# Patient Record
Sex: Male | Born: 1970 | Race: White | Hispanic: No | Marital: Single | Smoking: Current every day smoker
Health system: Southern US, Community
[De-identification: ages and names within clinical notes are randomized; demographics above are authoritative.]

## PROBLEM LIST (undated history)

## (undated) DIAGNOSIS — F32A Depression, unspecified: Secondary | ICD-10-CM

## (undated) DIAGNOSIS — F329 Major depressive disorder, single episode, unspecified: Secondary | ICD-10-CM

## (undated) DIAGNOSIS — I509 Heart failure, unspecified: Secondary | ICD-10-CM

## (undated) DIAGNOSIS — G4733 Obstructive sleep apnea (adult) (pediatric): Secondary | ICD-10-CM

## (undated) DIAGNOSIS — I2510 Atherosclerotic heart disease of native coronary artery without angina pectoris: Secondary | ICD-10-CM

## (undated) DIAGNOSIS — I10 Essential (primary) hypertension: Secondary | ICD-10-CM

## (undated) DIAGNOSIS — Z8679 Personal history of other diseases of the circulatory system: Secondary | ICD-10-CM

## (undated) DIAGNOSIS — K219 Gastro-esophageal reflux disease without esophagitis: Secondary | ICD-10-CM

## (undated) DIAGNOSIS — F419 Anxiety disorder, unspecified: Secondary | ICD-10-CM

## (undated) DIAGNOSIS — E785 Hyperlipidemia, unspecified: Secondary | ICD-10-CM

## (undated) HISTORY — DX: Heart failure, unspecified: I50.9

## (undated) HISTORY — DX: Depression, unspecified: F32.A

## (undated) HISTORY — PX: SHOULDER SURGERY: SHX246

## (undated) HISTORY — PX: NASAL TURBINATE REDUCTION: SHX2072

## (undated) HISTORY — DX: Major depressive disorder, single episode, unspecified: F32.9

## (undated) HISTORY — DX: Hyperlipidemia, unspecified: E78.5

## (undated) HISTORY — DX: Essential (primary) hypertension: I10

## (undated) HISTORY — DX: Gastro-esophageal reflux disease without esophagitis: K21.9

## (undated) HISTORY — DX: Obstructive sleep apnea (adult) (pediatric): G47.33

## (undated) HISTORY — DX: Personal history of other diseases of the circulatory system: Z86.79

## (undated) HISTORY — DX: Anxiety disorder, unspecified: F41.9

## (undated) HISTORY — DX: Depression, unspecified: F41.9

## (undated) HISTORY — PX: ANKLE SURGERY: SHX546

---

## 1998-07-07 ENCOUNTER — Ambulatory Visit (HOSPITAL_COMMUNITY): Admission: RE | Admit: 1998-07-07 | Discharge: 1998-07-07 | Payer: Self-pay | Admitting: Specialist

## 1998-07-07 ENCOUNTER — Encounter: Payer: Self-pay | Admitting: Specialist

## 2000-04-29 ENCOUNTER — Ambulatory Visit (HOSPITAL_COMMUNITY): Admission: RE | Admit: 2000-04-29 | Discharge: 2000-04-29 | Payer: Self-pay | Admitting: Internal Medicine

## 2000-05-14 ENCOUNTER — Ambulatory Visit (HOSPITAL_COMMUNITY): Admission: RE | Admit: 2000-05-14 | Discharge: 2000-05-14 | Payer: Self-pay | Admitting: Internal Medicine

## 2000-05-14 ENCOUNTER — Encounter: Payer: Self-pay | Admitting: Internal Medicine

## 2000-06-25 ENCOUNTER — Encounter: Payer: Self-pay | Admitting: Internal Medicine

## 2000-06-25 ENCOUNTER — Ambulatory Visit (HOSPITAL_COMMUNITY): Admission: RE | Admit: 2000-06-25 | Discharge: 2000-06-25 | Payer: Self-pay | Admitting: Internal Medicine

## 2000-10-25 ENCOUNTER — Emergency Department (HOSPITAL_COMMUNITY): Admission: EM | Admit: 2000-10-25 | Discharge: 2000-10-25 | Payer: Self-pay | Admitting: *Deleted

## 2000-10-25 ENCOUNTER — Encounter: Payer: Self-pay | Admitting: *Deleted

## 2000-11-24 ENCOUNTER — Emergency Department (HOSPITAL_COMMUNITY): Admission: EM | Admit: 2000-11-24 | Discharge: 2000-11-24 | Payer: Self-pay | Admitting: Emergency Medicine

## 2001-08-24 ENCOUNTER — Encounter: Payer: Self-pay | Admitting: Internal Medicine

## 2001-08-24 ENCOUNTER — Ambulatory Visit (HOSPITAL_COMMUNITY): Admission: RE | Admit: 2001-08-24 | Discharge: 2001-08-24 | Payer: Self-pay | Admitting: Internal Medicine

## 2001-08-27 ENCOUNTER — Ambulatory Visit (HOSPITAL_COMMUNITY): Admission: RE | Admit: 2001-08-27 | Discharge: 2001-08-27 | Payer: Self-pay | Admitting: Internal Medicine

## 2001-08-27 ENCOUNTER — Encounter: Payer: Self-pay | Admitting: Internal Medicine

## 2001-10-22 ENCOUNTER — Encounter: Payer: Self-pay | Admitting: Gastroenterology

## 2001-10-22 ENCOUNTER — Ambulatory Visit (HOSPITAL_COMMUNITY): Admission: RE | Admit: 2001-10-22 | Discharge: 2001-10-22 | Payer: Self-pay | Admitting: Gastroenterology

## 2002-04-20 ENCOUNTER — Ambulatory Visit (HOSPITAL_COMMUNITY): Admission: RE | Admit: 2002-04-20 | Discharge: 2002-04-20 | Payer: Self-pay | Admitting: Family Medicine

## 2002-04-20 ENCOUNTER — Encounter: Payer: Self-pay | Admitting: Family Medicine

## 2002-05-11 ENCOUNTER — Ambulatory Visit (HOSPITAL_COMMUNITY): Admission: RE | Admit: 2002-05-11 | Discharge: 2002-05-11 | Payer: Self-pay | Admitting: Family Medicine

## 2002-05-11 ENCOUNTER — Encounter: Payer: Self-pay | Admitting: Family Medicine

## 2002-06-22 ENCOUNTER — Encounter: Payer: Self-pay | Admitting: Emergency Medicine

## 2002-06-22 ENCOUNTER — Emergency Department (HOSPITAL_COMMUNITY): Admission: EM | Admit: 2002-06-22 | Discharge: 2002-06-22 | Payer: Self-pay | Admitting: Emergency Medicine

## 2002-11-26 ENCOUNTER — Emergency Department (HOSPITAL_COMMUNITY): Admission: EM | Admit: 2002-11-26 | Discharge: 2002-11-26 | Payer: Self-pay | Admitting: Emergency Medicine

## 2003-05-30 ENCOUNTER — Ambulatory Visit (HOSPITAL_COMMUNITY): Admission: RE | Admit: 2003-05-30 | Discharge: 2003-05-30 | Payer: Self-pay | Admitting: Internal Medicine

## 2003-06-01 ENCOUNTER — Ambulatory Visit (HOSPITAL_COMMUNITY): Admission: RE | Admit: 2003-06-01 | Discharge: 2003-06-01 | Payer: Self-pay | Admitting: Internal Medicine

## 2003-06-10 ENCOUNTER — Emergency Department (HOSPITAL_COMMUNITY): Admission: EM | Admit: 2003-06-10 | Discharge: 2003-06-10 | Payer: Self-pay | Admitting: Emergency Medicine

## 2003-11-11 ENCOUNTER — Emergency Department (HOSPITAL_COMMUNITY): Admission: EM | Admit: 2003-11-11 | Discharge: 2003-11-12 | Payer: Self-pay | Admitting: *Deleted

## 2004-02-04 ENCOUNTER — Encounter: Payer: Self-pay | Admitting: Emergency Medicine

## 2004-02-04 ENCOUNTER — Ambulatory Visit: Payer: Self-pay | Admitting: Cardiology

## 2004-02-04 ENCOUNTER — Inpatient Hospital Stay (HOSPITAL_COMMUNITY): Admission: AD | Admit: 2004-02-04 | Discharge: 2004-02-06 | Payer: Self-pay | Admitting: Cardiology

## 2004-02-15 ENCOUNTER — Ambulatory Visit: Payer: Self-pay | Admitting: Internal Medicine

## 2004-03-01 ENCOUNTER — Ambulatory Visit: Payer: Self-pay | Admitting: *Deleted

## 2004-03-20 ENCOUNTER — Ambulatory Visit: Payer: Self-pay | Admitting: Internal Medicine

## 2004-03-20 ENCOUNTER — Observation Stay (HOSPITAL_COMMUNITY): Admission: EM | Admit: 2004-03-20 | Discharge: 2004-03-21 | Payer: Self-pay | Admitting: Emergency Medicine

## 2004-03-21 ENCOUNTER — Encounter: Payer: Self-pay | Admitting: Cardiology

## 2004-03-28 ENCOUNTER — Ambulatory Visit: Payer: Self-pay | Admitting: Cardiology

## 2004-04-11 ENCOUNTER — Ambulatory Visit: Payer: Self-pay

## 2004-04-11 ENCOUNTER — Ambulatory Visit: Payer: Self-pay | Admitting: Internal Medicine

## 2004-04-21 ENCOUNTER — Ambulatory Visit: Admission: RE | Admit: 2004-04-21 | Discharge: 2004-04-21 | Payer: Self-pay | Admitting: Family Medicine

## 2004-04-23 ENCOUNTER — Ambulatory Visit: Payer: Self-pay | Admitting: Cardiology

## 2004-04-24 ENCOUNTER — Ambulatory Visit (HOSPITAL_COMMUNITY): Admission: RE | Admit: 2004-04-24 | Discharge: 2004-04-24 | Payer: Self-pay | Admitting: Internal Medicine

## 2004-04-24 ENCOUNTER — Encounter: Payer: Self-pay | Admitting: Cardiology

## 2004-05-02 ENCOUNTER — Ambulatory Visit: Payer: Self-pay | Admitting: *Deleted

## 2004-05-04 ENCOUNTER — Emergency Department (HOSPITAL_COMMUNITY): Admission: EM | Admit: 2004-05-04 | Discharge: 2004-05-04 | Payer: Self-pay | Admitting: Emergency Medicine

## 2004-05-10 ENCOUNTER — Ambulatory Visit: Payer: Self-pay | Admitting: Pulmonary Disease

## 2004-05-23 ENCOUNTER — Emergency Department (HOSPITAL_COMMUNITY): Admission: EM | Admit: 2004-05-23 | Discharge: 2004-05-23 | Payer: Self-pay | Admitting: Emergency Medicine

## 2004-05-24 ENCOUNTER — Emergency Department (HOSPITAL_COMMUNITY): Admission: EM | Admit: 2004-05-24 | Discharge: 2004-05-24 | Payer: Self-pay | Admitting: *Deleted

## 2004-05-25 ENCOUNTER — Emergency Department (HOSPITAL_COMMUNITY): Admission: EM | Admit: 2004-05-25 | Discharge: 2004-05-25 | Payer: Self-pay | Admitting: Emergency Medicine

## 2004-06-08 ENCOUNTER — Ambulatory Visit: Payer: Self-pay | Admitting: *Deleted

## 2004-06-11 ENCOUNTER — Ambulatory Visit (HOSPITAL_COMMUNITY): Admission: RE | Admit: 2004-06-11 | Discharge: 2004-06-11 | Payer: Self-pay | Admitting: *Deleted

## 2004-06-11 ENCOUNTER — Ambulatory Visit: Payer: Self-pay | Admitting: *Deleted

## 2004-06-19 ENCOUNTER — Ambulatory Visit: Payer: Self-pay | Admitting: *Deleted

## 2004-06-24 ENCOUNTER — Emergency Department (HOSPITAL_COMMUNITY): Admission: EM | Admit: 2004-06-24 | Discharge: 2004-06-24 | Payer: Self-pay | Admitting: Emergency Medicine

## 2004-06-26 ENCOUNTER — Emergency Department (HOSPITAL_COMMUNITY): Admission: EM | Admit: 2004-06-26 | Discharge: 2004-06-26 | Payer: Self-pay | Admitting: Emergency Medicine

## 2004-06-28 ENCOUNTER — Ambulatory Visit: Payer: Self-pay | Admitting: Critical Care Medicine

## 2004-07-12 ENCOUNTER — Ambulatory Visit: Payer: Self-pay | Admitting: Internal Medicine

## 2004-07-23 ENCOUNTER — Ambulatory Visit: Payer: Self-pay | Admitting: Pulmonary Disease

## 2004-08-01 ENCOUNTER — Ambulatory Visit (HOSPITAL_COMMUNITY): Admission: RE | Admit: 2004-08-01 | Discharge: 2004-08-02 | Payer: Self-pay | Admitting: Otolaryngology

## 2004-08-01 ENCOUNTER — Encounter (INDEPENDENT_AMBULATORY_CARE_PROVIDER_SITE_OTHER): Payer: Self-pay | Admitting: *Deleted

## 2004-11-30 ENCOUNTER — Emergency Department (HOSPITAL_COMMUNITY): Admission: EM | Admit: 2004-11-30 | Discharge: 2004-11-30 | Payer: Self-pay | Admitting: Emergency Medicine

## 2004-12-05 ENCOUNTER — Ambulatory Visit (HOSPITAL_COMMUNITY): Admission: RE | Admit: 2004-12-05 | Discharge: 2004-12-05 | Payer: Self-pay | Admitting: *Deleted

## 2004-12-05 ENCOUNTER — Ambulatory Visit: Payer: Self-pay | Admitting: Cardiology

## 2004-12-06 ENCOUNTER — Ambulatory Visit: Payer: Self-pay | Admitting: *Deleted

## 2005-01-15 ENCOUNTER — Ambulatory Visit: Payer: Self-pay | Admitting: Internal Medicine

## 2005-01-17 ENCOUNTER — Encounter (INDEPENDENT_AMBULATORY_CARE_PROVIDER_SITE_OTHER): Payer: Self-pay | Admitting: Internal Medicine

## 2005-01-17 ENCOUNTER — Ambulatory Visit: Payer: Self-pay | Admitting: Internal Medicine

## 2005-01-17 LAB — CONVERTED CEMR LAB: TSH: 1.14 microintl units/mL

## 2005-01-22 ENCOUNTER — Ambulatory Visit: Payer: Self-pay | Admitting: *Deleted

## 2005-01-22 ENCOUNTER — Ambulatory Visit (HOSPITAL_COMMUNITY): Admission: RE | Admit: 2005-01-22 | Discharge: 2005-01-22 | Payer: Self-pay | Admitting: Internal Medicine

## 2005-01-24 ENCOUNTER — Ambulatory Visit: Payer: Self-pay | Admitting: Critical Care Medicine

## 2005-02-01 ENCOUNTER — Ambulatory Visit: Payer: Self-pay | Admitting: *Deleted

## 2005-02-08 ENCOUNTER — Ambulatory Visit: Payer: Self-pay | Admitting: Internal Medicine

## 2005-02-18 ENCOUNTER — Encounter (INDEPENDENT_AMBULATORY_CARE_PROVIDER_SITE_OTHER): Payer: Self-pay | Admitting: Internal Medicine

## 2005-02-18 ENCOUNTER — Ambulatory Visit (HOSPITAL_COMMUNITY): Admission: RE | Admit: 2005-02-18 | Discharge: 2005-02-18 | Payer: Self-pay | Admitting: Internal Medicine

## 2005-03-01 ENCOUNTER — Ambulatory Visit: Admission: RE | Admit: 2005-03-01 | Discharge: 2005-03-01 | Payer: Self-pay | Admitting: Internal Medicine

## 2005-03-12 ENCOUNTER — Ambulatory Visit: Payer: Self-pay | Admitting: Pulmonary Disease

## 2005-04-08 ENCOUNTER — Emergency Department (HOSPITAL_COMMUNITY): Admission: EM | Admit: 2005-04-08 | Discharge: 2005-04-08 | Payer: Self-pay | Admitting: Emergency Medicine

## 2005-05-17 ENCOUNTER — Ambulatory Visit: Payer: Self-pay | Admitting: Internal Medicine

## 2005-07-26 ENCOUNTER — Ambulatory Visit: Payer: Self-pay | Admitting: Internal Medicine

## 2005-10-07 ENCOUNTER — Ambulatory Visit: Payer: Self-pay | Admitting: Internal Medicine

## 2005-10-18 ENCOUNTER — Emergency Department (HOSPITAL_COMMUNITY): Admission: EM | Admit: 2005-10-18 | Discharge: 2005-10-18 | Payer: Self-pay | Admitting: Emergency Medicine

## 2005-11-22 ENCOUNTER — Encounter: Payer: Self-pay | Admitting: Internal Medicine

## 2005-11-22 DIAGNOSIS — F319 Bipolar disorder, unspecified: Secondary | ICD-10-CM

## 2005-11-22 DIAGNOSIS — S82899A Other fracture of unspecified lower leg, initial encounter for closed fracture: Secondary | ICD-10-CM | POA: Insufficient documentation

## 2005-11-22 DIAGNOSIS — F329 Major depressive disorder, single episode, unspecified: Secondary | ICD-10-CM

## 2005-11-22 DIAGNOSIS — K259 Gastric ulcer, unspecified as acute or chronic, without hemorrhage or perforation: Secondary | ICD-10-CM | POA: Insufficient documentation

## 2005-11-22 DIAGNOSIS — R609 Edema, unspecified: Secondary | ICD-10-CM

## 2005-11-22 DIAGNOSIS — F3289 Other specified depressive episodes: Secondary | ICD-10-CM | POA: Insufficient documentation

## 2005-11-22 DIAGNOSIS — G43909 Migraine, unspecified, not intractable, without status migrainosus: Secondary | ICD-10-CM | POA: Insufficient documentation

## 2005-11-22 DIAGNOSIS — M129 Arthropathy, unspecified: Secondary | ICD-10-CM | POA: Insufficient documentation

## 2005-12-18 ENCOUNTER — Ambulatory Visit: Payer: Self-pay | Admitting: Orthopedic Surgery

## 2006-01-20 ENCOUNTER — Emergency Department (HOSPITAL_COMMUNITY): Admission: EM | Admit: 2006-01-20 | Discharge: 2006-01-21 | Payer: Self-pay | Admitting: Emergency Medicine

## 2006-01-23 ENCOUNTER — Ambulatory Visit: Payer: Self-pay | Admitting: Cardiology

## 2006-01-28 ENCOUNTER — Ambulatory Visit: Payer: Self-pay | Admitting: Cardiology

## 2006-02-19 ENCOUNTER — Ambulatory Visit: Payer: Self-pay | Admitting: Cardiology

## 2006-05-09 ENCOUNTER — Ambulatory Visit: Payer: Self-pay | Admitting: Cardiology

## 2006-08-29 ENCOUNTER — Emergency Department (HOSPITAL_COMMUNITY): Admission: EM | Admit: 2006-08-29 | Discharge: 2006-08-29 | Payer: Self-pay | Admitting: Emergency Medicine

## 2006-09-10 ENCOUNTER — Emergency Department (HOSPITAL_COMMUNITY): Admission: EM | Admit: 2006-09-10 | Discharge: 2006-09-11 | Payer: Self-pay | Admitting: Emergency Medicine

## 2006-09-11 ENCOUNTER — Ambulatory Visit: Payer: Self-pay | Admitting: *Deleted

## 2006-09-11 ENCOUNTER — Inpatient Hospital Stay (HOSPITAL_COMMUNITY): Admission: AD | Admit: 2006-09-11 | Discharge: 2006-09-12 | Payer: Self-pay | Admitting: *Deleted

## 2006-12-22 ENCOUNTER — Encounter: Payer: Self-pay | Admitting: Internal Medicine

## 2007-09-04 ENCOUNTER — Emergency Department (HOSPITAL_COMMUNITY): Admission: EM | Admit: 2007-09-04 | Discharge: 2007-09-04 | Payer: Self-pay | Admitting: Emergency Medicine

## 2008-10-13 ENCOUNTER — Encounter: Payer: Self-pay | Admitting: Orthopedic Surgery

## 2008-11-23 ENCOUNTER — Emergency Department: Payer: Self-pay | Admitting: Emergency Medicine

## 2009-03-25 ENCOUNTER — Emergency Department (HOSPITAL_COMMUNITY): Admission: EM | Admit: 2009-03-25 | Discharge: 2009-03-25 | Payer: Self-pay | Admitting: Emergency Medicine

## 2009-03-26 ENCOUNTER — Emergency Department (HOSPITAL_COMMUNITY): Admission: EM | Admit: 2009-03-26 | Discharge: 2009-03-26 | Payer: Self-pay | Admitting: Emergency Medicine

## 2009-03-28 ENCOUNTER — Emergency Department (HOSPITAL_COMMUNITY)
Admission: EM | Admit: 2009-03-28 | Discharge: 2009-03-28 | Payer: Self-pay | Source: Home / Self Care | Admitting: Emergency Medicine

## 2009-04-01 ENCOUNTER — Emergency Department: Payer: Self-pay | Admitting: Unknown Physician Specialty

## 2009-04-02 ENCOUNTER — Emergency Department (HOSPITAL_COMMUNITY): Admission: EM | Admit: 2009-04-02 | Discharge: 2009-04-02 | Payer: Self-pay | Admitting: Emergency Medicine

## 2009-06-18 ENCOUNTER — Emergency Department (HOSPITAL_COMMUNITY)
Admission: EM | Admit: 2009-06-18 | Discharge: 2009-06-18 | Payer: Self-pay | Source: Home / Self Care | Admitting: Emergency Medicine

## 2009-06-22 ENCOUNTER — Emergency Department: Payer: Self-pay | Admitting: Emergency Medicine

## 2009-11-07 ENCOUNTER — Encounter: Admission: RE | Admit: 2009-11-07 | Discharge: 2009-11-07 | Payer: Self-pay | Admitting: Orthopedic Surgery

## 2010-02-11 ENCOUNTER — Encounter: Payer: Self-pay | Admitting: Orthopedic Surgery

## 2010-02-19 ENCOUNTER — Encounter: Payer: Self-pay | Admitting: Orthopedic Surgery

## 2010-02-27 ENCOUNTER — Encounter: Payer: Self-pay | Admitting: Orthopedic Surgery

## 2010-02-27 ENCOUNTER — Ambulatory Visit (INDEPENDENT_AMBULATORY_CARE_PROVIDER_SITE_OTHER): Payer: Self-pay | Admitting: Orthopedic Surgery

## 2010-02-27 DIAGNOSIS — G5790 Unspecified mononeuropathy of unspecified lower limb: Secondary | ICD-10-CM | POA: Insufficient documentation

## 2010-03-02 ENCOUNTER — Encounter: Payer: Self-pay | Admitting: Orthopedic Surgery

## 2010-03-08 NOTE — Assessment & Plan Note (Signed)
Summary: PAIN W/"KNOT" RT LEG/NEEDS XRAY/APPR'D PER DR H/SEEN IN PREVI...   Vital Signs:  Patient profile:   40 year old male Height:      72 inches Weight:      228 pounds Pulse rate:   80 / minute Resp:     18 per minute  Vitals Entered By: Fuller Canada MD (February 27, 2010 1:49 PM)  Visit Type:  new patient Referring Provider:  self Primary Provider:  na  CC:  right knee pain.  History of Present Illness: I saw [name] in the office today for an initial visit.  He is a 40 years old man with the complaint of: RIGHT knee pain  DOI 08/31/09.  The patient gives a history of a workers compensation related injury to his RIGHT shoulder which required surgery by Dr. Ranell Patrick in [facility] back on the day listed.  He is employed at the [employer] company in [place].  He describes a 1000 pound roller falling on the back of his leg and pinning his knee against a tire of a truck.  He says he reported this at the time but it was not included in the workers compensation case.  He is represented by a law firm and a hearing is scheduled soon.  He was seen once a primary care for evaluation of his RIGHT knee where he is complaining of posterior popliteal space numbness tingling and pain which radiates into his calf and down towards the lower part of his leg.  He says he feels weakness in his knee by the end of the day and his knee will swell and the back as well.  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  He says that he does not have any back pain and the pain does not radiate past the popliteal space.  Tylenol does not help.  Note: He has pictures of his calf and knee area at the time of injury and he says that it was reported.  He has a history of heartburn otherwise review of systems are negative except for the tingling described.  He is currently on Nexium  He status post  RIGHT ankle reconstruction for instability after another injury approximately 10-12 years ago  He is currently divorced.  He smokes a pack of cigarettes a day does not drink does not use any street drugs and he completed his education through the 10th grade     Allergies: 1)  ! * Tricor 2)  ! Zocor  Past History:  Past Medical History: Last updated: 11/22/2005 Anxiety Congestive heart failure Depression GERD Hyperlipidemia Hypertension  Family History: Last updated: 12/22/2006 father-55 mother-51 sister-35 brother-37 brohter-30 daughter-x2  Social History: Last updated: 02/27/2010 divorced grading work Current Smoker-1ppd  no alcohol no caffeine 10th grade ed  Past Surgical History: CARDIAC CATH 2006 ABLATION 2006 VPPP  WITH TURBENATE REDUCTION 2006 rt shoulder rt ankle  Social History: divorced grading work Current Smoker-1ppd  no alcohol no caffeine 10th grade ed  Review of Systems Constitutional:  Denies weight loss, weight gain, fever, chills, and fatigue. Cardiovascular:  Denies chest pain, palpitations, fainting, and murmurs. Respiratory:  Denies short of breath, wheezing, couch, tightness, pain on inspiration, and snoring . Gastrointestinal:  Complains of heartburn; denies nausea, vomiting, diarrhea, constipation, and blood in your stools. Genitourinary:  Denies frequency, urgency, difficulty urinating, painful urination, flank pain, and bleeding in urine. Neurologic:  Complains of tingling; denies  numbness, unsteady gait, dizziness, tremors, and seizure. Musculoskeletal:  Denies joint pain, swelling, instability, stiffness, redness, heat, and muscle pain. Endocrine:  Denies excessive thirst, exessive urination, and heat or cold intolerance. Psychiatric:  Denies nervousness, depression, anxiety, and hallucinations. Skin:  Denies changes in the skin, poor healing, rash, itching, and redness. HEENT:  Denies blurred or double vision, eye pain,  redness, and watering. Immunology:  Denies seasonal allergies, sinus problems, and allergic to bee stings. Hemoatologic:  Denies easy bleeding and brusing.  Physical Exam  Skin:  lateral incision noted over the RIGHT ankle and foot from previous surgery has a positive Tinel's. Psych:  alert and cooperative; normal mood and affect; normal attention span and concentration   Knee Exam  General:    Well-developed, well-nourished, normal body habitus; no deformities, normal grooming.  Gait:    Normal heel-toe gait pattern bilaterally.    Skin:    Intact, no scars, lesions, rashes, cafe au lait spots, or bruising.    Inspection:     No deformity, ecchymosis or swelling.   Palpation:    he is tender to palpation in the popliteal space.  This does not involve the medial or lateral hamstring group.  Vascular:    There was no swelling or varicose veins. The pulses and temperature are normal. There was no edema or tenderness.  Sensory:    sensory changes are noted in the foot none in the calf or lateral leg  Motor:    Motor strength 5/5 bilaterally for quadriceps, hamstrings, ankle dorsiflexion, and ankle plantar flexion.    Reflexes:    Normal and symmetric patellar and Achilles reflexes bilaterally.    Knee Exam:    Right:    Inspection:  Normal    Palpation:  Abnormal    Stability:  stable    Tenderness:  popliteal space    he has difficulty straightening his leg when his hip is in 90 of flexion but it does not reproduce or radicular type pain just pain in the back of the calf    Range of Motion:       Flexion-Active: full    Left:    Inspection:  Normal    Palpation:  Normal    Range of Motion:       Flexion-Active: full  Anterior drawer:    Right negative Posterior drawer:    Right negative Lachman :    Right negative MCL:    Right negative LCL:    Right negative    Impression & Recommendations:  Problem # 1:  UNSPECIFIED MONONEURITIS OF LOWER LIMB  (ICD-355.8) Assessment New  I think the patient has had a neuropraxia to the tibial nerve and the popliteal space.  Recommend nerve conduction study to evaluate his tibial nerve.  Differential diagnosis would be lumbar disc disease causing leg pain although he has an atypical presentation.  The knee is stable so I do not think there is any joint pathology  Since this is I nerve injury I recommend that he get I nerve conduction study and see a neurologist as this would not be in the scope of my practice.  I did start him on some Neurontin titrate up to 100 mg 3 times a day to help him until he can see the neurologist.  I also told by would be happy to send his records to the [facility].  Orders: New Patient Level III ([id number])  Medications Added to Medication List This Visit: 1)  Neurontin 100  Mg Caps (Gabapentin) .Marland Kitchen.. 1 by mouth 1st day, 1 tablet two times a day 2nd day, one by mouth three times a day on 3rd day  Patient Instructions: 1)  No further appointment needed. 2)  fax notes to [facility] law firm  Prescriptions: NEURONTIN 100 MG CAPS (GABAPENTIN) 1 by mouth 1st day, 1 tablet two times a day 2nd day, one by mouth three times a day on 3rd day  #90 x 1   Entered and Authorized by:   Fuller Canada MD   Signed by:   Fuller Canada MD on 02/27/2010   Method used:   Print then Give to Patient   RxID:   [id number]    Orders Added: 1)  New Patient Level III [14782]

## 2010-03-14 NOTE — Letter (Signed)
Summary: Medical record request [facility]  Medical record request [employer]   Imported By: Cammie Sickle 03/09/2010 13:08:56  _____________________________________________________________________  External Attachment:    Type:   Image     Comment:   External Document

## 2010-03-14 NOTE — Letter (Signed)
Summary: History form  History form   Imported By: Jacklynn Ganong 03/09/2010 10:46:36  _____________________________________________________________________  External Attachment:    Type:   Image     Comment:   External Document

## 2010-04-15 LAB — DIFFERENTIAL
Basophils Absolute: 0 10*3/uL (ref 0.0–0.1)
Eosinophils Absolute: 0.1 10*3/uL (ref 0.0–0.7)
Eosinophils Relative: 2 % (ref 0–5)

## 2010-04-15 LAB — POCT I-STAT, CHEM 8
BUN: 9 mg/dL (ref 6–23)
Chloride: 107 mEq/L (ref 96–112)
Potassium: 4 mEq/L (ref 3.5–5.1)
Sodium: 140 mEq/L (ref 135–145)

## 2010-04-15 LAB — CBC
HCT: 46.4 % (ref 39.0–52.0)
MCV: 91.3 fL (ref 78.0–100.0)
Platelets: 237 10*3/uL (ref 150–400)
RDW: 13.9 % (ref 11.5–15.5)

## 2010-04-15 LAB — HEPATIC FUNCTION PANEL: Total Protein: 6.6 g/dL (ref 6.0–8.3)

## 2010-05-02 ENCOUNTER — Emergency Department (HOSPITAL_COMMUNITY)
Admission: EM | Admit: 2010-05-02 | Discharge: 2010-05-02 | Disposition: A | Discharge status: Home / Self Care | Payer: Self-pay | Attending: Emergency Medicine | Admitting: Emergency Medicine

## 2010-05-02 ENCOUNTER — Emergency Department (HOSPITAL_COMMUNITY): Payer: Self-pay

## 2010-05-02 DIAGNOSIS — IMO0002 Reserved for concepts with insufficient information to code with codable children: Secondary | ICD-10-CM | POA: Insufficient documentation

## 2010-05-02 DIAGNOSIS — Y92009 Unspecified place in unspecified non-institutional (private) residence as the place of occurrence of the external cause: Secondary | ICD-10-CM | POA: Insufficient documentation

## 2010-05-02 DIAGNOSIS — F319 Bipolar disorder, unspecified: Secondary | ICD-10-CM | POA: Insufficient documentation

## 2010-05-02 DIAGNOSIS — K219 Gastro-esophageal reflux disease without esophagitis: Secondary | ICD-10-CM | POA: Insufficient documentation

## 2010-05-02 DIAGNOSIS — S61209A Unspecified open wound of unspecified finger without damage to nail, initial encounter: Secondary | ICD-10-CM | POA: Insufficient documentation

## 2010-05-02 DIAGNOSIS — I10 Essential (primary) hypertension: Secondary | ICD-10-CM | POA: Insufficient documentation

## 2010-05-17 ENCOUNTER — Emergency Department (HOSPITAL_COMMUNITY): Payer: Self-pay

## 2010-05-17 ENCOUNTER — Emergency Department (HOSPITAL_COMMUNITY)
Admission: EM | Admit: 2010-05-17 | Discharge: 2010-05-18 | Disposition: A | Discharge status: Home / Self Care | Payer: Self-pay | Attending: Emergency Medicine | Admitting: Emergency Medicine

## 2010-05-17 DIAGNOSIS — R51 Headache: Secondary | ICD-10-CM | POA: Insufficient documentation

## 2010-05-17 DIAGNOSIS — F319 Bipolar disorder, unspecified: Secondary | ICD-10-CM | POA: Insufficient documentation

## 2010-05-17 DIAGNOSIS — R0602 Shortness of breath: Secondary | ICD-10-CM | POA: Insufficient documentation

## 2010-05-17 DIAGNOSIS — Z79899 Other long term (current) drug therapy: Secondary | ICD-10-CM | POA: Insufficient documentation

## 2010-05-17 DIAGNOSIS — R079 Chest pain, unspecified: Secondary | ICD-10-CM | POA: Insufficient documentation

## 2010-05-17 DIAGNOSIS — K219 Gastro-esophageal reflux disease without esophagitis: Secondary | ICD-10-CM | POA: Insufficient documentation

## 2010-05-17 DIAGNOSIS — I10 Essential (primary) hypertension: Secondary | ICD-10-CM | POA: Insufficient documentation

## 2010-05-17 DIAGNOSIS — R110 Nausea: Secondary | ICD-10-CM | POA: Insufficient documentation

## 2010-05-17 DIAGNOSIS — R209 Unspecified disturbances of skin sensation: Secondary | ICD-10-CM | POA: Insufficient documentation

## 2010-05-17 LAB — POCT CARDIAC MARKERS
CKMB, poc: <1 ng/mL — ABNORMAL LOW (ref 1.0–8.0)
CKMB, poc: <1 ng/mL — ABNORMAL LOW (ref 1.0–8.0)
Myoglobin, poc: 66.4 ng/mL (ref 12–200)
Troponin i, poc: <0.05 ng/mL (ref 0.00–0.09)

## 2010-05-17 LAB — BASIC METABOLIC PANEL
BUN: 9 mg/dL (ref 6–23)
GFR calc non Af Amer: >60 mL/min (ref >60)
Glucose, Bld: 105 mg/dL — ABNORMAL HIGH (ref 70–99)
Potassium: 3.5 mEq/L (ref 3.5–5.1)

## 2010-05-17 LAB — CBC
HCT: 44.9 % (ref 39.0–52.0)
MCV: 90.2 fL (ref 78.0–100.0)
Platelets: 260 10*3/uL (ref 150–400)
RBC: 4.98 MIL/uL (ref 4.22–5.81)
WBC: 9.7 10*3/uL (ref 4.0–10.5)

## 2010-05-21 ENCOUNTER — Encounter: Payer: Self-pay | Admitting: Adult Health

## 2010-05-21 ENCOUNTER — Ambulatory Visit (INDEPENDENT_AMBULATORY_CARE_PROVIDER_SITE_OTHER): Payer: Self-pay | Admitting: Adult Health

## 2010-05-21 DIAGNOSIS — R0789 Other chest pain: Secondary | ICD-10-CM

## 2010-05-21 DIAGNOSIS — R079 Chest pain, unspecified: Secondary | ICD-10-CM | POA: Insufficient documentation

## 2010-05-21 DIAGNOSIS — I10 Essential (primary) hypertension: Secondary | ICD-10-CM

## 2010-05-21 DIAGNOSIS — E785 Hyperlipidemia, unspecified: Secondary | ICD-10-CM

## 2010-05-21 NOTE — Assessment & Plan Note (Signed)
He has not been studied since 2006 cardiac catheterization demonstrating normal coronary arteries at that time. EF was 36% secondary to tachycardia.  Repeat echo 2007 demonstrated normal EF of 55%.  I doubt pain is cardiac in etiology, but will proceed with stress test and repeat echo for LV fx in the setting of hypertension. He continues to smoke and CVRF are moderately controlled. Cannot tolerate statins.  Will check his lipids and CMET.   He is on metoprolol 100 mg daily. He will hold this prior to stress test.  If negative would consider GI work-up as his chest pain occurred while lying down to r/o GERD, esophageal spasms.

## 2010-05-21 NOTE — Assessment & Plan Note (Signed)
Cannot tolerate statins.  May need to consider adding Zetia and fish oil.  Will await lab results.

## 2010-05-21 NOTE — Progress Notes (Signed)
HPI: [name] is a 40 y/o CM with known history of PSVT s/p ablation per Dr. Graciela Husbands in 2006.  Hypertension, hypercholesterolemia 9not tolerant of statins, ongoing tobacco abuse, sleep apnea with intermittent use of CPAP, who we have not seen since 2008.  He has been doing well but was seen in the ER on 05/18/2010 for chest pain, severe pressure with radiation to his left shoulder, left neck, and pain down his left arm, with tingling in his left hand. He had low grade nausea without vomiting.  He has two sets of cardiac markers and they were found to be normal.  His pain was relieved with NTG.  He also complained of a HA after NTG and had a CT scan of his head demonstrating no unremarkable result.  He states the pain was 8/10 and occurred at rest, while watching TV.  He comes today at the request of his girlfriend who works for a cardiologist in [place].  He has had no symptoms since the ER visit. He states he is more tired lately, walking causes him to feel more fatigued than usual.  He states it feel like he has walked miles when he only walks a few feet.  Denies dizziness or syncope.  Allergies  Allergen Reactions  . Fenofibrate   . Simvastatin     Current Outpatient Prescriptions  Medication Sig Dispense Refill  . esomeprazole (NEXIUM) 40 MG capsule Take 40 mg by mouth daily before breakfast.        . metoprolol (TOPROL-XL) 100 MG 24 hr tablet Take 100 mg by mouth daily.          Past Medical History  Diagnosis Date  . Hypertension   . History of PSVT (paroxysmal supraventricular tachycardia)     s/p ablation 2006.  Marland Kitchen Hyperlipidemia     Intolerant to statins causing tongue swelling  . Sleep apnea     Wears CPAP intermittantly. Followed by Dr. Gildardo Cranker Pulmonology.    History reviewed. No pertinent past surgical history.  HYQ:MVHQIO of systems complete and found to be negative unless listed above PHYSICAL EXAM BP 142/86  Pulse 82  Ht 6' (1.829 m)  Wt 241 lb (109.317 kg)   BMI 32.69 kg/m2  SpO2 94% General: Well developed, well nourished, in no acute distress Head: Eyes PERRLA, No xanthomas.   Normal cephalic and atramatic  Lungs: Clear bilaterally to auscultation and percussion. Heart: HRRR S1 S2..  Pulses are 2+ & equal.            No carotid bruit. No JVD.  No abdominal bruits. No femoral bruits. Abdomen: Bowel sounds are positive, abdomen soft and non-tender without masses or                  Hernia's noted. Negative Murphy's sign. Msk:  Back normal, normal gait. Normal strength and tone for age. Extremities: No clubbing, cyanosis or edema.  DP +1 Neuro: Alert and oriented X 3. Psych:  Good affect, responds appropriately EKG:NSR rate of 77 bpm. Incomplete RBBB. No evidence of ischemia.  ASSESSMENT AND PLAN

## 2010-05-21 NOTE — Patient Instructions (Signed)
Your physician has requested that you have an echocardiogram. Echocardiography is a painless test that uses sound waves to create images of your heart. It provides your doctor with information about the size and shape of your heart and how well your heart's chambers and valves are working. This procedure takes approximately one hour. There are no restrictions for this procedure.  Your physician has requested that you have en exercise stress myoview. For further information please visit [email or web address] Please follow instruction sheet, as given.  Your physician recommends that you return for lab work in: This week, please do not eat or drink after midnight the night before labs are drawn.  Your physician recommends that you schedule a follow-up appointment in: after tests

## 2010-05-21 NOTE — Assessment & Plan Note (Signed)
Moderately controlled. Will adjust medications if remains elevated on next visit after stress test.

## 2010-05-22 ENCOUNTER — Other Ambulatory Visit: Payer: Self-pay | Admitting: Adult Health

## 2010-05-23 ENCOUNTER — Other Ambulatory Visit: Payer: Self-pay | Admitting: Cardiology

## 2010-05-23 LAB — LIPID PANEL
Cholesterol: 240 mg/dL — ABNORMAL HIGH (ref 0–200)
HDL: 34 mg/dL — ABNORMAL LOW (ref >39)
Total CHOL/HDL Ratio: 7.1 Ratio
Triglycerides: 364 mg/dL — ABNORMAL HIGH (ref <150)
VLDL: 73 mg/dL — ABNORMAL HIGH (ref 0–40)

## 2010-05-23 LAB — COMPREHENSIVE METABOLIC PANEL
ALT: 22 U/L (ref 0–53)
BUN: 11 mg/dL (ref 6–23)
CO2: 24 mEq/L (ref 19–32)
Creat: 0.99 mg/dL (ref 0.40–1.50)
Total Bilirubin: 0.4 mg/dL (ref 0.3–1.2)

## 2010-05-23 LAB — CBC WITH DIFFERENTIAL/PLATELET
Basophils Relative: 0 % (ref 0–1)
Eosinophils Absolute: 0.2 10*3/uL (ref 0.0–0.7)
MCH: 31 pg (ref 26.0–34.0)
MCHC: 33.1 g/dL (ref 30.0–36.0)
Neutrophils Relative %: 56 % (ref 43–77)
Platelets: 281 10*3/uL (ref 150–400)
RDW: 14.6 % (ref 11.5–15.5)

## 2010-05-24 ENCOUNTER — Telehealth: Payer: Self-pay | Admitting: Adult Health

## 2010-05-24 NOTE — Telephone Encounter (Signed)
PT CALLING FOR LAB RESULTS

## 2010-05-25 ENCOUNTER — Telehealth: Payer: Self-pay

## 2010-05-25 ENCOUNTER — Telehealth: Payer: Self-pay | Admitting: Adult Health

## 2010-05-25 MED ORDER — EZETIMIBE 10 MG PO TABS: 10.0000 mg | ORAL_TABLET | Freq: Every day | ORAL | Status: DC

## 2010-05-25 NOTE — Telephone Encounter (Signed)
**Note De-Identified Edynn Gillock Obfuscation** Message copied by Waynette Buttery on Fri May 25, 2010  9:43 AM ------      Message from: Joni Reining      Created: Thu May 24, 2010  4:26 PM       Begin him on Zetia 10mg  daily and Fish Oil 1000mg  daily.  Send low cholesterol diet.            Joni Reining NP

## 2010-05-25 NOTE — Telephone Encounter (Signed)
rx was called in on pt to [facility] for zetia he is a self pay patient and needs a cheaper drug. Pt was told by pharmacy that this is one of the highest payment wise cholesterol drugs

## 2010-05-28 ENCOUNTER — Telehealth: Payer: Self-pay

## 2010-05-30 ENCOUNTER — Telehealth: Payer: Self-pay

## 2010-05-30 ENCOUNTER — Ambulatory Visit (HOSPITAL_COMMUNITY)
Admission: RE | Admit: 2010-05-30 | Discharge: 2010-05-30 | Disposition: A | Discharge status: Home / Self Care | Payer: Self-pay | Source: Ambulatory Visit | Attending: Cardiology | Admitting: Cardiology

## 2010-05-30 ENCOUNTER — Encounter: Payer: Self-pay | Admitting: Cardiology

## 2010-05-30 ENCOUNTER — Encounter (HOSPITAL_COMMUNITY): Payer: Self-pay

## 2010-05-30 ENCOUNTER — Ambulatory Visit (INDEPENDENT_AMBULATORY_CARE_PROVIDER_SITE_OTHER): Payer: Self-pay | Admitting: *Deleted

## 2010-05-30 ENCOUNTER — Encounter (HOSPITAL_COMMUNITY)
Admission: RE | Admit: 2010-05-30 | Discharge: 2010-05-30 | Disposition: A | Discharge status: Home / Self Care | Payer: Self-pay | Source: Ambulatory Visit | Attending: Cardiology | Admitting: Cardiology

## 2010-05-30 DIAGNOSIS — I10 Essential (primary) hypertension: Secondary | ICD-10-CM | POA: Insufficient documentation

## 2010-05-30 DIAGNOSIS — R079 Chest pain, unspecified: Secondary | ICD-10-CM

## 2010-05-30 DIAGNOSIS — R072 Precordial pain: Secondary | ICD-10-CM

## 2010-05-30 MED ORDER — EZETIMIBE 10 MG PO TABS: 10.0000 mg | ORAL_TABLET | Freq: Every day | ORAL | Status: DC

## 2010-05-30 MED ORDER — TECHNETIUM TC 99M TETROFOSMIN IV KIT
10.0000 | PACK | Freq: Once | INTRAVENOUS | Status: AC | PRN
  Administered 2010-05-30: 9.2 via INTRAVENOUS

## 2010-05-30 MED ORDER — TECHNETIUM TC 99M TETROFOSMIN IV KIT
30.0000 | PACK | Freq: Once | INTRAVENOUS | Status: AC | PRN
  Administered 2010-05-30: 29.2 via INTRAVENOUS

## 2010-05-30 NOTE — Progress Notes (Deleted)

## 2010-05-31 NOTE — Progress Notes (Signed)
Please see dictated report for Stress Nuclear Study.   

## 2010-06-01 ENCOUNTER — Telehealth: Payer: Self-pay | Admitting: *Deleted

## 2010-06-01 ENCOUNTER — Telehealth: Payer: Self-pay

## 2010-06-01 NOTE — Telephone Encounter (Signed)
Results called to pt, verbalized understanding 

## 2010-06-01 NOTE — Telephone Encounter (Signed)
Message copied by Teressa Lower on Fri Jun 01, 2010  3:30 PM ------      Message from: Hemphill Bing      Created: Thu May 31, 2010  7:13 PM       Results were reviewed; no action required.

## 2010-06-05 ENCOUNTER — Encounter: Payer: Self-pay | Admitting: Adult Health

## 2010-06-05 NOTE — Progress Notes (Signed)
No Show
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
## 2010-06-07 ENCOUNTER — Ambulatory Visit (INDEPENDENT_AMBULATORY_CARE_PROVIDER_SITE_OTHER): Payer: Self-pay | Admitting: Adult Health

## 2010-06-07 ENCOUNTER — Encounter: Payer: Self-pay | Admitting: Adult Health

## 2010-06-07 DIAGNOSIS — R079 Chest pain, unspecified: Secondary | ICD-10-CM

## 2010-06-07 DIAGNOSIS — I498 Other specified cardiac arrhythmias: Secondary | ICD-10-CM

## 2010-06-07 NOTE — Patient Instructions (Signed)
Your physician recommends that you continue on your current medications as directed. Please refer to the Current Medication list given to you today.  Your physician recommends that you schedule a follow-up appointment in: 1 year  

## 2010-06-07 NOTE — Assessment & Plan Note (Signed)
No recurrence of symptoms since ablation,  Continue metoprolol as directed.

## 2010-06-07 NOTE — Progress Notes (Signed)
HPI : [name] comes today for follow up after having had a stress test and echocardiogram with known history of PSVT s/p ablation by Dr. Graciela Husbands in 2006. Also history of hypertension, hypercholesterolemia, but is not tolerant to statins.  He was seen recently in the ER on 05/18/2010 with complaints of chest pressure. He was ruled out for MI. When I saw him last he was scheduled for diagnostic testing to evaluate for ischemia with multiple CVRF.  He continues to have chest pressure on occasion, some with exertion.    Allergies  Allergen Reactions  . Fenofibrate   . Simvastatin     Current Outpatient Prescriptions  Medication Sig Dispense Refill  . esomeprazole (NEXIUM) 40 MG capsule Take 40 mg by mouth daily before breakfast.        . metoprolol (TOPROL-XL) 100 MG 24 hr tablet Take 100 mg by mouth daily.        . Omega-3 Fatty Acids (FISH OIL) 1000 MG CAPS Take 1,000 capsules by mouth daily.          Past Medical History  Diagnosis Date  . Hypertension   . History of PSVT (paroxysmal supraventricular tachycardia)     s/p ablation 2006.  Marland Kitchen Hyperlipidemia     Intolerant to statins causing tongue swelling  . Sleep apnea     Wears CPAP intermittantly. Followed by Dr. Gildardo Cranker Pulmonology.    No past surgical history on file.  EPP:IRJJOA of systems complete and found to be negative unless listed above PHYSICAL EXAM BP 120/80  Pulse 86  Ht 6' (1.829 m)  Wt 240 lb (108.863 kg)  BMI 32.55 kg/m2  SpO2 97% General: Well developed, well nourished, in no acute distress Head: Eyes PERRLA, No xanthomas.   Normal cephalic and atramatic  Lungs: Clear bilaterally to auscultation and percussion. Heart: HRRR S1 S2, with soft S4 murmur.  Pulses are 2+ & equal.            No carotid bruit. No JVD.  No abdominal bruits. No femoral bruits. Extremities: No clubbing, cyanosis or edema.  DP +1 Neuro: Alert and oriented X 3. Psych:  Good affect, responds appropriately  ASSESSMENT AND PLAN

## 2010-06-07 NOTE — Assessment & Plan Note (Signed)
He completed stress test and echocardiogram on May 9,2012. Echo was normal with EF of 50-55%. No WMA.   Stress test demonstrated baseline EKG normal,there were no symptoms to suggest that myocardial ischemia induced by exercise. Scintigraphic imaging somewhat patchy uptake of tracer with no convincing evidence for infarction was found.  He is given reassurance of his symptoms from a cardiac standpoint.  He is advised to quit smoking and follow up with his primary care physician for other etiologies of chest pressure.

## 2010-06-08 NOTE — Discharge Summary (Signed)
[name], [name]                ACCOUNT NO.:  [id number]   MEDICAL RECORD NO.:  [id number]          PATIENT TYPE:  OIB   LOCATION:  4731                         FACILITY:  [facility]   PHYSICIAN:  Maple Mirza, P.A. DATE OF BIRTH:  08-10-1970   DATE OF ADMISSION:  04/24/2004  DATE OF DISCHARGE:                                 DISCHARGE SUMMARY   ADDENDUM   FOLLOW UP VISITS WITH CARDIOLOGY:  He is going to see Dr. Dorethea Clan on  Wednesday, May 02, 2004, at 12:45 p.m., this visit primarily to see if we  can up-titrate his Coreg from 25 mg b.i.d. to 50 mg b.i.d.  At that time,  also, the echocardiogram, which will be scheduled for sometime in July prior  to his visit with Dr. Dorethea Clan on Wednesday, August 01, 2004, will be scheduled  with [name].  Because [name] has an appointment with Dr. Dorethea Clan on  July 12th, we would like an echocardiogram to be done somewhat prior to that  so that this can be discussed at the office visit.  Also, he has a  prescription for Percocet 5/325, 1 to 2 tabs every 4 to 6 hours as needed  for pain, and prior to discharge a 2-D echocardiogram was obtained.  The  results are pending at this time, but I will make a special addendum if  there is a pericardial effusion, and of course if there is, status post his  ablation today, April 4th, then he will stay and further discharge summary  will be made.  If no pericardial effusion, there will be no addendum to this  discharge.      GM/MEDQ  D:  04/24/2004  T:  04/24/2004  Job:  [id number]   cc:   Vida Roller, M.D.  Fax: [phone]   Melvyn Novas, MD  [phone] [place]  [place] [address]  Fax: [phone]

## 2010-06-08 NOTE — Procedures (Signed)
[name], [name]                ACCOUNT NO.:  [id number]   MEDICAL RECORD NO.:  [id number]          PATIENT TYPE:  OUT   LOCATION:  RAD                           FACILITY:  [facility]   PHYSICIAN:  Vida Roller, M.D.   DATE OF BIRTH:  [birth year]   DATE OF PROCEDURE:  06/11/2004  DATE OF DISCHARGE:                                  ECHOCARDIOGRAM   PRIMARY CARE PHYSICIAN:  Melvyn Novas, MD  Tape Number [id number], tape count [phone] to [id number].   This is a 40 year old man with palpitations and hypertension. Technical  quality of the study is adequate.   M-MODE TRACINGS:  The aorta is 36 mm.   Left atrium 45 mm.   Septum is 13 mm.   Posterior wall 12 mm.   Left ventricular diastolic dimension is 49 mm.   Left ventricular systolic dimension is 37 mm.   2-D AND DOPPLER IMAGING:  The left ventricle is normal size with mild  concentric left ventricular hypertrophy. There are no obvious wall motion  abnormalities. The ejection fraction estimated at 50 to 55%.   The right ventricle is top normal in size with normal ejection fraction.  There is no obvious right ventricular free wall thickening.   Both atria appear to be enlarged, left greater than right.   The aortic valve is morphologically unremarkable with no stenosis or  regurgitation.   The mitral valve is morphologically unremarkable with no stenosis or  regurgitation.   Tricuspid valve with trace insufficiency. No stenosis.   Pulmonic valve has trace insufficiency.   There is no pericardial effusion.   The aorta was not well seen.   The inferior vena cava appears to be normal size.      JH/MEDQ  D:  06/11/2004  T:  06/11/2004  Job:  [id number]   cc:   Melvyn Novas, MD  [address] [place]  [place] [address]  Fax: [phone]

## 2010-06-08 NOTE — Cardiovascular Report (Signed)
NAMESHOICHI, MIELKE                ACCOUNT NO.:  [id number]   MEDICAL RECORD NO.:  [id number]          PATIENT TYPE:  INP   LOCATION:  4702                         FACILITY:  [facility]   PHYSICIAN:  Salvadore Farber, M.D. LHCDATE OF BIRTH:  1970-08-31   DATE OF PROCEDURE:  02/06/2004  DATE OF DISCHARGE:                              CARDIAC CATHETERIZATION   PROCEDURE:  Left heart catheterization, left ventriculography, coronary  angiography.   INDICATIONS:  Mr. Jankowski is a 40 year old gentleman with cardiac risk factors  of hypertension, dyslipidemia, markedly positive family history, and ongoing  tobacco abuse. He presented on February 04, 2004,  with sudden onset 8/10  midsternal chest discomfort associated with shortness of breath and  diaphoresis. Pain resolved spontaneously.  He was hospitalized and ruled out  for myocardial infarction by serial enzymes, electrocardiograms. While in  hospital, he was noted to have a supraventricular tachycardia at 170 beats  per minute. This reverted spontaneously. Given his typical symptoms and  multiple risk factors, he is referred for diagnostic angiography.   PROCEDURAL TECHNIQUE:  Informed consent was obtained. After 1% lidocaine  local anesthesia, a 5-French sheath was placed in the right common femoral  artery. Diagnostic angiography and ventriculography were performed using JL-  4, JR-4, and pigtail catheters. The patient tolerated the procedure well and  was transferred to the holding room in stable condition. Sheaths are removed  there.   COMPLICATIONS:  None.   FINDINGS:  1.  LV: 100/10/16. EF 38% with diffuse hypokinesis.  2.  No aortic stenosis or mitral regurgitation.  3.  Left main: Angiographically normal.  4.  LAD: Large vessel giving rise to a single large diagonal. It is      angiographically normal.  5.  Circumflex: Relatively large vessel giving rise to two large marginals.      It is angiographically normal.  6.  RCA:  Large, dominant vessel. It is angiographically normal.   IMPRESSION/RECOMMENDATIONS:  1.  Angiographically normal coronary arteries.  2.  No aortic stenosis or mitral regurgitation.  3.  Ejection fraction 38%, likely secondary to tachycardia.   The patient will be discharged home today on beta blockers for his ST-T. 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 We will have him  follow-up with Dr. Graciela Husbands for consideration of ablation.      WED/MEDQ  D:  02/06/2004  T:  02/06/2004  Job:  [id number]   cc:   Delbert Harness, MD   Vida Roller, M.D.  Fax: [phone]

## 2010-06-08 NOTE — Group Therapy Note (Signed)
[name], [name]                ACCOUNT NO.:  [id number]   MEDICAL RECORD NO.:  [id number]          PATIENT TYPE:  EMS   LOCATION:  ED                            FACILITY:  [facility]   PHYSICIAN:  Mila Homer. Sudie Bailey, M.D.DATE OF BIRTH:  05-06-1970   DATE OF PROCEDURE:  DATE OF DISCHARGE:                                   PROGRESS NOTE   SUBJECTIVE:  This patient was seen in the [facility] emergency room.  His  LMD is Dr. Delbert Harness, and I am covering for him this Saturday.  The patient  had the sudden onset of a shortness of breath, pressure in the chest  associated with tachycardia and palpitations.  Apparently he has had one  other case of supraventricular tachycardia.  He tells me he was told he had  hardening of the arteries by [facility] in the past.  He has had a  problem with hypercholesterolemia, and according to the patient and the wife  his cholesterols have been terribly hard to control, and he has reacted to  at least one of the medicines that has been used.  His last triglycerides  were 800.  He commonly smokes two packs of cigarettes a day.  He has a  paternal uncle who died at age 11 of coronary artery disease, and his dad is  alive but had his first MI at age 22.   CURRENT MEDICATIONS:  Include Nexium and Crestor.   He said that the chest discomfort he had was really unusual.  It felt like a  bubble and also felt like an unusual-type pressure that involved his whole  anterior chest.  This discomfort cleared when his heart rate came down, and  in fact by the time he came to the emergency room his heart rate had come  down substantially.   VITAL SIGNS:  Admission temperature 98.2, blood pressure 130/100 which  eventually dropped down to 126/74.  His pulse was 184 but within a minute  dropped to 81.  The respiratory rate was 32.  GENERAL APPEARANCE:  He was oriented and alert, in no acute distress at the  time I examined him 2-1/2 hours after his admission to  the emergency room.  He is well developed and well nourished.  HEART:  Regular rhythm and rate of about 70.  No murmurs.  LUNGS:  Clear throughout.  Moving air well.  ABDOMEN:  Soft.  There was no edema of the ankles.   Blood work showed a white cell count of 7600, H&H of 17.8/50.4.  MCV 87.  Platelet count 298,000.  He had a normal differential.  Met-7 showed a  potassium of 3.5, sodium 138, BUN 13, creatinine 1.1.  Beta natriuretic  peptide was less than 30.  His MB was less than 1 mg/ml and troponin less  than 0.05 mg/ml.   ASSESSMENT:  1.  Chest pain/pressure related to tachycardia.  2.  Supraventricular tachycardia.  3.  Mixed hypercholesterolemia (272.2).  4.  Tobacco use disorder (305.1).  5.  Positive family history for coronary artery disease including sudden  death in his uncle (autopsy proved) and MI in his dad.   PLAN:  I discussed it with the patient and wife present at length.  I  certainly think it is safe for him to be transferred down to [facility] for a full evaluation by cardiology, given all the above.  I  reviewed his EKG, which at present is fairly normal.   Note I was called to the ER by the ER physician for further evaluation of  this patient.      SDK/MEDQ  D:  02/04/2004  T:  02/04/2004  Job:  [id number]   cc:   Delbert Harness, MD

## 2010-06-08 NOTE — Discharge Summary (Signed)
[name], [name]                ACCOUNT NO.:  [id number]   MEDICAL RECORD NO.:  [id number]          PATIENT TYPE:  OIB   LOCATION:  4731                         FACILITY:  [facility]   PHYSICIAN:  Duke Salvia, M.D.  DATE OF BIRTH:  10-05-1970   DATE OF ADMISSION:  04/24/2004  DATE OF DISCHARGE:                                 DISCHARGE SUMMARY   ADDENDUM:  Add to report number [id number].   LABORATORY DATA:  Complete blood count:  White cells 9.4, hemoglobin 16.3,  hematocrit 45.1, platelets 296.  PT was 11.7, INR 0.8.  Serum electrolytes:  Sodium 135, potassium 4.1, chloride 104, bicarbonate 26, glucose 83, BUN 9,  creatinine 0.9.      GM/MEDQ  D:  04/24/2004  T:  04/24/2004  Job:  [id number]   cc:   Vida Roller, M.D.  Fax: [phone]   Delbert Harness, MD

## 2010-06-08 NOTE — Op Note (Signed)
[name], [name]                ACCOUNT NO.:  [id number]   MEDICAL RECORD NO.:  [id number]          PATIENT TYPE:  OIB   LOCATION:  2899                         FACILITY:  [facility]   PHYSICIAN:  Suzanna Obey, M.D.       DATE OF BIRTH:  [birth year]   DATE OF PROCEDURE:  08/01/2004  DATE OF DISCHARGE:                                 OPERATIVE REPORT   PREOPERATIVE DIAGNOSIS:  Obstructive sleep apnea and deviated septum with  turbinate hypertrophy.   POSTOPERATIVE DIAGNOSIS:  Obstructive sleep apnea and deviated septum with  turbinate hypertrophy.   SURGICAL PROCEDURE:  Septoplasty, submucous resection of inferior  turbinates, uvulopharyngopalatoplasty, and tonsillectomy.   ANESTHESIA:  General endotracheal tube.   ESTIMATED BLOOD LOSS:  Approximately 10 cc.   INDICATIONS:  This is a 40 year old who has had problems with obstructed  breathing and problems with daytime somnolence.  He did undergo a sleep  study in which he did have some obstructive sleep apnea.  He has a lot of  nasal obstruction and congestion problems.  He definitely feels like he  could not tolerate the CPAP.  He was discussed regarding this treatment, and  as it is the best treatment he wanted to proceed with the surgery, get the  nasal obstruction fixed, and wanted to do the tonsils and uvula at the same  time.  He understands the risks and benefits to be bleeding, infection,  scarring of the nose, septal perforation, chronic crusting and drying,  change in the external appearance of his nose, velopharyngeal insufficiency,  changes in voice, chronic pain, and risks of the anesthetic.  All questions  were answered, and consent was obtained.   OPERATION:  The patient was taken to the operating room and placed in the  supine position.  After adequate general endotracheal tube anesthesia, he  was prepped and draped in the usual sterile manner.  Oxymetazoline pledgets  were placed into the nose bilaterally, and the  septum was injected with 1%  lidocaine with 1:100,000 epinephrine including the inferior turbinates.  Right hemitransfixion incision was performed, raising a mucoperichondrial  and osteal flap.  The cartilage was divided about 2 cm posterior to the  caudal strut, and the cartilage was elevated and removed with the Market researcher and the [employer] forceps.  The flap was elevated more posterior,  and the superior aspects of the bone were removed with the Jansen-Middleton  forceps.  The inferior spur which was very large was removed with a 4 mm  osteotome.  This corrected the septal deflection.  The turbinates were  infractured.  Midline incision made with a 15 blade.  Mucosal flap elevated  superiorly, and the inferior mucosa and bone were removed with the turbinate  scissors.  The edges were cauterized with suction cautery, and the flaps  were laid back down over the raw surface, and both turbinates were  outfractured.  Hemitransfixion closed with interrupted 4-0 chromic, and a 4-  0 plain gut placed through the septum.  Telfa rolls soaked in bacitracin  were placed into the nose bilaterally and secured with  3-0 nylon.  The oral  cavity was then addressed, where the Crowe-Davis mouth gag was inserted,  retracted, and suspended from the Mayo stand.  The palate was checked.  There did not seem to be any shortness of the palate.  The incision was then  made with electrocautery right at the base of the uvula, carrying it into  the tonsillar fossas bilaterally.  The tonsils were removed along the  capsule with electrocautery dissection, carrying the dissection across the  base of the uvula into the right side, where again the tonsil was removed  with electrocautery dissection along its capsule.  The tonsils and uvula  were removed en bloc.  The suction cautery was used to obtain hemostasis.  Interrupted 3-0 Vicryl was used to close the palate and tonsillar  fossas.  Hypopharynx, esophagus, and  stomach were suctioned with an NG tube.  Crowe-Davis was released and resuspended.  There was hemostasis present in  all locations.  The patient was awakened and brought to the recovery room in  stable condition.  Counts correct.       JB/MEDQ  D:  08/01/2004  T:  08/01/2004  Job:  [id number]   cc:   Delbert Harness, MD   Duke Salvia, M.D.

## 2010-06-08 NOTE — Letter (Signed)
January 23, 2006    Melvyn Novas, MD  [address] [place], [place] [address]   RE:  [name], [name]  MRN:  [id number]  /  DOB:  12-27-1970   Dear Gerlene Burdock:   [name] returned to the office to be seen at his request.  As you  know, he was previously a patient of Dr. Marchelle Folks and was initially  evaluated for PSVT that appeared to be cured after radiofrequency  ablation in April of 2006.  He was seen in the emergency department 4  days ago for what he described as abdominal pain at that time, but now  reports as flank pain.  A CT scan of the abdomen was performed and was  negative.  He was treated for constipation.  He also reports sharp  episodes of left chest discomfort of moderate severity that occur  unpredictably and last for a number of minutes.  There is no associated  chest wall tenderness.  There is no pleuritic component.  There is no  associated dyspnea nor diaphoresis.  There is no radiation.  He thinks  this pain is different than the symptoms that he had when he first  presented with his SVT, although he does note some palpitations at  times.  He apparently had similar symptoms in the past that Dr. Dorethea Clan  treated with sublingual nitroglycerin with success.   The patient's flank discomfort is persistent.  There is chest wall  tenderness in that area as well as a pleuritic component.   [name] has unfortunately lost his job and appears depressed.  He  reports that he continues to be active, walking in the woods in  conjunction with hunting and walking a few miles approximately 3 times  per week.   Current medications include Lamictal 200 mg daily, Nexium 40 mg daily,  Toprol 100 mg b.i.d.   On exam, a pleasant gentleman in no acute distress.  The weight is 233,  fourteen pounds less than in December 2006.  Blood pressure 120/85,  heart rate 70 and regular, respirations 16.  NECK:  No jugular venous  distention; normal carotid upstrokes without bruits.   LUNGS:  Clear.  CARDIAC:  Split 1st heart sounds; normal 2nd heart sounds; minimal  systolic murmur at the left sternal border.  ABDOMEN:  Soft and  nontender; no masses; no organomegaly.  EXTREMITIES:  No edema.  THORAX:  Tenderness over the right lateral aspect of the 10th rib.   EKG:  Normal sinus rhythm; incomplete right bundle branch block;  otherwise normal.   IMPRESSION:  [name] has atypical chest discomfort.  Prior cardiac  testing included catheterization in January 2006 that revealed normal  coronaries, but impaired left ventricular systolic function.  An  echocardiogram in November of that year was normal.  It may be that left  ventricular dysfunction was transient and related to his  supraventricular tachycardia.  He has never had a stress test.  We will  proceed with stress testing, although his symptoms are not exertional.  He will be treated empirically with sublingual nitroglycerin.  A D-dimer  will be obtained in the hopes that this is normal, since his symptoms  are not suggestive of pulmonary embolism.  Due to recurrent  palpitations, he will carry an event recorder for the next 2 weeks.  I  will see him again after that study has been completed.    Sincerely,      Gerrit Friends. Dietrich Pates, MD, [facility]  Electronically Signed    RMR/MedQ  DD: 01/23/2006  DT: 01/23/2006  Job #: [id number]

## 2010-06-08 NOTE — Procedures (Signed)
[name], [name]                ACCOUNT NO.:  [id number]   MEDICAL RECORD NO.:  [id number]          PATIENT TYPE:  OUT   LOCATION:  RAD                           FACILITY:  [facility]   PHYSICIAN:  [place] Bing, M.D. [facility] OF BIRTH:  04-Jan-1971   DATE OF PROCEDURE:  DATE OF DISCHARGE:                                  ECHOCARDIOGRAM   REFERRING:  Dr. Janna Arch and Dr. Dorethea Clan.   CLINICAL DATA:  A 40 year old gentleman with hypertension, chest pain and  dyspnea.   M-MODE:  Aorta 3.5, left atrium 3.7, septum 1.1, posterior wall 1.0, LV  diastole 5.1, LV systole 3.6.   1.  Technically adequate echocardiographic study.  2.  Normal left and right atrial size; right ventricle is mildly enlarged      with normal wall thickness and normal systolic function.  3.  Normal aortic, mitral, tricuspid and pulmonic valves. Normal proximal      pulmonary artery.  4.  Left ventricular size at the upper limit of normal; no LVH. Normal      regional and global LV systolic function.  5.  Normal IVC.      Lusby Bing, M.D. [facility]  Electronically Signed     RR/MEDQ  D:  12/06/2004  T:  12/06/2004  Job:  [id number]

## 2010-06-08 NOTE — Letter (Signed)
May 09, 2006    Melvyn Novas, MD  [address] [place] [address]   RE:  KIMBALL, APPLEBY  MRN:  [id number]  /  DOB:  [birth year]   Dear Gerlene Burdock:   Mr. Mells returns to the office for continued assessment and treatment  of PSVT, hypertension, and hyperlipidemia.  Since the last visit, he was  seen by Dr. Delford Field, one of our pulmonologist. With some adjustment in  his CPAP mask, he is attempting to use it at night for sleep apnea.  He  has had rare episodes of palpitations that have not been troublesome.  He is more concerned about chest pain.  He experiences localized sharp,  left chest pain of moderate severity that occurs unpredictably.  This  may awaken him from sleep, occur at rest, or less frequently occur while  he is performing physical work.  There is no associated dyspnea.  There  is no radiation.  Symptoms resolve spontaneously within a minute or two.   CURRENT MEDICATIONS:  Include:  1. Lamictal 200 mg daily.  2. Nexium 40 mg daily.  3. Toprol 100 mg b.i.d.  4. Niaspan 1500 mg daily.  He is missing perhaps 20% of his Niaspan      dose due to flushing.   EXAMINATION:  A pleasant, well-dressed appearing young gentleman.  The weight is 231, 3 pounds less than in January.  Blood pressure  130/85, heart rate 70 and regular.  Respirations 16.  NECK:  No jugular venous distention; normal carotid upstrokes without  bruits.  CARDIAC:  Normal first and second heart sounds; minimal systolic murmur  present.  ABDOMEN:  Soft, nontender; no organomegaly.  EXTREMITIES:  No edema; normal distal pulses.   EKG:  Normal sinus rhythm; left atrial abnormality; right ventricular  conduction delay; otherwise normal.  No change when compared with the  previous tracings of January 17, 2005.   Lipid profile in March was quite suboptimal with total cholesterol of  225, triglycerides of 770, HDL of 27 and LDL incalculable.  Non-HDL  cholesterol is nearly 200.   IMPRESSION:  1. Mr. Medearis continues to do well from a symptomatic standpoint.  His      chest discomfort is atypical and almost certainly not of cardiac      origin.  We discussed other possibilities, possible evaluation and      treatment.  He is satisfied that the symptom is minor and will      continue to tolerate it.  He may try a 1 week course of Naprosyn to      see if this is inflammation related and can be treated in that      fashion.  2. Lipids remain a major problem.  In the absence of the possibility      of using statins, we will probably not achieve good control.      Pharmaceutical grade fish oil (lovasa) will be added in a dose of 1      capsule t.i.d. with meals.  A lipid profile will be repeated in 1      month.  I will see this nice gentleman again in 2 months.    Sincerely,      Gerrit Friends. Dietrich Pates, MD, [facility]  Electronically Signed    RMR/MedQ  DD: 05/09/2006  DT: 05/09/2006  Job #: [id number]

## 2010-06-08 NOTE — Procedures (Signed)
[name], [name]                ACCOUNT NO.:  [id number]   MEDICAL RECORD NO.:  [id number]          PATIENT TYPE:  OUT   LOCATION:  SLEEP LAB                     FACILITY:  [facility]   PHYSICIAN:  Marcelyn Bruins, M.D. [facility] DATE OF BIRTH:  Mar 04, 1970   DATE OF STUDY:  04/21/2004                              NOCTURNAL POLYSOMNOGRAM   DATE OF STUDY:  April 21, 2004   REFERRING PHYSICIAN:  Dr. Oval Linsey   INDICATION FOR THE STUDY:  Hypersomnia with sleep apnea. Epworth Score: 14.   SLEEP ARCHITECTURE:  The patient had a total sleep time of 319 minutes with  decreased REM but adequate slow wave sleep. Sleep onset latency was normal  as was REM onset.   IMPRESSION:  1.  Moderate obstructive sleep apnea/hypopnea syndrome with a respiratory      disturbance index of 22 events per hour and O2 desaturation as low as      84%. Events were not positional, but more prominent during REM.      Treatment options for this degree of obstructive sleep apnea include      weight loss, oral appliances, upper airway surgery, or possibly      continuous positive airway pressure depending upon the clinical      situation.  2.  Mild to moderate snoring noted throughout the study.  3.  No clinically significant cardiac arrhythmias.  4.  Mild to moderate numbers of leg jerks with very little sleep disruption.      KC/MEDQ  D:  05/10/2004 13:25:39  T:  05/10/2004 19:20:01  Job:  [id number]

## 2010-06-08 NOTE — H&P (Signed)
[name], PAVER NO.:  [id number]   MEDICAL RECORD NO.:  [id number]          PATIENT TYPE:  INP   LOCATION:  4702                         FACILITY:  [facility]   PHYSICIAN:  Rollene Rotunda, M.D.   DATE OF BIRTH:  November 04, 1970   DATE OF ADMISSION:  DATE OF DISCHARGE:                                HISTORY & PHYSICAL   REASON FOR ADMISSION:  [name] is a 40 year old male with no prior cardiac  history, but with multiple cardiac risk factors notable for longstanding  tobacco smoking, history of hypercholesterolemia, history of hypertension  and family history of premature coronary artery disease.  Initially the  patient presented to [facility] with severe, new onset chest pain  and subsequently found to have supraventricular tachycardia on telemetry.  He is now transferred for further evaluation and management.   The patient reports development of severe (8/10) mid-sternal chest pain  described as both sharp and squeezing, which developed at approximately 9:30  this morning, while getting out of his car. He had associated dyspnea,  diaphoresis, nausea but no vomiting. He also noted radiation to the left  upper extremity down to the elbow.  He drove to the emergency room where  admission electrocardiogram revealed normal sinus rhythm with no acute  changes. However, while on telemetry, he was noted to have supraventricular  tachycardia at approximately 170 BPM. This spontaneously converted to normal  sinus rhythm.  The patient did not receive any treatment for the  tachyarrhythmia and also did not receive any nitroglycerin for the chest  pain.   He has had intermittent chest discomfort since then.  Of note, however, the  patient did take 2 full dose aspirin prior to coming to the emergency room.   He presents with no remote or recent history of exertional chest discomfort.  He does have history of gastroesophageal reflux disease, with remote peptic  ulcer, but states that this morning's episode was not similar to his reflux  symptoms.   ALLERGIES:  ZOCOR (THROAT SWELLING).   MEDICATIONS PRIOR TO ADMISSION:  Nexium, Crestor 20 daily.   PAST MEDICAL HISTORY:  Hypertension (x2 years).  History of  hypercholesterolemia and remote peptic ulcer disease.  Patient reports being  evaluated for palpitations approximately 1 year ago ([facility]).  Status post  right foot surgery approximately 7 years ago.   SOCIAL HISTORY:  Patient lives in [place] with his wife. They have a  total of 5 daughters between them.  He works in Engineer, site with heavy  equipment.  He smokes approximately 2 packs a day and has been doing so  since the age of 89. Denies alcohol use.   FAMILY HISTORY:  Mother age 83, no coronary disease. Father age 7, status  post myocardial infarction at age 36.  Patient has 2 brothers, 1 sister none  of whom have coronary disease.   REVIEW OF SYSTEMS:  Negative for chest pain, orthopnea, PND, pedal edema or  recent upper or lower GI bleed.  Has mild chronic exertional dyspnea.  Occasional heartburn symptoms. Denies history of thyroid disease. The  remainder  of the review of systems is negative.   PHYSICAL EXAMINATION:  VITAL SIGNS:  Blood pressure 124/80, pulse 79 and  regular, respirations 20, temperature 98.6, SAO2 98% (2 L).  GENERAL:  A 40 year old male in no acute distress.  HEENT:  Normocephalic, atraumatic.  NECK:  Without carotid bruits or pulses.  LUNGS:  Clear to auscultation in all fields.  CARDIOVASCULAR:  Regular rate and rhythm  (S1/S2).  No murmurs.  Negative  sternal tenderness on palpation.  ABDOMEN:  Soft, nontender. Intact bowel sounds without bruits.  EXTREMITIES:  Preserved femoral pulses bilaterally. Distal pulses without  edema.  NEUROLOGIC:  No focal deficits.   LABORATORY DATA:  Admission chest x-ray pending.  EKG: Normal sinus rhythm  at 93 BPM with normal axis; nonspecific changes.  Cardiac enzymes  (POC):  MB  less than 1.0, 1.0; troponin I less than 0.05 (2).  Hemoglobin 17.8,  hematocrit 50.4, WBC 7.6, platelets 298. Sodium 138, potassium 3.5, BUN 13,  creatinine 1.1, glucose 102, BNP less than 30.   IMPRESSION:  1.  Chest pain (new onset).      1.  Worrisome for unstable angina pectoris.  2.  Supraventricular tachycardia.      1.  Spontaneous conversion to normal sinus rhythm.  3.  Multiple cardiac risk factors.      1.  Longstanding tobacco.      2.  History of hypercholesterolemia.      3.  History of hypertension.      4.  Family history of premature coronary artery disease.  4.  Gastroesophageal reflux disease.      1.  Remote peptic ulcer disease.  5.  ZOCOR ALLERGY.  6.  Mild hypokalemia.   PLAN:  The patient was admitted to telemetry for rule out myocardial  infarction, further diagnostic testing.  The patient's symptoms are  worrisome for unstable angina pectoris and recommendation is to proceed with  cardiac catheterization on Monday.  The patient is agreeable with this plan.  The risks/benefits have been discussed.   The patient will be treated with aspirin, Lopressor 25 b.i.d. and placed on  intravenous nitroglycerin and heparin.  Blood work will consist of a repeat  BMET in the morning, with magnesium level added, for monitoring of  hypokalemia.  Will check a fasting lipid profile and a TSH level as well.  Patient will also be referred for smoking cessation consultation.       GS/MEDQ  D:  02/04/2004  T:  02/04/2004  Job:  [id number]   cc:   Rhae Lerner. Margretta Ditty, M.D.  [address]  [place]  [place] [address]   Delbert Harness, MD

## 2010-06-08 NOTE — Discharge Summary (Signed)
NAMECAMDEN, [name]                ACCOUNT NO.:  [id number]   MEDICAL RECORD NO.:  [id number]          PATIENT TYPE:  INP   LOCATION:  2040                         FACILITY:  [facility]   PHYSICIAN:  Cecil Cranker, M.D.DATE OF BIRTH:  [birth year]   DATE OF ADMISSION:  03/20/2004  DATE OF DISCHARGE:  03/21/2004                                 DISCHARGE SUMMARY   DISCHARGE DIAGNOSES:  1.  Admitted with symptomatic tachycardia. Symptoms included dizziness,      nausea, blurred vision, chest tightness with radiation to the left upper      extremity. However, no syncope.  2.  History of palpitations with increasing frequency, increasing      symptomatology. They are recurrent despite Toprol 100 mg daily and then      subsequent recurrence, despite subsequent addition of Lanoxin 0.25 mg      daily.  3.  He was ruled out for myocardial infarction on the basis of troponin I      studies.  4.  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  5.  Obstructive sleep apnea with appointment April 21, 2004 with Dr. Delbert Harness.  6.  Scheduled for ablation of the supraventricular tachycardia in March.  7.  Hypertension.  8.  Dyslipidemia.  9.  Family history of coronary artery disease.  10. Ongoing tobacco habituation.  11. Class III-III congestive heart failure.   PROCEDURES THIS ADMISSION:  A 2-D echocardiogram March 1. Study showed that  the ejection fraction was at the lower limits of normal, EF ranged between  50-55%, no left ventricular regional wall motion abnormalities, left  ventricular wall thickness normal, no evidence of aortic stenosis, trivial  mitral regurgitation, left atrial size normal.   DISCHARGE DISPOSITION:  The patient is ready for discharge March 21, 2004,  achieving 95% oxygen saturation on room air.  Temperature 97.3, blood  pressure 112/64, pulse 60,  respirations 20.  The patient was not having any  recurrence of his tachy-arrhythmias while at [facility].  He was  seen by  Dr. Graciela Husbands on March 1st and relates that the beta-blocker Toprol has  caused fatigue and decreased activities of daily living.  His medications  will be changed.   Once again, enzymes February 28:  CK 195, CK-MB 1.2, troponin I study less  than 0.01, BNP less than 30. Cardiac markers on admission:  Myoglobin 61.7,  CK-MB less than 1.0, troponin I less than 0.05.  The patient was discharged  on new medications.   DISCHARGE MEDICATIONS:  He is to hold his Toprol  XL which is 100 mg daily.  1.  Start Coreg 25 mg 1/2 tab twice daily.  2.  Metoprolol 25 mg twice daily.  3.  Continue Nexium 40 mg daily.  4.  Crestor 20 mg daily at bedtime.  5.  Lasix 20 mg daily.  6.  Potassium supplement.  7.  Continue Digoxin 0.25 mg daily.  He will pick-up an event recorder at the [facility] office of [facility]  [facility]. Their office will call to tell him when he can come in for it.  He is to see Dr. Graciela Husbands Wednesday, April 11, 2004 at 10:30 a.m.  He is to  keep his appointment for a sleep study scheduled for April 1. He was also  given a notice that he should not be exposed to second-hand cigarettes  smoke.   BRIEF HISTORY:  [name] is a 40 year old male who experienced dizziness,  increased heart rate, blurred vision and nausea after lunch at about 12:30  on February 28.  He also had left anterior chest pressure. As the  palpitations continued, he had radiation to the left arm. Pain was 7/10.  He  had tingling, her felt clammy, short of breath. He laid down, blood pressure  was 140/102.  He received an aspirin, sublingual nitroglycerin. Emergency  medical service were called. During transport, all symptoms resolved except  the dizziness.  The patient has a history of palpitations which were  apparently getting worse and lasting longer.  He was scheduled for SVT   ablation sometime in March.  He also has the finding of nonischemic  cardiomyopathy with ejection fraction of 38% on echocardiogram.   The patient was admitted, he had cardiac enzymes, 2-D echo taken, he was  seen in the morning by  Dr. Sherryl Manges.  He was started on Loratadine 10  mg daily for sinusitis.   HOSPITAL COURSE:  The patient was admitted with palpitations, shortness of  breath, dizziness, nausea, anterior chest pain with radiation to the left  arm which are the symptoms he has had recurrently, despite being placed on  Toprol  XL 100 mg daily and despite the addition of Lanoxin 0.25 mg.  He was  ruled out myocardial infarction on the basis of serial cardiac enzymes.  The  patient's chest pain and most symptoms except for dizziness had resolved  during the trip to the emergency room, by way of emergency medical services.  The patient has had no recurrence. Telemetry shows only sinus rhythm today.  He has had no recurrence of palpitations. He goes home with medications as  dictated above and follow-up with Dr. Graciela Husbands, Wednesday, April 11, 2004 in 3  weeks, and in 4-5 weeks, he will present for ablation of SVT during  electrophysiology study. He also will have an event monitor provided by the  [facility] office of [facility].      GM/MEDQ  D:  03/21/2004  T:  03/21/2004  Job:  [id number]   cc:   Vida Roller, M.D.  [place] office   Delbert Harness, MD   Duke Salvia, M.D.

## 2010-06-08 NOTE — Procedures (Signed)
[facility]                              EXERCISE TREADMILL   NAME:[name], [name]                       MRN:          [id number]  DATE:01/28/2006                            DOB:          [birth year]    REFERRING PHYSICIAN:  Oval Linsey, MD   PROCEDURE:  Graded exercise test.   CLINICAL DATA:  A 40 year old gentleman with a history of PSVT; recent  evaluation in the emergency department for chest pain and dyspnea.   1. Treadmill exercise performed to a workload of 10 mets and a heart      rate of 133, 71% of the patient's age - predicted maximum. Heart      rate response was likely impaired due to treatment with Toprol. The      patient had held this medicine for the 24 hours preceding the      study.  2. Blood pressure increased from a resting value of 120/70 to 160/70      early in recovery, a normal response.  3. No arrhythmias noted.  4. EKG:  Normal sinus rhythm; nondiagnostic inferior Q waves;      otherwise normal.  5. Stress EKG:  No significant change.   IMPRESSION:  Negative submaximal graded exercise test. Somewhat impaired  exercise capacity for age.     Gerrit Friends. Dietrich Pates, MD, [facility]  Electronically Signed    RMR/MedQ  DD: 01/28/2006  DT: 01/28/2006  Job #: [id number]

## 2010-06-08 NOTE — Discharge Summary (Signed)
[name], [name]                ACCOUNT NO.:  [id number]   MEDICAL RECORD NO.:  [id number]          PATIENT TYPE:  INP   LOCATION:  4702                         FACILITY:  [facility]   PHYSICIAN:  Theodore Demark, P.A. LHCDATE OF BIRTH:  [birth year]   DATE OF ADMISSION:  02/04/2004  DATE OF DISCHARGE:  02/06/2004                                 DISCHARGE SUMMARY   PROCEDURE PERFORMED:  1.  Cardiac catheterization.  2.  Coronary arteriogram.  3.  Left ventriculogram.   HOSPITAL COURSE:  [name] is a 40 year old male with no known history of  coronary artery disease.  He has longstanding history of tobacco use as well  as a history of hyperlipidemia.  He also has a history of hypertension for  two years and a family history of premature coronary artery disease.  He  went to [facility] emergency room for 8/10 chest pain.  His initial  EKG was normal sinus rhythm with nonspecific ST changes; however, he had SVT  up to 170s on the monitor which spontaneously converted to sinus rhythm.  He  was transferred to [facility] for further evaluation and treatment.   [name] had a beta blocker added to his medication regimen and had no  further episodes of SVT.  It was felt that he needed a cardiac  catheterization with multiple cardiac risk factors and this was performed on  February 06, 2004.  The cardiac catheterization showed normal coronary  arteries, an ejection fraction of 38% with diffuse hypokinesis but no AS or  MR.  Dr. Samule Ohm evaluated the films and felt that he had a nonischemic  cardiomyopathy likely secondary to tachycardia.  He is to be discharged home  on a beta blocker and is to follow up with [name].   Post cath [name] is having no chest pain or shortness of breath.  He is  to continue on the Crestor and Nexium he was taking prior to admission as  well as Lamictal.  He had Toprol XL 100 mg a day added to his medication  regimen.  [name] was  considered stable for discharge on February 06, 2004  with outpatient follow-up arranged.   DISCHARGE INSTRUCTIONS:  1.  His activity level is to include no strenuous activity for 48 hours.  2.  He is to stick to a low fat diet.  3.  He is to call our office for problems with the cath site.  He is to      follow up with [name] on January 25. He is to follow up with the      family care physician as needed.   DISCHARGE MEDICATIONS:  1.  Nexium 40 mg daily.  2.  Crestor 20 mg daily  3.  Toprol XL 100 mg daily.       RB/MEDQ  D:  02/06/2004  T:  02/06/2004  Job:  [id number]   cc:   Delbert Harness, MD   Duke Salvia, M.D.

## 2010-06-08 NOTE — H&P (Signed)
NAMESTIVEN, KASPAR NO.:  [id number]   MEDICAL RECORD NO.:  [id number]          PATIENT TYPE:  EMS   LOCATION:  MAJO                         FACILITY:  [facility]   PHYSICIAN:  Duke Salvia, M.D.  DATE OF BIRTH:  Jun 16, 1970   DATE OF ADMISSION:  03/20/2004  DATE OF DISCHARGE:                                HISTORY & PHYSICAL   SUMMARY OF HISTORY:  Mr. Ackert is a 40 year old white male who is  transferred via EMS secondary to chest discomfort to [facility] emergency  room. Mr. Coward stated that approximately 12:30, post lunch, he noticed some  increased heart rate immediately followed by dizziness, slightly blurred  vision, and nausea. Approximately 15 minutes after onset he developed left  anterior chest pressure radiating into his left arm tingling, a clammy  sensation, and shortness of breath. He gave these feelings a 7 on a scale of  0-10.  His co-worker took him to the fire department, which was down the  street where he laid down. His blood pressure is 140/102. He received  aspirin, sublingual nitroglycerin, and oxygen at the fire department and EMS  was called. Upon EMS arrival, blood pressure was 138/102, pulse 92,  respirations 18.  The patient stated that all symptoms resolved except for  the dizziness during transport. He states that since his discharge from the  hospital, he continues to have palpitations. He is unable to count his heart  rate and he feels that they are getting worse in frequency. He cannot relate  the actual frequency of occurrences nor can he relate the duration, but they  can occur at any time. With the palpitations, some times he does have the  previously mentioned symptoms, however he continues to have problems  dizziness unassociated with a heart rate. His dizziness does not change with  position nor does he have spinning resembling vertigo with the dizziness.   PAST MEDICAL HISTORY:  His drug allergies include throat swelling  with  ZOCOR. Medications prior to admission include Nexium 40 mg daily, Crestor 20  mg daily, Toprol-XL 100 mg daily, Lasix 20 mg daily, K-Dur unknown dosage  daily, digoxin 0.25 mg daily, aspirin 325 mg two tablets daily.  Hypertension for the last two years, hyperlipidemia, GERD, and SVT. He  underwent a catheterization on February 06, 2004, because of the associated  symptoms with SVT. His EF was 38% with diffuse hypokinesis. He did not have  any chest discomfort. His only surgery includes right foot surgery. He  resides in [place] with his wife. They have five children between them.  He operates heavy equipment. He continues to smoke approximately two packs  per day and has been doing so for the last 16 years. He denies any alcohol,  drugs, herbal medication. He has not been following a low-salt diet nor has  he been exercising. His mother is alive at age 64 and is in good health. His  father, age 73, has had a heart attack at the age of 47. He has two brothers  and one sister.   REVIEW OF SYSTEMS:  Notable for above as well as he has not had his eyes  checked in approximately ten years. He has had chronic sinus congestion,  treated for throat infection by his primary care MD over the last three  months, and frequent headaches. He also has nocturia since starting on  Lasix.   PHYSICAL EXAMINATION:  GENERAL: A well-developed, well-nourished, pleasant,  white male in no apparent distress. However, he and his wife seem somewhat  angry.  VITAL SIGNS: Temperature 98.2, blood pressure 122/71, pulse 78, respirations  20, saturation 0.8% on two liters.  HEENT: Unremarkable. Sclerae are injected.  NECK: Supple without thyromegaly, adenopathy, or bruits. He has 5-6 cm JVD.  HEART: Regular rate and rhythm without murmurs, rubs, clicks, or gallops.  LUNGS: Clear to auscultation bilaterally.  SKIN/INTEGUMENT: Intact without rashes or lesions.  ABDOMEN: Bowel sounds are present without  organomegaly, masses, or  tenderness.  EXTREMITIES: Negative clubbing, cyanosis, or edema. All pulses are  symmetrical and intact bilaterally.  NEUROLOGIC: Intact.   EKG shows normal sinus rhythm, normal axis, normal intervals, rate 79, and  early R wave.   IMPRESSION:  1.  Probable recurring supraventricular tachycardia, however there is no      documentation from the fire department or EMS, associated with recurrent      chest discomfort and dizziness.  2.  Probable sinus congestion, which may also be contributing to his      dizziness.  3.  Tobacco use.  4.  Hypertension.   DISPOSITION:  Dr. Gala Romney has reviewed the patient's history and examined  the patient. Dr. Gala Romney does not feel all his symptoms are cardiac  related; however, he is quite symptomatic in the process of a workup with  Dr. Graciela Husbands for radio frequency ablation. We will admit him for observation on  telemetry, check an echocardiogram to re-evaluate his ejection fraction. He  does not have any evidence of heart failure on exam. Dr. Graciela Husbands will see him  in the morning and we will continue his home medications and treat him for  sinus congestion.      EW/MEDQ  D:  03/20/2004  T:  03/20/2004  Job:  [id number]
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
## 2010-06-08 NOTE — H&P (Signed)
NAMEMIKO, SIRICO                ACCOUNT NO.:  [id number]   MEDICAL RECORD NO.:  [id number]           PATIENT TYPE:   LOCATION:                               FACILITY:  [facility]   PHYSICIAN:  Maple Mirza, P.A. DATE OF BIRTH:  1970-07-21   DATE OF ADMISSION:  DATE OF DISCHARGE:                                HISTORY & PHYSICAL   ADDENDUM:  This is a follow up on an echocardiogram which was done April 3  to rule out pericardial effusion.  There is no pericardial effusion.  The  patient had been complaining of chest pain after a radiofrequency catheter  ablation and there was some concern that there might have been penetration  of the myocardial wall.  No pericardial effusion.  Of interest, the  preliminary study shows that the ejection fraction does not seem to be  changed much from an echocardiogram which was done on March 1.  At that  time, the official story reads ejection fraction 50-55%.  There is a prior  left heart catheterization January 27, 2004, by Dr. Samule Ohm in the setting of  tachycardia which showed ejection fraction of 38%.  Imaging the  ventriculogram shows, however, fairly complete contraction of the myocardial  walls.  It will be of interest to see if the official reading on the  echocardiogram April 3 also shows an ejection  fraction in the range of 50-  55%.  The patient will be presenting Wednesday, April 12, for medication  update.      GM/MEDQ  D:  04/24/2004  T:  04/24/2004  Job:  [id number]   cc:   Vida Roller, M.D.  Fax: [phone]

## 2010-06-08 NOTE — Letter (Signed)
February 19, 2006    Melvyn Novas, MD  [address]  [place], [place] [address]   RE:  SIMRAN, MANNIS  MRN:  [id number]  /  DOB:  [birth year]   Dear Gerlene Burdock:   Mr. Viverette returns to the office for continued assessment and treatment  of palpitations, chest discomfort, and hyperlipidemia.  Since his last  visit, he has done fairly well.  A number of rhythm strips were  transmitted for chest discomfort.  The rhythm was either normal sinus or  sinus tachycardia to a rate of 140 beats per minute.  No arrhythmias  were apparent other than sinus tachycardia.   Mr. Flythe had a recent lipid profile.  The results were decidedly  suboptimal with total cholesterol of 252, triglycerides of 970, HDL of  29, and an incalculable LDL.   His exam remains benign.  Blood pressure is 100/80.  Heart rate 84 and  regular.  Weight 234 - stable.   A stress test was performed.  The patient achieved a decent workload and  heart rate with no chest discomfort and no EKG abnormalities.   IMPRESSION:  There is no evidence that Mr. Kowal is suffering myocardial  ischemia.  He does generate a fairly impressive sinus tachycardia at  rest.  We will attempt to treat him symptomatically by increasing Toprol  to 200 mg b.i.d.   His lipid disorder represents a more difficult long-term problem.  He  has experienced potentially life-threatening accelerated allergic  reactions to simvastatin and atorvastatin.  Surprisingly, he had similar  flushing and throat swelling with fenofibrate.  We will try to titrate  Niaspan up to a  dose of 1500 mg daily and repeat his lipid profile.  He will probably  require Omacor as well and then LDL lowering with a resin.  He continues  not to tolerate his positive pressure device for sleep apnea.  I will  refer him back to his pulmonologist for reassessment.  I will plan to  see this nice gentleman again in 5 weeks.    Sincerely,      Gerrit Friends. Dietrich Pates, MD, [facility]  Electronically Signed    RMR/MedQ  DD: 02/19/2006  DT: 02/19/2006  Job #: [id number]

## 2010-06-08 NOTE — Procedures (Signed)
NAMELORAN, FLEET                ACCOUNT NO.:  [id number]   MEDICAL RECORD NO.:  [id number]          PATIENT TYPE:  OUT   LOCATION:  RAD                           FACILITY:  [facility]   PHYSICIAN:  Vida Roller, M.D.   DATE OF BIRTH:  15-Feb-1970   DATE OF PROCEDURE:  01/22/2005  DATE OF DISCHARGE:                                  ECHOCARDIOGRAM   TAPE NUMBER:  [id number].  Tape count [id number].   INDICATIONS:  Evaluation for LV systolic function.  The technical quality of  the study is adequate.   M-MODE TRACINGS:  Aorta is 36 mm.   Left atrium is 43 mm.   Septum is 12 mm.   Posterior wall is 12 mm.   Left ventricular diastolic dimension 48 mm.   Left ventricular systolic dimension 38 mm.   Two-dimensional echocardiogram and Doppler imaging:  The left ventricle is  normal size with low normal ejection fraction at 50-55%.  There are no wall  motion abnormalities seen.  There is mild concentric left ventricular  hypertrophy.   The right ventricle appears to be normal size with normal systolic function.   There is mild left atrial enlargement.   There is no obvious valvular heart disease.   There is no pericardial effusion.      Vida Roller, M.D.  Electronically Signed     JH/MEDQ  D:  01/22/2005  T:  01/22/2005  Job:  [id number]

## 2010-06-08 NOTE — Op Note (Signed)
[name], CORBIT                ACCOUNT NO.:  [id number]   MEDICAL RECORD NO.:  [id number]          PATIENT TYPE:  OIB   LOCATION:  2899                         FACILITY:  [facility]   PHYSICIAN:  Duke Salvia, M.D.  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   POSTOPERATIVE DIAGNOSIS:  AV nodal re-entry.   PROCEDURE PERFORMED:  Invasive electrophysiologic study, isoproterenol  infusion, arrhythmia mapping and radiofrequency catheter ablation.   DESCRIPTION OF PROCEDURE:  Following the obtaining of informed consent, the  patient was brought to the electrophysiology laboratory and placed on the  fluoroscopic table in supine position.  After routine prep and drape,  cardiac catheterization was performed with local anesthesia and conscious  sedation.  Noninvasive blood pressure monitoring, transcutaneous oxygen  saturation monitoring and end tidal CO2 monitoring were performed  continuously throughout the procedure.  Following the procedure, the  catheters were removed.  Hemostasis was obtained and the patient was  transferred to the holding area in stable condition.   CATHETERS:  A 5 French quadripolar catheter was inserted via the left  femoral vein to the AV junction to measure the His electrogram.  A 5 French quadripolar catheter was inserted via the left femoral vein to  the right ventricular apex.  A 6 French octapolar catheter was inserted via the right femoral vein to the  coronary sinus.  A 7 French 4 mm deflectable tip ablation catheter was inserted via an SL4  sheath to mapping sites in the posterior septal space.   Surface leads 1, aVF, and V1 were monitored continuously throughout the  procedure.  Following insertion of the catheter, the stimulation protocol  included incremental  ventricular pacing, incremental atrial pacing, single  atrial extra stimuli at 700, 600, 500 and 400 msec, double atrial extra  stimuli at cycle length of 700, 500 and 400 msec in the presence and the  absence of isoproterenol.   RESULTS:  Surface electrocardiogram.   INITIAL:   FINAL (ON ISOPROTERENOL):   RHYTHM:  Sinus initial                    Sinus final.   R-R interval 1033 msec        676 msec  P-R interval is 165 msec            143 msec  QRS duration is 97 msec       99 msec  Q-T interval is 390 msec            343 msec  P-wave duration is 97 msec    106 msec  Bundle branch block is absent  absent  Pre-excitation is absent            absent   A-H interval is 57                  78 msec  H-V interval is 55 msec  48 msec   AV NODAL FUNCTION:  AV Wenckebach (no isoproterenol) is 500 msec.  VA conduction, VA Wenckebach is 500 msec.  AV nodal effective refractory period pre-ablation and pre-isoproterenol was  600:390.  AV nodal conduction properties pre ablation was discontinuous with echo  beats and nonsustained tachycardia was inducible (see below).  Postablation  with single atrial extra stimuli discontinuity but no echo beats were seen  and with double atrial extra stimuli, discontinuity with single echo beats  were seen.   ACCESSORY PATHWAY FUNCTION:  No evidence of an accessory pathway was  identified.   ARRHYTHMIAS INDUCED:  AV nodal re-entry tachycardia was very difficult to  induce, we got nonsustained tachycardia on three occasions, the longest of  which was six beats.  It was induced with coronary sinus pacing at  400:300:250-270.  Tachycardia cycle length was 345 msec.   Characteristics of the tachycardia included (a) earliest atrial electrogram  was in His electrogram.  (b)  Initiation was AH prolongation dependent.  (c)  During typical episode of tachycardia, the VA interval was 0 msec, HA  interval was 68 msec and the AH interval was 260  msec.   FLUOROSCOPY TIME:  A total of 8 minutes and 10 minutes of fluoroscopy view  was utilized at 7-1/2 frames per second.   RADIO FREQUENCY ENERGY:  80 applications of RF energy were applied.  With  all but one of these, junctional rhythm ensued and the application was  maintained as long as the patient could tolerate it.  Application duration  ranged from 8 to 23 seconds.  Despite being quite sedated, the patient had  significant amount of discomfort associated with radio frequency application  resulting in deep breaths, Valsalva and secondary movement of the catheter.   Because of this, it was elected not to apply any more radio frequency energy  after nothing but a single echo beat could be induced in the presence of or  in the absence of isoproterenol although this was less than an ideal end  point.   IMPRESSION:  1.  Normal sinus function.  2.  Normal atrial function.  3.  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  Slow pathway modification was undertaken      and appears to be successful; however, RF application duration was      limited because of patient pain even while he was quite sedated and deep      breathing resulted in movement of the catheter.  4.  Normal His Purkinje system function.  5.  No accessory pathway.  6.  Normal ventricular response to programmed stimulation.   SUMMARY:  In conclusion, the results of the electrophysiologic testing  identified AV nodal re-entry tachycardia as the patient's clinical  tachycardia mechanism.  Slow pathway modification was undertaken but RF  energy was limited because of pain as noted above.  However, the patient was  no longer inducible for more than isolated echo beat despite the presence of  isoproterenol at 3 mcg/minute.  Further good junctional rhythm was seen on most of the applications and on two of them in which the duration of energy  application was  greater than 15 seconds.  At this point the procedure was  discontinued because of discomfort and not really wanting to push, given the  lack of clear end points.   The patient will be observed and then discharged.  SCK/MEDQ  D:  04/24/2004  T:  04/24/2004  Job:  [id number]   cc:   Vida Roller, M.D.  Fax: [phone]   Delbert Harness, MD   Electrophys laboratory

## 2010-06-08 NOTE — Discharge Summary (Signed)
[name], [name]                ACCOUNT NO.:  [id number]   MEDICAL RECORD NO.:  [id number]          PATIENT TYPE:  OIB   LOCATION:  4731                         FACILITY:  [facility]   PHYSICIAN:  Duke Salvia, M.D.  DATE OF BIRTH:  03/09/1970   DATE OF ADMISSION:  04/24/2004  DATE OF DISCHARGE:  04/24/2004                                 DISCHARGE SUMMARY   DISCHARGE DIAGNOSES:  1.  Incessant tachy palpitations.  2.  AVNRT.  3.  Discharging same day as radiofrequency catheter ablation of A-V nodal      reentry tachycardia with successful slow pathway modification by Dr.      Sherryl Manges.  4.  Nonischemic cardiomyopathy, ejection fraction 38% at catheterization      January, 2006, believed to be tachycardia mediated.  5.  History of left heart catheterization January 27, 2004, with normal      coronary anatomy, ejection fraction 38%.  6.  Post procedure chest pain April 24, 2004, with transient right groin pain      after catheterization.  Electrocardiogram was taken that showed no      significant changes, pain was alleviated with morphine and      nitroglycerin.   PROCEDURE:  April 24, 2004, electrophysiology study with tachyarrhythmia very  difficult to induce, it was an A-V nodal reentry tachycardia, and there was  successful slow pathway modification by Dr. Sherryl Manges.  The patient  tolerated the procedure well, is transferred to telemetry floor.  In the  recovery room he complained of right groin pain which was transient.  Later  upon admission to the telemetry floor he had substernal midpoint chest pain  without radiation, diaphoresis or nausea.  Electrocardiogram was taken and  this showed normal sinus rhythm without significant electrocardiographic  changes.  The patient was treated with morphine sulfate, nitroglycerin,  Xanax and has been sleeping most of the rest of the afternoon.   Patient's secondary diagnoses include:  1.  Paroxysmal nocturnal dyspnea/orthopnea.  2.  Lower extremity edema.  3.  Possible anxiety/panic attacks.  4.  Obstructive sleep apnea status post sleep study.   Patient discharging the same day.  He is afebrile and vital signs are  stable.  He goes home on the following medications for pain:  1.  Percocet 5/325 1-2 tabs every 4-6 hours as needed.  2.  A new medication, enteric coated aspirin 325 mg daily for the next 6      weeks.  3.  Crestor 20 mg daily.  4.  Nexium 40 mg daily.  5.  Mavik 2 mg daily.  6.  Lamictal 200 mg daily.  7.  Coreg 25 mg twice daily.  8.  Lasix 20 mg daily.  9.  Potassium chloride over-the-counter 20 mEq daily.   Patient is not to continue taking digoxin 0.25 mg, this has been cancelled.  He is not to continue taking metoprolol 25 mg twice daily, this has been  cancelled.  The patient is asked to avoid driving for the next 2 days.  He  is to avoid straining or heavy  lifting for the next 2 weeks.  He is able to  shower.  He is to call [phone] if he experiences pain or swelling at the  catheterization site.  If he plans dental work, even just teeth cleaning, he  is to call [phone] for antibiotic coverage, this applies through September,  2006.  He will have office visit with Dr. Sherryl Manges Monday, May 28, 2004,  at 3:00 in the afternoon, and he will see Dr. Dionicio Stall Wednesday, August 01, 2004, at 2:15.   BRIEF HISTORY:  [name] is a 40 year old male who was seen by Dr. Graciela Husbands in  January, 2006.  At that time he was at [facility] with a diagnosis  of unstable angina in the setting of supraventricular tachycardia.  He  underwent left heart catheterization which showed normal coronary arteries.  He had an ejection fraction of 38%.  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  Dr. Graciela Husbands saw him on February 15, 2004, for a  routine followup and the patient said he has been doing well except for  episodes of tachycardia.  The plan is for catheter based ablation in March   or April.  He does have some paroxysmal nocturnal dyspnea and orthopnea.  He  gets short of breath when he lays flat and takes 3-4 minutes to do so, but  then he has to sit up to regain his normal breathing capacity.  He sits up  to sleep.  He notes no lower extremity edema.  The palpitations have been  relatively constant.  With them, he has some fullness in his chest but no  overt chest discomfort.  The patient presented in January with chest pain  and he continues to have chest pain, mostly occur in the morning, lasts 10-  15 minutes, they are associated with lightheadedness and a dry mouth, they  are not exertionally related.  He wore an event recorder but this  interrogation demonstrates no arrhythmias.  The patient has a  supraventricular tachycardia.  He would like to undergo ablation.  The role  of beta-blockade as an alternative was discussed and the patient wishes to  have definitive therapy.  The risks and benefits are described to the  patient and he wishes to undergo radiofrequency catheter ablation and this  will be set up for April 24, 2004.  Our plan for medications are to  discontinue metoprolol and to increase Coreg to 25 mg twice a day and with  eventual up titration of Coreg to 50 mg twice a day.  Following ablation,  Lanoxin will be discontinued.  He also talked with his cardiologist about  Crestor in the setting of nonischemic cardiomyopathy.   HOSPITAL COURSE:  The patient presented electively April 24, 2004.  He  underwent radiofrequency catheter ablation for supraventricular tachycardia  and A-V nodal reentrant tachycardia and this was fairly hard to induce, the  patient has tolerated the procedure well, and with some mild chest  discomfort and mild right groin discomfort was able to be discharged the  same day.  Medications and followup have been dictated.      GM/MEDQ  D:  04/24/2004  T:  04/24/2004  Job:  [id number]   cc:   Delbert Harness, MD   Vida Roller,  M.D. Fax: [phone]   Duke Salvia, M.D.

## 2010-06-08 NOTE — Procedures (Signed)
[name], [name]                ACCOUNT NO.:  [id number]   MEDICAL RECORD NO.:  [id number]          PATIENT TYPE:  OUT   LOCATION:  SLEEP LAB                     FACILITY:  [facility]   PHYSICIAN:  Marcelyn Bruins, M.D. [facility] DATE OF BIRTH:  06/03/1970   DATE OF STUDY:  03/01/2005                              NOCTURNAL POLYSOMNOGRAM   REFERRING PHYSICIAN:  Dr. Erle Crocker   DATE OF STUDY:  March 01, 2005   INDICATION FOR STUDY:  Hypersomnia with sleep apnea.   EPWORTH SCORE:  17   SLEEP ARCHITECTURE:  The patient had a total sleep time of 209 minutes with  decreased slow wave sleep and REM. Sleep onset latency was normal as was REM  onset. Sleep efficiency was very decreased at 62%.   RESPIRATORY DATA:  The patient underwent a split night study where he was  found to have 64 obstructive events in the first 130 minutes of sleep. This  gave the patient a Respiratory Disturbance Index of 30 events per hour  extrapolated over the entire night. The patient had loud snoring and the  events noted were not positional. By split night protocol the patient was  placed on a CPAP mask and CPAP titration was initiated. The patient had  great difficulties with mask comfort due to nasal soreness and also a sore  throat. It should be noted the patient is on a full face mask at home with a  CPAP setting of 8 cm. It is really unclear whether he is on humidification  at home. Obviously the study was completed with one of the masks however the  patient continued to complain of nasal soreness. He was ultimately titrated  to a final pressure of 8 cm which appeared to give excellent control of both  his obstructive events and snoring, even during REM.   OXYGEN DATA:  There was O2 desaturation as low as 87%.   CARDIAC DATA:  No clinically significant cardiac arrhythmias.   MOVEMENT/PARASOMNIA:  The patient was found to have 36 leg jerks with  approximately 2-3 per hour resulting in arousal or  awakening. There was no  abnormal behaviors noted.   IMPRESSION/RECOMMENDATION:  1.  Moderate obstructive sleep apnea/hypopnea syndrome with a Respiratory      Disturbance Index of the first half of the night at 30 per hour with O2      desaturation as low as 87%. The patient was then placed on continuous      positive airway pressure and had excellent control of both obstructive      events and snoring at a final pressure setting of 8 cm. The patient did      have difficulties with mask tolerance, and perhaps adding heated      humidification may help if he does not already have this on his      continuous positive airway pressure machine at home. Desensitization and      improved mask fitting with a durable medical equipment company may also      assist with his tolerance.  2.  Small numbers of leg jerks with mild sleep disruption.  It is unclear      whether this is due to his sleep disordered breathing or whether he has      a secondary sleep disorder such as the restless leg syndrome or periodic      leg movement syndrome. Clinical correlation is suggested after pressure      optimization.                                            ______________________________  Marcelyn Bruins, M.D. [facility]  Diplomate, American Board of Sleep  Medicine     KC/MEDQ  D:  03/11/2005 15:57:48  T:  03/11/2005 20:07:43  Job:  [id number]

## 2010-06-08 NOTE — Procedures (Signed)
NAMECAIDAN, [name]                ACCOUNT NO.:  [id number]   MEDICAL RECORD NO.:  [id number]          PATIENT TYPE:  OUT   LOCATION:  SLEEP LAB                     FACILITY:  [facility]   PHYSICIAN:  Marcelyn Bruins, M.D. [facility] DATE OF BIRTH:  [birth year]   DATE OF STUDY:  04/21/2004                              NOCTURNAL POLYSOMNOGRAM   REFERRING PHYSICIAN:  Dr. Janna Arch.   INDICATION FOR THE STUDY:  Hypersomnia with sleep apnea. Epworth score: 14.   SLEEP ARCHITECTURE:  The patient had a total sleep time of 320 minutes with  significantly decreased slow wave sleep and REM. Sleep onset latency was  normal at 13 minutes and REM onset was fairly normal as well. Sleep  efficiency was 76%.   IMPRESSION:  1.  Moderate obstructive sleep apnea/hypopnea syndrome with a respiratory      disturbance index of 22 events per hour and oxygen desaturation as low      as 84%. Events were not positional nor were they primarily associated      with REM. Treatment options include weight loss, upper airway surgery,      oral appliance, and finally, CPAP.  2.  Mild to moderate snoring noted throughout the study.  3.  No clinically significant cardiac arrhythmias.  4.  Small to moderate numbers of periodic leg movements with very little      sleep disruption.      KC/MEDQ  D:  05/14/2004 15:09:45  T:  05/14/2004 16:49:56  Job:  [id number]

## 2010-07-07 ENCOUNTER — Emergency Department (HOSPITAL_COMMUNITY): Payer: Self-pay

## 2010-07-07 ENCOUNTER — Emergency Department (HOSPITAL_COMMUNITY)
Admission: EM | Admit: 2010-07-07 | Discharge: 2010-07-08 | Disposition: A | Discharge status: Home / Self Care | Payer: Self-pay | Attending: Emergency Medicine | Admitting: Emergency Medicine

## 2010-07-07 DIAGNOSIS — I10 Essential (primary) hypertension: Secondary | ICD-10-CM | POA: Insufficient documentation

## 2010-07-07 DIAGNOSIS — F319 Bipolar disorder, unspecified: Secondary | ICD-10-CM | POA: Insufficient documentation

## 2010-07-07 DIAGNOSIS — I428 Other cardiomyopathies: Secondary | ICD-10-CM | POA: Insufficient documentation

## 2010-07-07 DIAGNOSIS — K219 Gastro-esophageal reflux disease without esophagitis: Secondary | ICD-10-CM | POA: Insufficient documentation

## 2010-07-07 DIAGNOSIS — J069 Acute upper respiratory infection, unspecified: Secondary | ICD-10-CM | POA: Insufficient documentation

## 2010-07-07 DIAGNOSIS — Z79899 Other long term (current) drug therapy: Secondary | ICD-10-CM | POA: Insufficient documentation

## 2010-07-07 LAB — COMPREHENSIVE METABOLIC PANEL
Albumin: 3.8 g/dL (ref 3.5–5.2)
BUN: 10 mg/dL (ref 6–23)
Chloride: 102 mEq/L (ref 96–112)
Creatinine, Ser: 1.16 mg/dL (ref 0.50–1.35)
GFR calc Af Amer: >60 mL/min (ref >60)
GFR calc non Af Amer: >60 mL/min (ref >60)
Total Bilirubin: 0.3 mg/dL (ref 0.3–1.2)

## 2010-07-07 LAB — CBC
MCH: 31.1 pg (ref 26.0–34.0)
Platelets: 226 10*3/uL (ref 150–400)
RBC: 5.01 MIL/uL (ref 4.22–5.81)
RDW: 13.8 % (ref 11.5–15.5)
WBC: 9.3 10*3/uL (ref 4.0–10.5)

## 2010-07-07 LAB — DIFFERENTIAL
Basophils Relative: 0 % (ref 0–1)
Eosinophils Absolute: 0.2 10*3/uL (ref 0.0–0.7)
Eosinophils Relative: 2 % (ref 0–5)
Neutrophils Relative %: 56 % (ref 43–77)

## 2010-07-07 LAB — TROPONIN I: Troponin I: <0.3 ng/mL (ref <0.30)

## 2010-07-07 LAB — D-DIMER, QUANTITATIVE: D-Dimer, Quant: 0.32 ug/mL-FEU (ref 0.00–0.48)

## 2010-07-07 LAB — CK TOTAL AND CKMB (NOT AT ARMC): Relative Index: 0.5 (ref 0.0–2.5)

## 2010-07-14 LAB — CULTURE, BLOOD (ROUTINE X 2): Culture: NO GROWTH

## 2010-08-22 ENCOUNTER — Emergency Department (HOSPITAL_COMMUNITY)
Admission: EM | Admit: 2010-08-22 | Discharge: 2010-08-22 | Disposition: A | Discharge status: Home / Self Care | Payer: Self-pay | Attending: Emergency Medicine | Admitting: Emergency Medicine

## 2010-08-22 ENCOUNTER — Emergency Department (HOSPITAL_COMMUNITY): Payer: Self-pay

## 2010-08-22 DIAGNOSIS — M79609 Pain in unspecified limb: Secondary | ICD-10-CM | POA: Insufficient documentation

## 2010-08-22 DIAGNOSIS — R29898 Other symptoms and signs involving the musculoskeletal system: Secondary | ICD-10-CM | POA: Insufficient documentation

## 2010-08-22 DIAGNOSIS — S8990XA Unspecified injury of unspecified lower leg, initial encounter: Secondary | ICD-10-CM | POA: Insufficient documentation

## 2010-08-22 DIAGNOSIS — Z79899 Other long term (current) drug therapy: Secondary | ICD-10-CM | POA: Insufficient documentation

## 2010-08-22 DIAGNOSIS — F319 Bipolar disorder, unspecified: Secondary | ICD-10-CM | POA: Insufficient documentation

## 2010-08-22 DIAGNOSIS — IMO0002 Reserved for concepts with insufficient information to code with codable children: Secondary | ICD-10-CM | POA: Insufficient documentation

## 2010-08-22 DIAGNOSIS — S8010XA Contusion of unspecified lower leg, initial encounter: Secondary | ICD-10-CM | POA: Insufficient documentation

## 2010-08-22 DIAGNOSIS — K219 Gastro-esophageal reflux disease without esophagitis: Secondary | ICD-10-CM | POA: Insufficient documentation

## 2010-08-22 DIAGNOSIS — M7989 Other specified soft tissue disorders: Secondary | ICD-10-CM | POA: Insufficient documentation

## 2010-08-22 DIAGNOSIS — I10 Essential (primary) hypertension: Secondary | ICD-10-CM | POA: Insufficient documentation

## 2010-08-23 ENCOUNTER — Emergency Department (HOSPITAL_COMMUNITY)
Admission: EM | Admit: 2010-08-23 | Discharge: 2010-08-23 | Disposition: A | Discharge status: Home / Self Care | Payer: Self-pay | Attending: Emergency Medicine | Admitting: Emergency Medicine

## 2010-08-23 ENCOUNTER — Emergency Department (HOSPITAL_COMMUNITY): Payer: Self-pay

## 2010-08-23 DIAGNOSIS — R071 Chest pain on breathing: Secondary | ICD-10-CM | POA: Insufficient documentation

## 2010-08-23 DIAGNOSIS — K219 Gastro-esophageal reflux disease without esophagitis: Secondary | ICD-10-CM | POA: Insufficient documentation

## 2010-08-23 DIAGNOSIS — Z79899 Other long term (current) drug therapy: Secondary | ICD-10-CM | POA: Insufficient documentation

## 2010-08-23 DIAGNOSIS — M79609 Pain in unspecified limb: Secondary | ICD-10-CM | POA: Insufficient documentation

## 2010-08-23 DIAGNOSIS — R110 Nausea: Secondary | ICD-10-CM | POA: Insufficient documentation

## 2010-08-23 DIAGNOSIS — I10 Essential (primary) hypertension: Secondary | ICD-10-CM | POA: Insufficient documentation

## 2010-08-23 DIAGNOSIS — F319 Bipolar disorder, unspecified: Secondary | ICD-10-CM | POA: Insufficient documentation

## 2010-08-23 LAB — CBC
HCT: 44.1 % (ref 39.0–52.0)
MCH: 31.7 pg (ref 26.0–34.0)
MCHC: 35.8 g/dL (ref 30.0–36.0)
MCV: 88.6 fL (ref 78.0–100.0)
RDW: 13.9 % (ref 11.5–15.5)

## 2010-08-23 LAB — BASIC METABOLIC PANEL
BUN: 12 mg/dL (ref 6–23)
Calcium: 9 mg/dL (ref 8.4–10.5)
Creatinine, Ser: 1.11 mg/dL (ref 0.50–1.35)
GFR calc non Af Amer: >60 mL/min (ref >60)
Glucose, Bld: 104 mg/dL — ABNORMAL HIGH (ref 70–99)

## 2010-11-02 LAB — DIFFERENTIAL
Basophils Absolute: 0
Basophils Relative: 0
Eosinophils Absolute: 0.1
Eosinophils Relative: 1
Lymphs Abs: 3.5 — ABNORMAL HIGH
Monocytes Absolute: 0.9 — ABNORMAL HIGH
Neutro Abs: 6.7
Neutrophils Relative %: 60

## 2010-11-02 LAB — POTASSIUM: Potassium: 4.4

## 2010-11-02 LAB — CK TOTAL AND CKMB (NOT AT ARMC)
CK, MB: 1.1
Relative Index: 0.5
Total CK: 225

## 2010-11-02 LAB — TSH: TSH: 1.426

## 2010-11-02 LAB — CBC
MCHC: 34.8
MCV: 88.7
RDW: 13.9

## 2010-11-02 LAB — POCT CARDIAC MARKERS
Operator id: 265261
Troponin i, poc: <0.05

## 2010-11-02 LAB — TROPONIN I: Troponin I: 0.04

## 2010-11-02 LAB — BASIC METABOLIC PANEL
CO2: 23
Calcium: 8.9
Chloride: 104
Creatinine, Ser: 0.99
Glucose, Bld: 109 — ABNORMAL HIGH

## 2010-11-02 LAB — RAPID URINE DRUG SCREEN, HOSP PERFORMED
Benzodiazepines: NOT DETECTED
Cocaine: NOT DETECTED

## 2011-02-20 ENCOUNTER — Encounter: Payer: Self-pay | Admitting: Cardiology

## 2011-02-20 ENCOUNTER — Encounter (HOSPITAL_COMMUNITY): Payer: Self-pay | Admitting: Pharmacy Technician

## 2011-02-20 ENCOUNTER — Telehealth: Payer: Self-pay | Admitting: Internal Medicine

## 2011-02-20 ENCOUNTER — Encounter: Payer: Self-pay | Admitting: *Deleted

## 2011-02-20 ENCOUNTER — Ambulatory Visit (INDEPENDENT_AMBULATORY_CARE_PROVIDER_SITE_OTHER): Payer: Self-pay | Admitting: Cardiology

## 2011-02-20 DIAGNOSIS — I119 Hypertensive heart disease without heart failure: Secondary | ICD-10-CM

## 2011-02-20 DIAGNOSIS — R079 Chest pain, unspecified: Secondary | ICD-10-CM

## 2011-02-20 DIAGNOSIS — E7800 Pure hypercholesterolemia, unspecified: Secondary | ICD-10-CM

## 2011-02-20 DIAGNOSIS — R0989 Other specified symptoms and signs involving the circulatory and respiratory systems: Secondary | ICD-10-CM

## 2011-02-20 DIAGNOSIS — R0600 Dyspnea, unspecified: Secondary | ICD-10-CM

## 2011-02-20 LAB — CBC WITH DIFFERENTIAL/PLATELET
Basophils Relative: 0.4 % (ref 0.0–3.0)
Eosinophils Relative: 2.3 % (ref 0.0–5.0)
HCT: 47.6 % (ref 39.0–52.0)
Hemoglobin: 16.4 g/dL (ref 13.0–17.0)
Lymphs Abs: 2.6 10*3/uL (ref 0.7–4.0)
MCV: 91.6 fl (ref 78.0–100.0)
Monocytes Absolute: 0.8 10*3/uL (ref 0.1–1.0)
Monocytes Relative: 9.3 % (ref 3.0–12.0)
Neutro Abs: 4.8 10*3/uL (ref 1.4–7.7)
Platelets: 305 10*3/uL (ref 150.0–400.0)
RBC: 5.19 Mil/uL (ref 4.22–5.81)
WBC: 8.3 10*3/uL (ref 4.5–10.5)

## 2011-02-20 LAB — BASIC METABOLIC PANEL
Chloride: 109 mEq/L (ref 96–112)
Creatinine, Ser: 1 mg/dL (ref 0.4–1.5)
Sodium: 141 mEq/L (ref 135–145)

## 2011-02-20 LAB — PROTIME-INR: Prothrombin Time: 10.4 s (ref 10.2–12.4)

## 2011-02-20 MED ORDER — LISINOPRIL 5 MG PO TABS: 5.0000 mg | ORAL_TABLET | Freq: Every day | ORAL | Status: DC

## 2011-02-20 MED ORDER — NITROGLYCERIN 0.4 MG SL SUBL: 0.4000 mg | SUBLINGUAL_TABLET | SUBLINGUAL | Status: DC | PRN

## 2011-02-20 NOTE — Telephone Encounter (Signed)
Pt calls with increasing chest pain, shortness of breath and diaphoresis, and blurred vision over the past 2 weeks.   He experienced this "knife-like pain in left chest and numb tingling again this morning while driving from [facility] along wit diaphoresis & visual disturbance". He had to pull off the road until this passed. Pt does not have ntg.  States he did in the past.  He has been seen in [place] office but is on his way here.  Spoke with Dr. Patty Sermons and he will see pt this am.  Pt has not/did not want to go to ED. Pt aware of appt. States he ihas "bubbles of sweat on his forehead but painfree at this time. Mylo Red RN

## 2011-02-20 NOTE — Telephone Encounter (Addendum)
Pt having SOB for about 2 weeks, chest pain started yesterday with blurred vision, has seen dr Graciela Husbands in the past about 2006

## 2011-02-20 NOTE — Patient Instructions (Signed)
Will obtain labs today and call you with the results   Start ASA 325 mg daily, Lisinopril 5 mg daily, and NTG to use as needed for chest pains   Your physician has requested that you have a cardiac catheterization. Cardiac catheterization is used to diagnose and/or treat various heart conditions. Doctors may recommend this procedure for a number of different reasons. The most common reason is to evaluate chest pain. Chest pain can be a symptom of coronary artery disease (CAD), and cardiac catheterization can show whether plaque is narrowing or blocking your heart's arteries. This procedure is also used to evaluate the valves, as well as measure the blood flow and oxygen levels in different parts of your heart. For further information please visit [email or web address] Please follow instruction sheet, as given.

## 2011-02-20 NOTE — Progress Notes (Signed)
[name] is an 41 y.o. male.   Chief Complaint: Chest pain HPI: This 41 year old gentleman is seen as a work in in the office today.  He had cramps in from his automobile stating that he was having more chest pain.  He states he's been having intermittent chest pain for the past 2 weeks.  He was driving back from [place] today on his way to [place] and came to our office.  The patient does not have any history of known coronary disease.  He did have an Myoview stress test in [place] in May or June 2012 which showed no reversible ischemia and  basilar scarring could not be totally excluded.  The patient has a history of high blood pressure and has been followed in the [place] office.  He last saw Samara Deist in about June 2012.  The patient has a remote history of paroxysmal supraventricular tachycardia and had an ablation by Dr. Graciela Husbands in 2006.  Postoperatively he complained of chest pains and had an echocardiogram which was unremarkable and specifically showed no pericardial disease.  The patient has a history of hyperlipidemia but was intolerant to statins.  He does take omega-3 fish oil capsule daily.  He has been on long-term beta blocker in the form of Toprol-XL 100 mg daily.  He has not been on an ACE inhibitor or an aspirin.  He was given sublingual nitroglycerin in 2006 but has not had a renewed and does not have any at the present time. Of note is the fact that his family history is positive for coronary disease.  His father is alive at age 67 and has had 2 heart attacks.  He has a 83 year old brother who has had a coronary stent following a heart attack.  Past Medical History  Diagnosis Date  . Hypertension   . History of PSVT (paroxysmal supraventricular tachycardia)     s/p ablation 2006.  Marland Kitchen Hyperlipidemia     Intolerant to statins causing tongue swelling  . Sleep apnea     Wears CPAP intermittantly. Followed by Dr. Gildardo Cranker Pulmonology.    No past surgical history on  file.  Family History  Problem Relation Age of Onset  . Heart disease Father   . Heart attack Father   . Heart disease Paternal Grandfather    Social History:  reports that he has been smoking Cigarettes.  He has a 23 pack-year smoking history. He has never used smokeless tobacco. He reports that he does not drink alcohol or use illicit drugs.  Allergies:  Allergies  Allergen Reactions  . Fenofibrate   . Simvastatin     Medications Prior to Admission  Medication Sig Dispense Refill  . esomeprazole (NEXIUM) 40 MG capsule Take 40 mg by mouth daily before breakfast.        . metoprolol (TOPROL-XL) 100 MG 24 hr tablet Take 100 mg by mouth daily.        . Omega-3 Fatty Acids (FISH OIL) 1000 MG CAPS Take 1,000 capsules by mouth daily.         No current facility-administered medications on file as of 02/20/2011.    No results found for this or any previous visit (from the past 48 hour(s)). No results found.  The patient has been experiencing intermittent chest pain for the past several weeks.  The pain is localized to a small area just medial to the left nipple.  It does not radiate.  Today the patient had more severe chest discomfort associated with  some blurring of his vision and diaphoresis and slight nausea.  His symptoms were such that he had to pull his car over to the side of the road until his symptoms improved before he was able to continue driving on.  The patient has not been experiencing any exacerbation of symptoms of mild chronic GERD for which he takes Nexium.  The patient does have sleep apnea but has had no recent respiratory symptoms.  His last chest x-ray was on 08/23/10 and was normal.  Other systems negative in detail.  Blood pressure 130/98, pulse 88, height 6' (1.829 m), weight 244 lb (110.678 kg). The head and neck exam reveals pupils equal and reactive.  Extraocular movements are full.  There is no scleral icterus.  The mouth and pharynx are normal.  The neck is  supple.  The carotids reveal no bruits.  The jugular venous pressure is normal.  The  thyroid is not enlarged.  There is no lymphadenopathy.  The chest is clear to percussion and auscultation.  There are no rales or rhonchi.  Expansion of the chest is symmetrical.  The precordium is quiet.  The first heart sound is normal.  The second heart sound is physiologically split.  There is no murmur gallop rub or click.  There is no abnormal lift or heave.  The abdomen is soft and nontender.  The bowel sounds are normal.  The liver and spleen are not enlarged.  There are no abdominal masses.  There are no abdominal bruits.  Extremities reveal good pedal pulses.  There is no phlebitis or edema.  There is no cyanosis or clubbing.  The patient has good radial and ulnar pulses bilaterally.  Strength is normal and symmetrical in all extremities.  There is no lateralizing weakness.  There are no sensory deficits.  The skin is warm and dry.  There is no rash.  His electrocardiogram today shows normal sinus rhythm and in no acute ischemic changes.  He does have very narrow Q waves in the inferior and lateral leads which are unchanged from prior tracings.  Assessment/Plan 1.  chest pain. 2. poorly controlled hypertension 3. Hypercholesterolemia 4. ongoing smoking one pack a day 5. sleep apnea 6. remote history of successful ablation of paroxysmal supraventricular tachycardia in 2006 by Dr. Graciela Husbands  Plan: The patient will undergo left heart cardiac catheterization/possible PCI on Thursday morning 02/21/2011 by Dr. Tonny Bollman.  Blood work was obtained preoperatively today in the office.  Today we are continuing his beta blocker and adding lisinopril 5 mg daily and adding aspirin 325 mg daily and adding Nitrostat 0.4 mg sublingually when necessary for severe chest discomfort.  The patient was counseled for smoking cessation.  The risks and benefits of cardiac catheterization were discussed with the patient and Dr.  Patty Sermons and patient is agreeable to proceed as planned. [place] 02/20/2011, 1:30 PM

## 2011-02-21 ENCOUNTER — Ambulatory Visit (HOSPITAL_COMMUNITY)
Admission: RE | Admit: 2011-02-21 | Discharge: 2011-02-21 | Disposition: A | Discharge status: Home / Self Care | Payer: Self-pay | Source: Ambulatory Visit | Attending: Cardiovascular Disease | Admitting: Cardiovascular Disease

## 2011-02-21 ENCOUNTER — Encounter (HOSPITAL_COMMUNITY)
Admission: RE | Disposition: A | Discharge status: Home / Self Care | Payer: Self-pay | Source: Ambulatory Visit | Attending: Cardiovascular Disease

## 2011-02-21 DIAGNOSIS — Z8249 Family history of ischemic heart disease and other diseases of the circulatory system: Secondary | ICD-10-CM | POA: Insufficient documentation

## 2011-02-21 DIAGNOSIS — F17200 Nicotine dependence, unspecified, uncomplicated: Secondary | ICD-10-CM | POA: Insufficient documentation

## 2011-02-21 DIAGNOSIS — R0789 Other chest pain: Secondary | ICD-10-CM | POA: Insufficient documentation

## 2011-02-21 DIAGNOSIS — I10 Essential (primary) hypertension: Secondary | ICD-10-CM | POA: Insufficient documentation

## 2011-02-21 DIAGNOSIS — E785 Hyperlipidemia, unspecified: Secondary | ICD-10-CM | POA: Insufficient documentation

## 2011-02-21 DIAGNOSIS — I2510 Atherosclerotic heart disease of native coronary artery without angina pectoris: Secondary | ICD-10-CM

## 2011-02-21 DIAGNOSIS — Z79899 Other long term (current) drug therapy: Secondary | ICD-10-CM | POA: Insufficient documentation

## 2011-02-21 DIAGNOSIS — G4730 Sleep apnea, unspecified: Secondary | ICD-10-CM | POA: Insufficient documentation

## 2011-02-21 HISTORY — PX: LEFT HEART CATHETERIZATION WITH CORONARY ANGIOGRAM: SHX5451

## 2011-02-21 SURGERY — LEFT HEART CATHETERIZATION WITH CORONARY ANGIOGRAM
Anesthesia: LOCAL

## 2011-02-21 MED ORDER — SODIUM CHLORIDE 0.9 % IV SOLN: 250.0000 mL | INTRAVENOUS | Status: DC | PRN

## 2011-02-21 MED ORDER — ASPIRIN 81 MG PO CHEW
324.0000 mg | CHEWABLE_TABLET | ORAL | Status: DC
  Filled 2011-02-21: qty 4

## 2011-02-21 MED ORDER — ONDANSETRON HCL 4 MG/2ML IJ SOLN: 4.0000 mg | Freq: Four times a day (QID) | INTRAMUSCULAR | Status: DC | PRN

## 2011-02-21 MED ORDER — OXYCODONE-ACETAMINOPHEN 5-325 MG PO TABS
ORAL_TABLET | ORAL | Status: AC
  Filled 2011-02-21: qty 1

## 2011-02-21 MED ORDER — ACETAMINOPHEN 325 MG PO TABS: 650.0000 mg | ORAL_TABLET | ORAL | Status: DC | PRN

## 2011-02-21 MED ORDER — FENTANYL CITRATE 0.05 MG/ML IJ SOLN
INTRAMUSCULAR | Status: AC
  Filled 2011-02-21: qty 2

## 2011-02-21 MED ORDER — SODIUM CHLORIDE 0.9 % IV SOLN
INTRAVENOUS | Status: DC
  Administered 2011-02-21: 07:00:00 via INTRAVENOUS

## 2011-02-21 MED ORDER — SODIUM CHLORIDE 0.9 % IJ SOLN: 3.0000 mL | INTRAMUSCULAR | Status: DC | PRN

## 2011-02-21 MED ORDER — SODIUM CHLORIDE 0.9 % IJ SOLN: 3.0000 mL | Freq: Two times a day (BID) | INTRAMUSCULAR | Status: DC

## 2011-02-21 MED ORDER — MIDAZOLAM HCL 2 MG/2ML IJ SOLN
INTRAMUSCULAR | Status: AC
  Filled 2011-02-21: qty 2

## 2011-02-21 MED ORDER — SODIUM CHLORIDE 0.9 % IV SOLN: 1.0000 mL/kg/h | INTRAVENOUS | Status: DC

## 2011-02-21 MED ORDER — DIAZEPAM 5 MG PO TABS
5.0000 mg | ORAL_TABLET | ORAL | Status: AC
  Administered 2011-02-21: 5 mg via ORAL
  Filled 2011-02-21: qty 1

## 2011-02-21 MED ORDER — OXYCODONE-ACETAMINOPHEN 5-325 MG PO TABS
1.0000 | ORAL_TABLET | ORAL | Status: DC | PRN
  Administered 2011-02-21: 1 via ORAL

## 2011-02-21 NOTE — H&P (View-Only) (Signed)
[name] is an 41 y.o. male.   Chief Complaint: Chest pain HPI: This 41-year-old gentleman is seen as a work in in the office today.  He had cramps in from his automobile stating that he was having more chest pain.  He states he's been having intermittent chest pain for the past 2 weeks.  He was driving back from [place] today on his way to [place] and came to our office.  The patient does not have any history of known coronary disease.  He did have an Myoview stress test in [place] in May or June 2012 which showed no reversible ischemia and  basilar scarring could not be totally excluded.  The patient has a history of high blood pressure and has been followed in the [place] office.  He last saw Kathryn in about June 2012.  The patient has a remote history of paroxysmal supraventricular tachycardia and had an ablation by Dr. Klein in 2006.  Postoperatively he complained of chest pains and had an echocardiogram which was unremarkable and specifically showed no pericardial disease.  The patient has a history of hyperlipidemia but was intolerant to statins.  He does take omega-3 fish oil capsule daily.  He has been on long-term beta blocker in the form of Toprol-XL 100 mg daily.  He has not been on an ACE inhibitor or an aspirin.  He was given sublingual nitroglycerin in 2006 but has not had a renewed and does not have any at the present time. Of note is the fact that his family history is positive for coronary disease.  His father is alive at age 62 and has had 2 heart attacks.  He has a 41-year-old brother who has had a coronary stent following a heart attack.  Past Medical History  Diagnosis Date  . Hypertension   . History of PSVT (paroxysmal supraventricular tachycardia)     s/p ablation 2006.  . Hyperlipidemia     Intolerant to statins causing tongue swelling  . Sleep apnea     Wears CPAP intermittantly. Followed by Dr. Wright Doffing Pulmonology.    No past surgical history on  file.  Family History  Problem Relation Age of Onset  . Heart disease Father   . Heart attack Father   . Heart disease Paternal Grandfather    Social History:  reports that he has been smoking Cigarettes.  He has a 23 pack-year smoking history. He has never used smokeless tobacco. He reports that he does not drink alcohol or use illicit drugs.  Allergies:  Allergies  Allergen Reactions  . Fenofibrate   . Simvastatin     Medications Prior to Admission  Medication Sig Dispense Refill  . esomeprazole (NEXIUM) 40 MG capsule Take 40 mg by mouth daily before breakfast.        . metoprolol (TOPROL-XL) 100 MG 24 hr tablet Take 100 mg by mouth daily.        . Omega-3 Fatty Acids (FISH OIL) 1000 MG CAPS Take 1,000 capsules by mouth daily.         No current facility-administered medications on file as of 02/20/2011.    No results found for this or any previous visit (from the past 48 hour(s)). No results found.  The patient has been experiencing intermittent chest pain for the past several weeks.  The pain is localized to a small area just medial to the left nipple.  It does not radiate.  Today the patient had more severe chest discomfort associated with   some blurring of his vision and diaphoresis and slight nausea.  His symptoms were such that he had to pull his car over to the side of the road until his symptoms improved before he was able to continue driving on.  The patient has not been experiencing any exacerbation of symptoms of mild chronic GERD for which he takes Nexium.  The patient does have sleep apnea but has had no recent respiratory symptoms.  His last chest x-ray was on 08/23/10 and was normal.  Other systems negative in detail.  Blood pressure 130/98, pulse 88, height 6' (1.829 m), weight 244 lb (110.678 kg). The head and neck exam reveals pupils equal and reactive.  Extraocular movements are full.  There is no scleral icterus.  The mouth and pharynx are normal.  The neck is  supple.  The carotids reveal no bruits.  The jugular venous pressure is normal.  The  thyroid is not enlarged.  There is no lymphadenopathy.  The chest is clear to percussion and auscultation.  There are no rales or rhonchi.  Expansion of the chest is symmetrical.  The precordium is quiet.  The first heart sound is normal.  The second heart sound is physiologically split.  There is no murmur gallop rub or click.  There is no abnormal lift or heave.  The abdomen is soft and nontender.  The bowel sounds are normal.  The liver and spleen are not enlarged.  There are no abdominal masses.  There are no abdominal bruits.  Extremities reveal good pedal pulses.  There is no phlebitis or edema.  There is no cyanosis or clubbing.  The patient has good radial and ulnar pulses bilaterally.  Strength is normal and symmetrical in all extremities.  There is no lateralizing weakness.  There are no sensory deficits.  The skin is warm and dry.  There is no rash.  His electrocardiogram today shows normal sinus rhythm and in no acute ischemic changes.  He does have very narrow Q waves in the inferior and lateral leads which are unchanged from prior tracings.  Assessment/Plan 1.  chest pain. 2. poorly controlled hypertension 3. Hypercholesterolemia 4. ongoing smoking one pack a day 5. sleep apnea 6. remote history of successful ablation of paroxysmal supraventricular tachycardia in 2006 by Dr. Klein  Plan: The patient will undergo left heart cardiac catheterization/possible PCI on Thursday morning 02/21/2011 by Dr. Michael Cooper.  Blood work was obtained preoperatively today in the office.  Today we are continuing his beta blocker and adding lisinopril 5 mg daily and adding aspirin 325 mg daily and adding Nitrostat 0.4 mg sublingually when necessary for severe chest discomfort.  The patient was counseled for smoking cessation.  The risks and benefits of cardiac catheterization were discussed with the patient and Dr.  Ashdon Gillson and patient is agreeable to proceed as planned. Zamya Culhane 02/20/2011, 1:30 PM    

## 2011-02-21 NOTE — Interval H&P Note (Signed)
History and Physical Interval Note:  02/21/2011 7:45 AM  [name]  has presented today for surgery, with the diagnosis of Chest pain  The various methods of treatment have been discussed with the patient and family. After consideration of risks, benefits and other options for treatment, the patient has consented to  Procedure(s): LEFT HEART CATHETERIZATION WITH CORONARY ANGIOGRAM as a surgical intervention .  The patients' history has been reviewed, patient examined, no change in status, stable for surgery.  I have reviewed the patients' chart and labs.  Questions were answered to the patient's satisfaction.     Tonny Bollman

## 2011-02-21 NOTE — Op Note (Signed)
Cardiac Catheterization Procedure Note  Name: [name] MRN: [id number] DOB: 05-28-1970  Procedure: Left Heart Cath, Selective Coronary Angiography, LV angiography  Indication: A 41 year old gentleman with strong family history of premature coronary artery disease has presented with progressive chest pain. He was referred for cardiac catheterization because of high pretest probability of obstructive CAD and concerns for unstable angina.   Procedural details: The right groin was prepped, draped, and anesthetized with 1% lidocaine. Using modified Seldinger technique, a 5 French sheath was introduced into the right femoral artery. Standard Judkins catheters were used for coronary angiography and left ventriculography. Catheter exchanges were performed over a guidewire. There were no immediate procedural complications. The patient was transferred to the post catheterization recovery area for further monitoring.  Procedural Findings: Hemodynamics:  AO 93/58 LV 100/7   Coronary angiography: Coronary dominance: right  Left mainstem: Widely patent with no obstructive disease.  Left anterior descending (LAD): The LAD courses to the left ventricular apex. The vessel is widely patent. It supplies a tiny first diagonal and a large second diagonal branch there is no obstructive disease throughout.  Left circumflex (LCx): The circumflex is a large vessel with no obstructive disease. There is a large first obtuse marginal branch that divides into twin vessels. The AV groove circumflex extends to provide a posterolateral branch distally.  Right coronary artery (RCA): The RCA is dominant. The vessel is smooth throughout its course. There is no obstructive disease visualized. There is an acute marginal branch and a PDA branch supplied.  Left ventriculography: Left ventricular systolic function is normal, LVEF is estimated at 55-65%, there is no significant mitral regurgitation   Final Conclusions:     1. No obstructive coronary artery disease with widely patent coronary arteries 2. Normal left ventricular function  Recommendations: Suspect noncardiac chest pain. The patient has no apparent cardiac disease.  Tonny Bollman 02/21/2011, 8:15 AM

## 2011-02-25 ENCOUNTER — Encounter: Payer: Self-pay | Admitting: Adult Health

## 2011-02-25 ENCOUNTER — Telehealth: Payer: Self-pay | Admitting: Cardiology

## 2011-02-25 ENCOUNTER — Ambulatory Visit (INDEPENDENT_AMBULATORY_CARE_PROVIDER_SITE_OTHER): Payer: Self-pay | Admitting: Adult Health

## 2011-02-25 DIAGNOSIS — I498 Other specified cardiac arrhythmias: Secondary | ICD-10-CM

## 2011-02-25 DIAGNOSIS — I10 Essential (primary) hypertension: Secondary | ICD-10-CM

## 2011-02-25 DIAGNOSIS — R000 Tachycardia, unspecified: Secondary | ICD-10-CM

## 2011-02-25 MED ORDER — NITROGLYCERIN 0.4 MG SL SUBL: 0.4000 mg | SUBLINGUAL_TABLET | SUBLINGUAL | Status: DC | PRN

## 2011-02-25 MED ORDER — METOPROLOL SUCCINATE ER 100 MG PO TB24: ORAL_TABLET | ORAL | Status: DC

## 2011-02-25 NOTE — Telephone Encounter (Signed)
New Problem   Patient called complained of chest pains since Friday night, shortness of breath.  Patient has taken Nitro pain went away.  Patient wants to speak with nurse, he can be reached at hm#

## 2011-02-25 NOTE — Assessment & Plan Note (Signed)
Well controlled. Will watch with additional dose of metoprolol at HS.

## 2011-02-25 NOTE — Patient Instructions (Signed)
**Note De-Identified [name] Obfuscation** Your physician has recommended you make the following change in your medication: start taking Metoprolol 100 mg (1 whole tablet) in the mornings and 50 mg (1/2 tablet) in the evenings.   Your providers recommends that you see Dr. Graciela Husbands in our [place] office.  Your physician recommends that you schedule a follow-up appointment in: 3 months

## 2011-02-25 NOTE — Progress Notes (Signed)
   HPI: [name] is a 41 y/o patient of Dr.Rothbart with recent admission to [facility] hospital for recurrent chest pain. He had cardiac cath on 02/21/2011 demonstrating widely patent coronary arteries. He was seen by Dr. Patty Sermons just prior to the cath because of chest pain at rest that occurred while driving. He also has a history of Invasive electrophysiologic study, isoproterenol  infusion, arrhythmia mapping and radiofrequency catheter ablation in June of 2006. He states that he is experiencing the same symptoms he was feeling prior to the ablation procedure. His most recent episode occurred 2 days ago after walking lasting several minutes and then going away on its own. He called our office and was directed to see [place] in the office today. He states that he takes NTG for chest discomfort that occurs with the racing heart rate. There is relief of pain with its use.    Allergies  Allergen Reactions  . Fenofibrate   . Simvastatin     Current Outpatient Prescriptions  Medication Sig Dispense Refill  . aspirin 325 MG tablet Take 325 mg by mouth daily.      Marland Kitchen esomeprazole (NEXIUM) 40 MG capsule Take 40 mg by mouth daily before breakfast.        . lisinopril (PRINIVIL,ZESTRIL) 5 MG tablet Take 1 tablet (5 mg total) by mouth daily.  30 tablet  11  . metoprolol (TOPROL-XL) 100 MG 24 hr tablet Take 100 mg by mouth daily.        . nitroGLYCERIN (NITROSTAT) 0.4 MG SL tablet Place 0.4 mg under the tongue every 5 (five) minutes as needed.      . Omega-3 Fatty Acids (FISH OIL) 1000 MG CAPS Take 1,000 capsules by mouth daily.          Past Medical History  Diagnosis Date  . Hypertension   . History of PSVT (paroxysmal supraventricular tachycardia)     s/p ablation 2006.  Marland Kitchen Hyperlipidemia     Intolerant to statins causing tongue swelling  . Sleep apnea     Wears CPAP intermittantly. Followed by Dr. Gildardo Cranker Pulmonology.    No past surgical history on file.  ROS: Review of systems complete and  found to be negative unless listed above PHYSICAL EXAM BP 120/81  Pulse 70  Resp 18  Ht 6' (1.829 m)  Wt 244 lb (110.678 kg)  BMI 33.09 kg/m2  General: Well developed, well nourished, in no acute distress Head: Eyes PERRLA, No xanthomas.   Normal cephalic and atramatic  Lungs: Clear bilaterally to auscultation and percussion. Heart: HRRR S1 S2, without MRG.  Pulses are 2+ & equal.            No carotid bruit. No JVD.  No abdominal bruits. No femoral bruits. Abdomen: Bowel sounds are positive, abdomen soft and non-tender without masses or                  Hernia's noted. Msk:  Back normal, normal gait. Normal strength and tone for age. Extremities: No clubbing, cyanosis or edema. Right groin ecchymosis, old is noted At cath site. He does not have a bruit or hematoma on exam.  DP +1 Neuro: Alert and oriented X 3. Psych:  Good affect, responds appropriately  EKG: NSR rate of 73 bpm  ASSESSMENT AND PLAN

## 2011-02-25 NOTE — Telephone Encounter (Signed)
Discussed with  Dr. Patty Sermons and will have patient contact primary cardiologist [facility] office).  Cardiac cath on 02/21/11  was normal.  Advised patient to call there office, verbalized understanding.  Advised patient would forward message to Reather Laurence RN at the office

## 2011-02-25 NOTE — Assessment & Plan Note (Signed)
He has recurrent complaints of tachycardia despite use of metoprolol 100 mg daily. I have increased his dose to 100 mg in am  and 50 mg at HS. He requests to be seen again by Dr. Graciela Husbands for recommendations of need for additional testing.  He becomes quite anxious with recurrent tachycardia. Uncertain if his history of anxiety is playing into this. Cardiac cath results are reassuring. He is reminded of his cath report to help alleviate some fears.

## 2011-03-11 ENCOUNTER — Ambulatory Visit (INDEPENDENT_AMBULATORY_CARE_PROVIDER_SITE_OTHER): Payer: Self-pay | Admitting: Internal Medicine

## 2011-03-11 ENCOUNTER — Encounter: Payer: Self-pay | Admitting: Internal Medicine

## 2011-03-11 VITALS — BP 100/70 | HR 80 | Ht 72.0 in | Wt 250.5 lb

## 2011-03-11 DIAGNOSIS — R002 Palpitations: Secondary | ICD-10-CM

## 2011-03-11 NOTE — Progress Notes (Signed)
History and Physical  Patient ID: [name] MRN: [id number], SOB: 1970/01/26 40 y.o. Date of Encounter: 03/11/2011, 3:11 PM  Primary Physician: No primary provider on file. Primary Cardiologist: RR Primary Electrophysiologist:  SK  Chief Complaint: palpitations  History of Present Illness: [name] is a 41 y.o. male seen at his request because of recurrent tachypalpitations.  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  B/C of this he underwent cath a few weeks ago demonstrating normal LV function and clean Coronaries.    These spells, he thinks are similar to those preablation, gradual in onset lasting 10-15 minutes with midsternal palpitations  About 8 months ago, he separated from his wife and has been living by himself. He does not think this has added to his significant stress which has prompted him to take himself out of work.   He smokes just a bit, but no ETOH and drugs  Lisinopril was started for BP but assoc with nausea.  Metoprolol succinate has been around for years without untoward side effects   Past Medical History  Diagnosis Date  . Hypertension   . History of PSVT (paroxysmal supraventricular tachycardia)     s/p ablation 2006.  Marland Kitchen Hyperlipidemia     Intolerant to statins causing tongue swelling  . Sleep apnea     Wears CPAP intermittantly. Followed by Dr. Gildardo Cranker Pulmonology.     No past surgical history on file.    Current Outpatient Prescriptions  Medication Sig Dispense Refill  . aspirin 325 MG tablet Take 325 mg by mouth daily.      Marland Kitchen esomeprazole (NEXIUM) 40 MG capsule Take 40 mg by mouth daily before breakfast.        . lisinopril (PRINIVIL,ZESTRIL) 5 MG tablet Take 1 tablet (5 mg total) by mouth daily.  30 tablet  11  . metoprolol succinate (TOPROL-XL) 100 MG 24 hr tablet Take 100 mg (1 whole tablet) in the  mornings and 50 mg (1/2 tablet) in the evenings.  45 tablet  3  . nitroGLYCERIN (NITROSTAT) 0.4 MG SL tablet Place 1 tablet (0.4 mg total) under the tongue every 5 (five) minutes as needed.  25 tablet  3  . Omega-3 Fatty Acids (FISH OIL) 1000 MG CAPS Take 1,000 capsules by mouth daily.           Allergies: Allergies  Allergen Reactions  . Fenofibrate   . Simvastatin      History  Substance Use Topics  . Smoking status: Current Everyday Smoker -- 1.0 packs/day for 23 years    Types: Cigarettes  . Smokeless tobacco: Never Used   Comment: down to 3 a day  . Alcohol Use: No      Family History  Problem Relation Age of Onset  . Heart disease Father   . Heart attack Father   . Heart disease Paternal Grandfather       ROS:  Please see the history of present illness All other systems reviewed and negative.   Vital Signs: Blood pressure 100/70, pulse 80, height 6' (1.829 m), weight 250 lb 8 oz (113.626 kg).  PHYSICAL EXAM: General:  Well nourished, well developed male in no acute distress HEENT: normal Lymph: no adenopathy Neck: no JVD Endocrine:  No thryomegaly Vascular: No carotid bruits; FA pulses 2+ bilaterally without bruits Cardiac:  normal S1, S2;  RRR; no murmur Back: without kyphosis/scoliosis, no CVA tenderness Lungs:  clear to auscultation bilaterally, no wheezing, rhonchi or rales Abd: soft, nontender, no hepatomegaly Ext: no edema Musculoskeletal:  No deformities, BUE and BLE strength normal and equal Skin: warm and dry Neuro:  CNs 2-12 intact, no focal abnormalities noted Psych: A ffect flat  EKG: sinus rhythm with q waves in the inferior leads  Labs:   Lab Results  Component Value Date   WBC 8.3 02/20/2011   HGB 16.4 02/20/2011   HCT 47.6 02/20/2011   MCV 91.6 02/20/2011   PLT 305.0 02/20/2011   No results found for this basename: NA,K,CL,CO2,BUN,CREATININE,CALCIUM,LABALBU,PROT,BILITOT,ALKPHOS,ALT,AST,GLUCOSE in the last 168 hours No results found for  this basename: CKTOTAL:4,CKMB:4,TROPONINI:4 in the last 72 hours Lab Results  Component Value Date   CHOL 240* 05/22/2010   HDL 34* 05/22/2010   LDLCALC 133* 05/22/2010   TRIG 364* 05/22/2010   Lab Results  Component Value Date   DDIMER 0.30 08/23/2010   BNP No results found for this basename: probnp       ASSESSMENT AND PLAN:    Duke Salvia, MD  03/11/2011, 3:11 PM

## 2011-03-11 NOTE — Assessment & Plan Note (Signed)
The patient has palpitations which do not sound like AV nodal reentry to me. The recording will help elucidate the mechanism however and given the frequency it should be easy to do. In the event that it is a reentrant or abnormal rhythm catheter ablation could be considered. If it is sinus rhythm I have asked him to consider whether stress related to separation possibly work may be contributing to his symptom complex.  Will also check a thyroid.  I will defer management of his lipid disorder primary care and primary cardiology

## 2011-03-11 NOTE — Patient Instructions (Addendum)
Your physician has recommended that you wear an event monitor. Event monitors are medical devices that record the heart's electrical activity. Doctors most often [place] these monitors to diagnose arrhythmias. Arrhythmias are problems with the speed or rhythm of the heartbeat. The monitor is a small, portable device. You can wear one while you do your normal daily activities. This is usually used to diagnose what is causing palpitations/syncope (passing out).  Your physician recommends that you return for lab work in: today [facility])  Your physician has recommended you make the following change in your medication: Stop Lisinopril.

## 2011-03-12 ENCOUNTER — Telehealth: Payer: Self-pay | Admitting: Internal Medicine

## 2011-03-12 NOTE — Telephone Encounter (Signed)
New Msg: Pt calling wanting to speak with nurse regarding MD ordering pt holter monitor. Pt stated he doesn't have insurance and wanted to discuss his options with MD. Please return pt call to discuss further.

## 2011-03-12 NOTE — Telephone Encounter (Signed)
I will review this with Dr. Graciela Husbands prior to calling the patient back.

## 2011-03-13 ENCOUNTER — Telehealth: Payer: Self-pay

## 2011-03-13 LAB — BASIC METABOLIC PANEL
BUN: 12 mg/dL (ref 6–23)
CO2: 24 mEq/L (ref 19–32)
Calcium: 9.4 mg/dL (ref 8.4–10.5)
Creatinine, Ser: 1 mg/dL (ref 0.4–1.5)
GFR: 86.11 mL/min (ref >60.00)
Glucose, Bld: 119 mg/dL — ABNORMAL HIGH (ref 70–99)
Sodium: 138 mEq/L (ref 135–145)

## 2011-03-13 NOTE — Telephone Encounter (Signed)
This patient fill out hardship paper work for the monitor and [employer] will call patient if he get apprived for the monitor and  This monitor will be mail out to patient.

## 2011-03-13 NOTE — Telephone Encounter (Signed)
Event recording is designed to elucidate mechanism of a\palpitations  There is no other way to do this whenever his finances can afford

## 2011-03-13 NOTE — Progress Notes (Signed)
Addended by: Kyleen Villatoro, KEITH K on: 03/13/2011 08:39 AM   Modules accepted: Orders  

## 2011-03-13 NOTE — Progress Notes (Signed)
Addended by: Alma Friendly on: 03/13/2011 08:39 AM   Modules accepted: Orders

## 2011-03-15 NOTE — Telephone Encounter (Signed)
[name] spoke with the patient on 2/20 and hardship paperwork was mailed out to him. See 2/20 phone note from [place].

## 2011-03-19 ENCOUNTER — Telehealth: Payer: Self-pay | Admitting: *Deleted

## 2011-03-19 NOTE — Telephone Encounter (Signed)
A call was place to [employer] concerning [name]  Event Monitor. Per Francisca December, Mr. Torbeck hardship application was approved and the monitor was mailed out today. Patient is aware.

## 2011-03-26 NOTE — Telephone Encounter (Signed)
[name] received his 30 event monitor by mail on 03/21/11. He is currently in the hardship program with [employer].

## 2011-03-28 ENCOUNTER — Institutional Professional Consult (permissible substitution): Payer: Self-pay | Admitting: Internal Medicine

## 2011-05-02 ENCOUNTER — Ambulatory Visit (INDEPENDENT_AMBULATORY_CARE_PROVIDER_SITE_OTHER): Payer: Self-pay | Admitting: Internal Medicine

## 2011-05-02 ENCOUNTER — Encounter: Payer: Self-pay | Admitting: Internal Medicine

## 2011-05-02 VITALS — BP 120/80 | HR 70 | Ht 72.0 in | Wt 246.8 lb

## 2011-05-02 DIAGNOSIS — R002 Palpitations: Secondary | ICD-10-CM

## 2011-05-02 NOTE — Progress Notes (Signed)
  HPI  [name] is a 41 y.o. male His follow for tachycardia palpitations. History of remote catheter ablation 2006. He was given a recorder at his last visit. We reviewed that today. He 1 patient activated events which he says is typical was associated with sinus rhythm.  Blood work obtained at the last visit showed normal TSH and normal electrolytes as well as CBC Past Medical History  Diagnosis Date  . Hypertension   . History of PSVT (paroxysmal supraventricular tachycardia)     s/p ablation 2006.  Marland Kitchen Hyperlipidemia     Intolerant to statins causing tongue swelling  . Sleep apnea     Wears CPAP intermittantly. Followed by Dr. Gildardo Cranker Pulmonology.    No past surgical history on file.  Current Outpatient Prescriptions  Medication Sig Dispense Refill  . aspirin 325 MG tablet Take 325 mg by mouth daily.      Marland Kitchen esomeprazole (NEXIUM) 40 MG capsule Take 40 mg by mouth daily before breakfast.        . metoprolol succinate (TOPROL-XL) 100 MG 24 hr tablet Take 100 mg (1 whole tablet) in the mornings and 50 mg (1/2 tablet) in the evenings.  45 tablet  3  . nitroGLYCERIN (NITROSTAT) 0.4 MG SL tablet Place 1 tablet (0.4 mg total) under the tongue every 5 (five) minutes as needed.  25 tablet  3  . Omega-3 Fatty Acids (FISH OIL) 1000 MG CAPS Take 1,000 capsules by mouth daily.          Allergies  Allergen Reactions  . Fenofibrate   . Simvastatin     Review of Systems negative except from HPI and PMH  Physical Exam BP 120/80  Pulse 70  Ht 6' (1.829 m)  Wt 246 lb 12.8 oz (111.948 kg)  BMI 33.47 kg/m2 Well developed and well nourished in no acute distress HENT normal E scleral and icterus clear Neck Supple JVP flat; carotids brisk and full Clear to ausculation Regular rate and rhythm, no murmurs gallops or rub Soft with active bowel sounds No clubbing cyanosis none Edema Alert and oriented, grossly normal motor and sensory function Skin Warm and Dry  It recorder  demonstrates only sinus rhythm with heart rates ranging from the 70s to 130s or so.  Assessment and  Plan

## 2011-05-02 NOTE — Patient Instructions (Signed)
Your physician wants you to follow-up in: 2 years with Dr. Klein. You will receive a reminder letter in the mail two months in advance. If you don't receive a letter, please call our office to schedule the follow-up appointment.  Your physician recommends that you continue on your current medications as directed. Please refer to the Current Medication list given to you today.  

## 2011-05-02 NOTE — Assessment & Plan Note (Signed)
His event recorder was reviewed in detail. There was only sinus rhythm. We discussed the potential role of caffeine and stress is aggravating factors. He will work on reduction of these  We'll also decreases metoprolol from 100/50->>50 twice a day

## 2011-05-27 ENCOUNTER — Ambulatory Visit: Payer: Self-pay | Admitting: Cardiology

## 2011-06-12 ENCOUNTER — Ambulatory Visit: Payer: Self-pay | Admitting: Cardiology

## 2011-06-12 ENCOUNTER — Encounter: Payer: Self-pay | Admitting: Cardiology

## 2011-06-12 DIAGNOSIS — G4733 Obstructive sleep apnea (adult) (pediatric): Secondary | ICD-10-CM | POA: Insufficient documentation

## 2011-06-12 DIAGNOSIS — I10 Essential (primary) hypertension: Secondary | ICD-10-CM | POA: Insufficient documentation

## 2011-06-12 DIAGNOSIS — F419 Anxiety disorder, unspecified: Secondary | ICD-10-CM | POA: Insufficient documentation

## 2011-06-12 DIAGNOSIS — F32A Depression, unspecified: Secondary | ICD-10-CM | POA: Insufficient documentation

## 2011-06-12 DIAGNOSIS — E782 Mixed hyperlipidemia: Secondary | ICD-10-CM | POA: Insufficient documentation

## 2011-06-12 DIAGNOSIS — K219 Gastro-esophageal reflux disease without esophagitis: Secondary | ICD-10-CM | POA: Insufficient documentation

## 2011-06-12 DIAGNOSIS — E785 Hyperlipidemia, unspecified: Secondary | ICD-10-CM | POA: Insufficient documentation

## 2011-06-12 DIAGNOSIS — Z8679 Personal history of other diseases of the circulatory system: Secondary | ICD-10-CM | POA: Insufficient documentation

## 2011-06-12 DIAGNOSIS — F329 Major depressive disorder, single episode, unspecified: Secondary | ICD-10-CM | POA: Insufficient documentation

## 2011-10-07 ENCOUNTER — Encounter (HOSPITAL_COMMUNITY): Payer: Self-pay | Admitting: *Deleted

## 2011-10-07 ENCOUNTER — Emergency Department (HOSPITAL_COMMUNITY): Payer: Self-pay

## 2011-10-07 ENCOUNTER — Emergency Department (HOSPITAL_COMMUNITY)
Admission: EM | Admit: 2011-10-07 | Discharge: 2011-10-07 | Disposition: A | Discharge status: Home Health | Payer: Self-pay | Attending: Emergency Medicine | Admitting: Emergency Medicine

## 2011-10-07 DIAGNOSIS — F341 Dysthymic disorder: Secondary | ICD-10-CM | POA: Insufficient documentation

## 2011-10-07 DIAGNOSIS — F17200 Nicotine dependence, unspecified, uncomplicated: Secondary | ICD-10-CM | POA: Insufficient documentation

## 2011-10-07 DIAGNOSIS — R079 Chest pain, unspecified: Secondary | ICD-10-CM | POA: Insufficient documentation

## 2011-10-07 DIAGNOSIS — E785 Hyperlipidemia, unspecified: Secondary | ICD-10-CM | POA: Insufficient documentation

## 2011-10-07 DIAGNOSIS — I10 Essential (primary) hypertension: Secondary | ICD-10-CM | POA: Insufficient documentation

## 2011-10-07 DIAGNOSIS — R51 Headache: Secondary | ICD-10-CM | POA: Insufficient documentation

## 2011-10-07 DIAGNOSIS — G4733 Obstructive sleep apnea (adult) (pediatric): Secondary | ICD-10-CM | POA: Insufficient documentation

## 2011-10-07 DIAGNOSIS — Z79899 Other long term (current) drug therapy: Secondary | ICD-10-CM | POA: Insufficient documentation

## 2011-10-07 DIAGNOSIS — R42 Dizziness and giddiness: Secondary | ICD-10-CM | POA: Insufficient documentation

## 2011-10-07 DIAGNOSIS — Z7982 Long term (current) use of aspirin: Secondary | ICD-10-CM | POA: Insufficient documentation

## 2011-10-07 DIAGNOSIS — I509 Heart failure, unspecified: Secondary | ICD-10-CM | POA: Insufficient documentation

## 2011-10-07 LAB — PROTIME-INR
INR: 0.93 (ref 0.00–1.49)
Prothrombin Time: 12.7 seconds (ref 11.6–15.2)

## 2011-10-07 LAB — COMPREHENSIVE METABOLIC PANEL
Albumin: 3.8 g/dL (ref 3.5–5.2)
BUN: 11 mg/dL (ref 6–23)
Calcium: 9.3 mg/dL (ref 8.4–10.5)
GFR calc Af Amer: >90 mL/min (ref >90)
Glucose, Bld: 94 mg/dL (ref 70–99)
Potassium: 3.9 mEq/L (ref 3.5–5.1)
Total Protein: 7.3 g/dL (ref 6.0–8.3)

## 2011-10-07 LAB — CBC
HCT: 47 % (ref 39.0–52.0)
Hemoglobin: 16.4 g/dL (ref 13.0–17.0)
MCH: 31.3 pg (ref 26.0–34.0)
MCHC: 34.9 g/dL (ref 30.0–36.0)
RDW: 14 % (ref 11.5–15.5)

## 2011-10-07 LAB — POCT I-STAT TROPONIN I: Troponin i, poc: 0 ng/mL (ref 0.00–0.08)

## 2011-10-07 LAB — APTT: aPTT: 28 seconds (ref 24–37)

## 2011-10-07 MED ORDER — MORPHINE SULFATE 4 MG/ML IJ SOLN
4.0000 mg | Freq: Once | INTRAMUSCULAR | Status: AC
  Administered 2011-10-07: 4 mg via INTRAVENOUS
  Filled 2011-10-07: qty 1

## 2011-10-07 MED ORDER — ASPIRIN 81 MG PO CHEW: 324.0000 mg | CHEWABLE_TABLET | Freq: Once | ORAL | Status: DC

## 2011-10-07 MED ORDER — SODIUM CHLORIDE 0.9 % IV SOLN
1000.0000 mL | INTRAVENOUS | Status: DC
  Administered 2011-10-07: 1000 mL via INTRAVENOUS

## 2011-10-07 MED ORDER — MECLIZINE HCL 25 MG PO TABS: 25.0000 mg | ORAL_TABLET | Freq: Three times a day (TID) | ORAL | Status: DC | PRN

## 2011-10-07 MED ORDER — OXYCODONE-ACETAMINOPHEN 5-325 MG PO TABS
1.0000 | ORAL_TABLET | Freq: Once | ORAL | Status: AC
  Administered 2011-10-07: 1 via ORAL
  Filled 2011-10-07: qty 1

## 2011-10-07 MED ORDER — NITROGLYCERIN 2 % TD OINT
1.0000 [in_us] | TOPICAL_OINTMENT | Freq: Once | TRANSDERMAL | Status: AC
  Administered 2011-10-07: 1 [in_us] via TOPICAL
  Filled 2011-10-07: qty 1

## 2011-10-07 MED ORDER — MECLIZINE HCL 25 MG PO TABS
50.0000 mg | ORAL_TABLET | Freq: Once | ORAL | Status: AC
Start: 2011-10-07 — End: 2011-10-07
  Administered 2011-10-07: 50 mg via ORAL
  Filled 2011-10-07: qty 2

## 2011-10-07 NOTE — ED Provider Notes (Addendum)
History     CSN: [id number]  Arrival date & time 10/07/11  1807   First MD Initiated Contact with Patient 10/07/11 1809      Chief Complaint  Patient presents with  . Chest Pain    (Consider location/radiation/quality/duration/timing/severity/associated sxs/prior treatment) Patient is a 41 y.o. male presenting with chest pain. The history is provided by the patient.  Chest Pain Episode onset: today. Duration of episode(s) is 3 minutes. Chest pain occurs intermittently. The chest pain is resolved. The severity of the pain is severe. The quality of the pain is described as sharp. The pain does not radiate. Primary symptoms include nausea and dizziness. Pertinent negatives for primary symptoms include no fatigue, no syncope, no cough and no vomiting.  Dizziness also occurs with nausea. Dizziness does not occur with vomiting.  He tried nitroglycerin for the symptoms.  Pertinent negatives for past medical history include no CAD, no MI and no PE.    Pt started feeling dizzy today while at work.  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  He was brought in by EMS.  He has had a headache as well.  He has history of a heart ablation in 2006. Past Medical History  Diagnosis Date  . Hypertension     Lab 02/2011: Normal CBC and BMet, TSH-1.6  . History of PSVT (paroxysmal supraventricular tachycardia)     s/p ablation 2006; occasional palpitations; occasional atypical left chest pain; event recorder in 03/2011-normal sinus, sinus tachycardia, single symptomatic spell with sinus rhythm  . Hyperlipidemia     Intolerant to statins causing tongue swelling  . Obstructive sleep apnea     Wears CPAP intermittantly. Followed by Dr. Gildardo Cranker Pulmonology.  . Hyperlipidemia     Lipid profile in 2008:225, 770, 27,?; statin allergy-oral edema  . Gastroesophageal reflux disease   . Anxiety and depression     2009-suicidal ideation, involuntary commitment  .  Congestive heart failure ?    Past Surgical History  Procedure Date  . Nasal turbinate reduction   . Shoulder surgery     Right  . Ankle surgery     Right    Family History  Problem Relation Age of Onset  . Heart disease Father   . Heart attack Father   . Heart disease Paternal Grandfather     History  Substance Use Topics  . Smoking status: Current Every Day Smoker -- 1.0 packs/day for 23 years    Types: Cigarettes  . Smokeless tobacco: Never Used   Comment: down to 3 a day  . Alcohol Use: No      Review of Systems  Constitutional: Negative for fatigue.  Respiratory: Negative for cough.   Cardiovascular: Positive for chest pain. Negative for syncope.  Gastrointestinal: Positive for nausea. Negative for vomiting.  Neurological: Positive for dizziness.    Allergies  Fenofibrate and Simvastatin  Home Medications   Current Outpatient Rx  Name Route Sig Dispense Refill  . ASPIRIN 325 MG PO TABS Oral Take 325 mg by mouth daily.    Marland Kitchen ESOMEPRAZOLE MAGNESIUM 40 MG PO CPDR Oral Take 40 mg by mouth daily before breakfast.      . METOPROLOL SUCCINATE ER 100 MG PO TB24  Take 100 mg (1 whole tablet) in the mornings and 50 mg (1/2 tablet) in the evenings. 45 tablet 3    Dose increase  . NITROGLYCERIN 0.4 MG SL SUBL Sublingual Place 1 tablet (0.4 mg total) under the  tongue every 5 (five) minutes as needed. 25 tablet 3  . FISH OIL 1000 MG PO CAPS Oral Take 1,000 capsules by mouth daily.        BP 152/94  Pulse 83  Temp 98.3 F (36.8 C) (Oral)  Resp 16  SpO2 94%  Physical Exam  Nursing note and vitals reviewed. Constitutional: He appears well-developed and well-nourished. No distress.  HENT:  Head: Normocephalic and atraumatic.  Right Ear: External ear normal.  Left Ear: External ear normal.  Eyes: Conjunctivae normal are normal. Right eye exhibits no discharge. Left eye exhibits no discharge. No scleral icterus.  Neck: Neck supple. No tracheal deviation present.    Cardiovascular: Normal rate, regular rhythm and intact distal pulses.   Pulmonary/Chest: Effort normal and breath sounds normal. No stridor. No respiratory distress. He has no wheezes. He has no rales.  Abdominal: Soft. Bowel sounds are normal. He exhibits no distension. There is no tenderness. There is no rebound and no guarding.  Musculoskeletal: He exhibits no edema and no tenderness.  Neurological: He is alert. He has normal strength. No sensory deficit. Cranial nerve deficit:  no gross defecits noted. He exhibits normal muscle tone. He displays no seizure activity. Coordination normal.  Skin: Skin is warm and dry. No rash noted.  Psychiatric: He has a normal mood and affect.    ED Course  Procedures (including critical care time) EKG Rate 84 SINUS RHYTHM ~ normal P axis, V-rate 50- 99 RSR' IN V1 OR V2, RIGHT VCD OR RVH ~ QRS area positive & R' V1/V2 BORDERLINE R WAVE PROGRESSION, ANTERIOR LEADS ~ R < 0.71mV no acute st t changes No sig change from prior EKG Labs Reviewed  CBC - Abnormal; Notable for the following:    WBC 11.0 (*)     All other components within normal limits  COMPREHENSIVE METABOLIC PANEL - Abnormal; Notable for the following:    Total Bilirubin 0.2 (*)     GFR calc non Af Amer 84 (*)     All other components within normal limits  PROTIME-INR  APTT  POCT I-STAT TROPONIN I   Ct Head Wo Contrast  10/07/2011  *RADIOLOGY REPORT*  Clinical Data: Headache with dizziness and chest pain.  CT HEAD WITHOUT CONTRAST  Technique:  Contiguous axial images were obtained from the base of the skull through the vertex without contrast.  Comparison: 05/18/2010 most recent.  Findings: There is no evidence for acute infarction, intracranial hemorrhage, mass lesion, hydrocephalus, or extra-axial fluid. There is no atrophy or white matter disease.  Calvarium is intact. Sinuses and mastoids are clear.  No change from priors.  IMPRESSION: Negative exam.   Original Report Authenticated  By: Elsie Stain, M.D.    Mr Brain Wo Contrast  10/07/2011  *RADIOLOGY REPORT*  Clinical Data: 41 year old male with dizziness, vertigo, intermittent chest pain.  MRI HEAD WITHOUT CONTRAST  Technique:  Multiplanar, multiecho pulse sequences of the brain and surrounding structures were obtained according to standard protocol without intravenous contrast.  Comparison: Head CT without contrast 10/07/2011 and earlier.  Brain MRI 06/01/2003.  Findings: No restricted diffusion to suggest acute infarction.  No midline shift, mass effect, evidence of mass lesion, ventriculomegaly, extra-axial collection or acute intracranial hemorrhage.  Cervicomedullary junction and pituitary are within normal limits.  Major intracranial vascular flow voids are stable. Normal cerebral volume.  Wallace Cullens and white matter signal is within normal limits for age throughout the brain.  Visualized internal auditory structures appear stable and grossly normal.  Mastoids are stable/clear. Negative visualized cervical spine.  Normal marrow signal. Visualized orbit soft tissues are within normal limits.  Paranasal sinuses are clear.  Negative scalp soft tissues.  IMPRESSION: Normal for age noncontrast MRI appearance of the brain.   Original Report Authenticated By: Harley Hallmark, M.D.    Dg Chest Portable 1 View  10/07/2011  *RADIOLOGY REPORT*  Clinical Data: Left-sided chest pain  PORTABLE CHEST - 1 VIEW  Comparison: 08/23/2010  Findings: Lungs are clear without infiltrate or effusion.  Negative for heart failure or mass.  IMPRESSION: No acute abnormality.   Original Report Authenticated By: Camelia Phenes, M.D.       MDM  I have reviewed old records.  Cardiac Catheterization Procedure Note  Name: [name]  MRN: [id number]  DOB: Feb 13, 1970  Procedure: Left Heart Cath, Selective Coronary Angiography, LV angiography  Indication: A 41 year old gentleman with strong family history of premature coronary artery disease has presented  with progressive chest pain. He was referred for cardiac catheterization because of high pretest probability of obstructive CAD and concerns for unstable angina.  Procedural details: The right groin was prepped, draped, and anesthetized with 1% lidocaine. Using modified Seldinger technique, a 5 French sheath was introduced into the right femoral artery. Standard Judkins catheters were used for coronary angiography and left ventriculography. Catheter exchanges were performed over a guidewire. There were no immediate procedural complications. The patient was transferred to the post catheterization recovery area for further monitoring.  Procedural Findings:  Hemodynamics:  AO 93/58  LV 100/7  Coronary angiography:  Coronary dominance: right  Left mainstem: Widely patent with no obstructive disease.  Left anterior descending (LAD): The LAD courses to the left ventricular apex. The vessel is widely patent. It supplies a tiny first diagonal and a large second diagonal branch there is no obstructive disease throughout.  Left circumflex (LCx): The circumflex is a large vessel with no obstructive disease. There is a large first obtuse marginal branch that divides into twin vessels. The AV groove circumflex extends to provide a posterolateral branch distally.  Right coronary artery (RCA): The RCA is dominant. The vessel is smooth throughout its course. There is no obstructive disease visualized. There is an acute marginal branch and a PDA branch supplied.  Left ventriculography: Left ventricular systolic function is normal, LVEF is estimated at 55-65%, there is no significant mitral regurgitation  Final Conclusions:  1. No obstructive coronary artery disease with widely patent coronary arteries  2. Normal left ventricular function  Recommendations: Suspect noncardiac chest pain. The patient has no apparent cardiac disease.  Tonny Bollman  02/21/2011, 8:15 AM    Pt has had a recent cath.  No sign of  cardiac disease.  Doubt SAH other complication associated with his BP earlier.   At this time there does not appear to be any evidence of an acute emergency medical condition and the patient appears stable for discharge with appropriate outpatient follow up.   Pt's dc was held because he felt dizzy and had trouble walking.  BP was normal.  MRI done to rule out stroke.  No sign of stroke.  May be related to vertigo.  Will dc at this time.  Celene Kras, MD 10/07/11 [phone]

## 2011-10-07 NOTE — ED Notes (Signed)
Patient states chest pain in left chest today starting at 1300, patient states intermittant stabbing pain, non-radiating, patient also reports dizziness with  Pain.  Patient received ASA 324 mg, Nitro x 1 per EMS with nitro relieving chest pain, patient states no pain at this time, patient does c/o headache that started today earlier

## 2012-02-05 ENCOUNTER — Emergency Department: Payer: Self-pay | Admitting: Emergency Medicine

## 2012-02-25 ENCOUNTER — Other Ambulatory Visit (HOSPITAL_COMMUNITY): Payer: Self-pay | Admitting: Family Medicine

## 2012-02-25 ENCOUNTER — Ambulatory Visit (HOSPITAL_COMMUNITY)
Admission: RE | Admit: 2012-02-25 | Discharge: 2012-02-25 | Disposition: A | Discharge status: Home / Self Care | Payer: BC Managed Care – PPO | Source: Ambulatory Visit | Attending: Family Medicine | Admitting: Family Medicine

## 2012-02-25 DIAGNOSIS — S6990XA Unspecified injury of unspecified wrist, hand and finger(s), initial encounter: Secondary | ICD-10-CM | POA: Insufficient documentation

## 2012-02-25 DIAGNOSIS — M79609 Pain in unspecified limb: Secondary | ICD-10-CM | POA: Insufficient documentation

## 2012-02-25 DIAGNOSIS — X58XXXA Exposure to other specified factors, initial encounter: Secondary | ICD-10-CM | POA: Insufficient documentation

## 2012-02-25 DIAGNOSIS — T148XXA Other injury of unspecified body region, initial encounter: Secondary | ICD-10-CM

## 2012-03-07 ENCOUNTER — Emergency Department (HOSPITAL_COMMUNITY)
Admission: EM | Admit: 2012-03-07 | Discharge: 2012-03-07 | Disposition: A | Discharge status: Home / Self Care | Payer: BC Managed Care – PPO | Attending: Emergency Medicine | Admitting: Emergency Medicine

## 2012-03-07 ENCOUNTER — Emergency Department (HOSPITAL_COMMUNITY): Payer: BC Managed Care – PPO

## 2012-03-07 ENCOUNTER — Encounter (HOSPITAL_COMMUNITY): Payer: Self-pay | Admitting: Cardiology

## 2012-03-07 DIAGNOSIS — Z8679 Personal history of other diseases of the circulatory system: Secondary | ICD-10-CM | POA: Insufficient documentation

## 2012-03-07 DIAGNOSIS — R059 Cough, unspecified: Secondary | ICD-10-CM | POA: Insufficient documentation

## 2012-03-07 DIAGNOSIS — R1084 Generalized abdominal pain: Secondary | ICD-10-CM | POA: Insufficient documentation

## 2012-03-07 DIAGNOSIS — G4733 Obstructive sleep apnea (adult) (pediatric): Secondary | ICD-10-CM | POA: Insufficient documentation

## 2012-03-07 DIAGNOSIS — K219 Gastro-esophageal reflux disease without esophagitis: Secondary | ICD-10-CM | POA: Insufficient documentation

## 2012-03-07 DIAGNOSIS — J029 Acute pharyngitis, unspecified: Secondary | ICD-10-CM | POA: Insufficient documentation

## 2012-03-07 DIAGNOSIS — Z7982 Long term (current) use of aspirin: Secondary | ICD-10-CM | POA: Insufficient documentation

## 2012-03-07 DIAGNOSIS — Z79899 Other long term (current) drug therapy: Secondary | ICD-10-CM | POA: Insufficient documentation

## 2012-03-07 DIAGNOSIS — Z8639 Personal history of other endocrine, nutritional and metabolic disease: Secondary | ICD-10-CM | POA: Insufficient documentation

## 2012-03-07 DIAGNOSIS — I10 Essential (primary) hypertension: Secondary | ICD-10-CM | POA: Insufficient documentation

## 2012-03-07 DIAGNOSIS — R197 Diarrhea, unspecified: Secondary | ICD-10-CM | POA: Insufficient documentation

## 2012-03-07 DIAGNOSIS — R05 Cough: Secondary | ICD-10-CM | POA: Insufficient documentation

## 2012-03-07 DIAGNOSIS — F17200 Nicotine dependence, unspecified, uncomplicated: Secondary | ICD-10-CM | POA: Insufficient documentation

## 2012-03-07 DIAGNOSIS — Z862 Personal history of diseases of the blood and blood-forming organs and certain disorders involving the immune mechanism: Secondary | ICD-10-CM | POA: Insufficient documentation

## 2012-03-07 DIAGNOSIS — R112 Nausea with vomiting, unspecified: Secondary | ICD-10-CM

## 2012-03-07 DIAGNOSIS — I509 Heart failure, unspecified: Secondary | ICD-10-CM | POA: Insufficient documentation

## 2012-03-07 DIAGNOSIS — Z8659 Personal history of other mental and behavioral disorders: Secondary | ICD-10-CM | POA: Insufficient documentation

## 2012-03-07 LAB — CBC
HCT: 48.2 % (ref 39.0–52.0)
Hemoglobin: 17.2 g/dL — ABNORMAL HIGH (ref 13.0–17.0)
MCV: 89.8 fL (ref 78.0–100.0)
Platelets: 226 10*3/uL (ref 150–400)
RBC: 5.37 MIL/uL (ref 4.22–5.81)
WBC: 11.8 10*3/uL — ABNORMAL HIGH (ref 4.0–10.5)

## 2012-03-07 LAB — COMPREHENSIVE METABOLIC PANEL
AST: 16 U/L (ref 0–37)
Albumin: 3.7 g/dL (ref 3.5–5.2)
BUN: 12 mg/dL (ref 6–23)
Calcium: 9.2 mg/dL (ref 8.4–10.5)
Creatinine, Ser: 0.98 mg/dL (ref 0.50–1.35)
Total Bilirubin: 0.5 mg/dL (ref 0.3–1.2)
Total Protein: 7.4 g/dL (ref 6.0–8.3)

## 2012-03-07 LAB — URINALYSIS, ROUTINE W REFLEX MICROSCOPIC
Bilirubin Urine: NEGATIVE
Glucose, UA: NEGATIVE mg/dL
Nitrite: NEGATIVE
Specific Gravity, Urine: 1.035 — ABNORMAL HIGH (ref 1.005–1.030)
pH: 5.5 (ref 5.0–8.0)

## 2012-03-07 LAB — URINE MICROSCOPIC-ADD ON

## 2012-03-07 MED ORDER — PANTOPRAZOLE SODIUM 40 MG IV SOLR
40.0000 mg | Freq: Once | INTRAVENOUS | Status: AC
  Administered 2012-03-07: 40 mg via INTRAVENOUS
  Filled 2012-03-07: qty 40

## 2012-03-07 MED ORDER — ONDANSETRON HCL 8 MG PO TABS: 8.0000 mg | ORAL_TABLET | Freq: Three times a day (TID) | ORAL | Status: DC | PRN

## 2012-03-07 MED ORDER — HYDROMORPHONE HCL PF 1 MG/ML IJ SOLN
1.0000 mg | Freq: Once | INTRAMUSCULAR | Status: AC
  Administered 2012-03-07: 1 mg via INTRAVENOUS
  Filled 2012-03-07: qty 1

## 2012-03-07 MED ORDER — SODIUM CHLORIDE 0.9 % IV BOLUS (SEPSIS)
1000.0000 mL | Freq: Once | INTRAVENOUS | Status: AC
  Administered 2012-03-07: 1000 mL via INTRAVENOUS

## 2012-03-07 MED ORDER — ONDANSETRON HCL 4 MG/2ML IJ SOLN
4.0000 mg | Freq: Once | INTRAMUSCULAR | Status: AC
  Administered 2012-03-07: 4 mg via INTRAVENOUS
  Filled 2012-03-07: qty 2

## 2012-03-07 NOTE — ED Notes (Signed)
Pt reports about 0200 this morning he started having abd pain with n/v. States he feels like he has the flu. Both parents have been sick recently. Pt also reports generalized weakness.

## 2012-03-07 NOTE — ED Provider Notes (Signed)
History     CSN: [id number]  Arrival date & time 03/07/12  1611   First MD Initiated Contact with Patient 03/07/12 1833      Chief Complaint  Patient presents with  . Cough  . Nausea    (Consider location/radiation/quality/duration/timing/severity/associated sxs/prior treatment) The history is provided by the patient.  pt c/o nvd, and abd cramping since early this morning. Has had several episodes of both vomiting and diarrhea. Emesis clear/yellowish, no bloody or bilious emesis. Diarrhea watery, not bloody. Diffuse abd cramping comes and goes, no focal/constant abd pain. No fever or chills. No cough or uri c/o. Parents w recent uri symptoms. Pt denies gu c/o. No prior abd surgery. No abd distension. No known ill contacts w gi symptoms, no known bad food ingestion, no recent abx use.      Past Medical History  Diagnosis Date  . Hypertension     Lab 02/2011: Normal CBC and BMet, TSH-1.6  . History of PSVT (paroxysmal supraventricular tachycardia)     s/p ablation 2006; occasional palpitations; occasional atypical left chest pain; event recorder in 03/2011-normal sinus, sinus tachycardia, single symptomatic spell with sinus rhythm  . Hyperlipidemia     Intolerant to statins causing tongue swelling  . Obstructive sleep apnea     Wears CPAP intermittantly. Followed by Dr. Gildardo Cranker [facility].  . Hyperlipidemia     Lipid profile in 2008:225, 770, 27,?; statin allergy-oral edema  . Gastroesophageal reflux disease   . Anxiety and depression     2009-suicidal ideation, involuntary commitment  . Congestive heart failure ?    Past Surgical History  Procedure Laterality Date  . Nasal turbinate reduction    . Shoulder surgery      Right  . Ankle surgery      Right    Family History  Problem Relation Age of Onset  . Heart disease Father   . Heart attack Father   . Heart disease Paternal Grandfather     History  Substance Use Topics  . Smoking status: Current Every  Day Smoker -- 1.00 packs/day for 23 years    Types: Cigarettes  . Smokeless tobacco: Never Used     Comment: down to 3 a day  . Alcohol Use: No      Review of Systems  Constitutional: Negative for fever.  HENT: Negative for sore throat and neck pain.   Eyes: Negative for redness.  Respiratory: Negative for shortness of breath.   Cardiovascular: Negative for chest pain.  Gastrointestinal: Positive for vomiting and diarrhea. Negative for abdominal pain.  Genitourinary: Negative for dysuria and flank pain.  Musculoskeletal: Negative for back pain.  Skin: Negative for rash.  Neurological: Negative for headaches.  Hematological: Does not bruise/bleed easily.  Psychiatric/Behavioral: Negative for confusion.    Allergies  Fenofibrate and Simvastatin  Home Medications   Current Outpatient Rx  Name  Route  Sig  Dispense  Refill  . aspirin 325 MG tablet   Oral   Take 325 mg by mouth daily.         Marland Kitchen esomeprazole (NEXIUM) 40 MG capsule   Oral   Take 40 mg by mouth daily before breakfast.           . meclizine (ANTIVERT) 25 MG tablet   Oral   Take 1 tablet (25 mg total) by mouth 3 (three) times daily as needed.   30 tablet   0   . metoprolol (LOPRESSOR) 50 MG tablet   Oral   Take  50 mg by mouth 2 (two) times daily.         . nitroGLYCERIN (NITROSTAT) 0.4 MG SL tablet   Sublingual   Place 1 tablet (0.4 mg total) under the tongue every 5 (five) minutes as needed.   25 tablet   3     BP 134/89  Pulse 104  Temp(Src) 97.9 F (36.6 C) (Oral)  SpO2 97%  Physical Exam  Nursing note and vitals reviewed. Constitutional: He is oriented to person, place, and time. He appears well-developed and well-nourished. No distress.  HENT:  Head: Atraumatic.  Mouth/Throat: Oropharynx is clear and moist.  Eyes: Conjunctivae are normal. No scleral icterus.  Neck: Neck supple. No tracheal deviation present.  Cardiovascular: Normal rate, regular rhythm, normal heart sounds and  intact distal pulses.   Pulmonary/Chest: Effort normal and breath sounds normal. No accessory muscle usage. No respiratory distress.  Abdominal: Soft. Bowel sounds are normal. He exhibits no distension and no mass. There is tenderness. There is no rebound and no guarding.  Mild diffuse tenderness, no rebound or guarding.   Genitourinary:  No cva tenderness  Musculoskeletal: Normal range of motion. He exhibits no edema and no tenderness.  Neurological: He is alert and oriented to person, place, and time.  Skin: Skin is warm and dry.  Psychiatric: He has a normal mood and affect.    ED Course  Procedures (including critical care time)  Results for orders placed during the hospital encounter of 03/07/12  COMPREHENSIVE METABOLIC PANEL      Result Value Range   Sodium 134 (*) 135 - 145 mEq/L   Potassium 3.7  3.5 - 5.1 mEq/L   Chloride 100  96 - 112 mEq/L   CO2 24  19 - 32 mEq/L   Glucose, Bld 120 (*) 70 - 99 mg/dL   BUN 12  6 - 23 mg/dL   Creatinine, Ser 4.09  0.50 - 1.35 mg/dL   Calcium 9.2  8.4 - 81.1 mg/dL   Total Protein 7.4  6.0 - 8.3 g/dL   Albumin 3.7  3.5 - 5.2 g/dL   AST 16  0 - 37 U/L   ALT 25  0 - 53 U/L   Alkaline Phosphatase 108  39 - 117 U/L   Total Bilirubin 0.5  0.3 - 1.2 mg/dL   GFR calc non Af Amer >90  >90 mL/min   GFR calc Af Amer >90  >90 mL/min  CBC      Result Value Range   WBC 11.8 (*) 4.0 - 10.5 K/uL   RBC 5.37  4.22 - 5.81 MIL/uL   Hemoglobin 17.2 (*) 13.0 - 17.0 g/dL   HCT 91.4  78.2 - 95.6 %   MCV 89.8  78.0 - 100.0 fL   MCH 32.0  26.0 - 34.0 pg   MCHC 35.7  30.0 - 36.0 g/dL   RDW 21.3  08.6 - 57.8 %   Platelets 226  150 - 400 K/uL  URINALYSIS, ROUTINE W REFLEX MICROSCOPIC      Result Value Range   Color, Urine AMBER (*) YELLOW   APPearance CLOUDY (*) CLEAR   Specific Gravity, Urine 1.035 (*) 1.005 - 1.030   pH 5.5  5.0 - 8.0   Glucose, UA NEGATIVE  NEGATIVE mg/dL   Hgb urine dipstick NEGATIVE  NEGATIVE   Bilirubin Urine NEGATIVE  NEGATIVE    Ketones, ur NEGATIVE  NEGATIVE mg/dL   Protein, ur 30 (*) NEGATIVE mg/dL   Urobilinogen, UA 1.0  0.0 - 1.0 mg/dL   Nitrite NEGATIVE  NEGATIVE   Leukocytes, UA NEGATIVE  NEGATIVE  LIPASE, BLOOD      Result Value Range   Lipase 21  11 - 59 U/L  URINE MICROSCOPIC-ADD ON      Result Value Range   WBC, UA 0-2  <3 WBC/hpf   Urine-Other MUCOUS PRESENT     Dg Chest 2 View  03/07/2012  *RADIOLOGY REPORT*  Clinical Data: Cough and fever.  CHEST - 2 VIEW  Comparison: 10/07/2011 and prior chest radiographs  Findings: The cardiomediastinal silhouette is unremarkable. Mild peribronchial thickening is stable. There is no evidence of focal airspace disease, pulmonary edema, suspicious pulmonary nodule/mass, pleural effusion, or pneumothorax. No acute bony abnormalities are identified. Remote left rib fractures are again identified.  IMPRESSION: No evidence of acute cardiopulmonary disease.   Original Report Authenticated By: Harmon Pier, M.D.    Dg Finger Middle Left  02/25/2012  *RADIOLOGY REPORT*  Clinical Data: Pain and swelling at PIP joint, horse stomped on left middle finger  LEFT MIDDLE FINGER 2+V  Comparison: None  Findings: Soft tissue swelling left middle finger greatest at PIP joint. Osseous mineralization normal. Joint spaces preserved. No definite fracture, dislocation, or bone destruction.  IMPRESSION: No acute osseous abnormalities.   Original Report Authenticated By: Ulyses Southward, M.D.        MDM  Iv ns bolus. zofran iv. protonix iv. Dilaudid 1 mg iv.  Labs.  Reviewed nursing notes and prior charts for additional history.   Recheck feels much improved. abd soft nt. No recurrent nvd.   Pt appears stable for d/c.   To return for recheck in 1 days time if symptoms fail to improve/resolve.       Suzi Roots, MD 03/07/12 [phone]

## 2012-06-03 ENCOUNTER — Emergency Department (HOSPITAL_COMMUNITY)
Admission: EM | Admit: 2012-06-03 | Discharge: 2012-06-03 | Disposition: A | Discharge status: Home / Self Care | Payer: Worker's Compensation | Attending: Emergency Medicine | Admitting: Emergency Medicine

## 2012-06-03 ENCOUNTER — Encounter (HOSPITAL_COMMUNITY): Payer: Self-pay | Admitting: Emergency Medicine

## 2012-06-03 ENCOUNTER — Emergency Department (HOSPITAL_COMMUNITY): Payer: Worker's Compensation

## 2012-06-03 DIAGNOSIS — F17200 Nicotine dependence, unspecified, uncomplicated: Secondary | ICD-10-CM | POA: Insufficient documentation

## 2012-06-03 DIAGNOSIS — Y9389 Activity, other specified: Secondary | ICD-10-CM | POA: Insufficient documentation

## 2012-06-03 DIAGNOSIS — Z8659 Personal history of other mental and behavioral disorders: Secondary | ICD-10-CM | POA: Insufficient documentation

## 2012-06-03 DIAGNOSIS — Z862 Personal history of diseases of the blood and blood-forming organs and certain disorders involving the immune mechanism: Secondary | ICD-10-CM | POA: Insufficient documentation

## 2012-06-03 DIAGNOSIS — Z9981 Dependence on supplemental oxygen: Secondary | ICD-10-CM | POA: Insufficient documentation

## 2012-06-03 DIAGNOSIS — I509 Heart failure, unspecified: Secondary | ICD-10-CM | POA: Insufficient documentation

## 2012-06-03 DIAGNOSIS — K219 Gastro-esophageal reflux disease without esophagitis: Secondary | ICD-10-CM | POA: Insufficient documentation

## 2012-06-03 DIAGNOSIS — R209 Unspecified disturbances of skin sensation: Secondary | ICD-10-CM | POA: Insufficient documentation

## 2012-06-03 DIAGNOSIS — S300XXA Contusion of lower back and pelvis, initial encounter: Secondary | ICD-10-CM

## 2012-06-03 DIAGNOSIS — Z8639 Personal history of other endocrine, nutritional and metabolic disease: Secondary | ICD-10-CM | POA: Insufficient documentation

## 2012-06-03 DIAGNOSIS — W1809XA Striking against other object with subsequent fall, initial encounter: Secondary | ICD-10-CM | POA: Insufficient documentation

## 2012-06-03 DIAGNOSIS — F341 Dysthymic disorder: Secondary | ICD-10-CM | POA: Insufficient documentation

## 2012-06-03 DIAGNOSIS — S20229A Contusion of unspecified back wall of thorax, initial encounter: Secondary | ICD-10-CM | POA: Insufficient documentation

## 2012-06-03 DIAGNOSIS — M545 Low back pain: Secondary | ICD-10-CM

## 2012-06-03 DIAGNOSIS — G4733 Obstructive sleep apnea (adult) (pediatric): Secondary | ICD-10-CM | POA: Insufficient documentation

## 2012-06-03 DIAGNOSIS — W1789XA Other fall from one level to another, initial encounter: Secondary | ICD-10-CM | POA: Insufficient documentation

## 2012-06-03 DIAGNOSIS — I10 Essential (primary) hypertension: Secondary | ICD-10-CM | POA: Insufficient documentation

## 2012-06-03 DIAGNOSIS — Z8679 Personal history of other diseases of the circulatory system: Secondary | ICD-10-CM | POA: Insufficient documentation

## 2012-06-03 DIAGNOSIS — Z79899 Other long term (current) drug therapy: Secondary | ICD-10-CM | POA: Insufficient documentation

## 2012-06-03 DIAGNOSIS — Y9289 Other specified places as the place of occurrence of the external cause: Secondary | ICD-10-CM | POA: Insufficient documentation

## 2012-06-03 MED ORDER — PREDNISONE 50 MG PO TABS
60.0000 mg | ORAL_TABLET | Freq: Once | ORAL | Status: AC
  Administered 2012-06-03: 60 mg via ORAL
  Filled 2012-06-03: qty 1

## 2012-06-03 MED ORDER — PREDNISONE 10 MG PO TABS: 20.0000 mg | ORAL_TABLET | Freq: Two times a day (BID) | ORAL | Status: DC

## 2012-06-03 MED ORDER — OXYCODONE-ACETAMINOPHEN 5-325 MG PO TABS: 1.0000 | ORAL_TABLET | ORAL | Status: DC | PRN

## 2012-06-03 MED ORDER — CYCLOBENZAPRINE HCL 10 MG PO TABS
10.0000 mg | ORAL_TABLET | Freq: Once | ORAL | Status: AC
  Administered 2012-06-03: 10 mg via ORAL
  Filled 2012-06-03: qty 1

## 2012-06-03 MED ORDER — CYCLOBENZAPRINE HCL 10 MG PO TABS: 10.0000 mg | ORAL_TABLET | Freq: Two times a day (BID) | ORAL | Status: DC | PRN

## 2012-06-03 MED ORDER — OXYCODONE-ACETAMINOPHEN 5-325 MG PO TABS
1.0000 | ORAL_TABLET | Freq: Once | ORAL | Status: AC
  Administered 2012-06-03: 1 via ORAL
  Filled 2012-06-03: qty 1

## 2012-06-03 NOTE — ED Notes (Signed)
Urine drug screen collected before pain meds given.

## 2012-06-03 NOTE — ED Notes (Signed)
Pt states he slipped getting off the bulldozer at work and since then c/o tailbone pain and bilateral leg tingling.

## 2012-06-03 NOTE — ED Provider Notes (Signed)
History     CSN: [id number]  Arrival date & time 06/03/12  1939   First MD Initiated Contact with Patient 06/03/12 2016      Chief Complaint  Patient presents with  . Fall  . Tailbone Pain    (Consider location/radiation/quality/duration/timing/severity/associated sxs/prior treatment) Patient is a 42 y.o. male presenting with fall. The history is provided by the patient.  Fall The accident occurred 1 to 2 hours ago. Fall occurred: while getting off a bulldozer. He fell from a height of 1 to 2 ft. Impact surface: metal. Point of impact: buttocks. Pain location: low back. The pain is at a severity of 9/10. The pain is severe. He was ambulatory at the scene. There was no entrapment after the fall. There was no drug use involved in the accident. There was no alcohol use involved in the accident. Associated symptoms include numbness and tingling. Pertinent negatives include no visual change, no fever, no abdominal pain, no bowel incontinence, no nausea, no vomiting, no hematuria, no headaches and no loss of consciousness. The symptoms are aggravated by activity, standing, ambulation and pressure on the injury. He has tried nothing for the symptoms.   [name] is a 42 y.o. male who presents to the ED with coccyx pain. The pain started suddenly when he was getting off a bulldozer and slipped and fell on his buttocks. He complains of pain in his lower back and coccyx. The pain radiates to his legs and he has numbness and tingling.   Past Medical History  Diagnosis Date  . Hypertension     Lab 02/2011: Normal CBC and BMet, TSH-1.6  . History of PSVT (paroxysmal supraventricular tachycardia)     s/p ablation 2006; occasional palpitations; occasional atypical left chest pain; event recorder in 03/2011-normal sinus, sinus tachycardia, single symptomatic spell with sinus rhythm  . Hyperlipidemia     Intolerant to statins causing tongue swelling  . Obstructive sleep apnea     Wears CPAP  intermittantly. Followed by Dr. Gildardo Cranker Pulmonology.  . Hyperlipidemia     Lipid profile in 2008:225, 770, 27,?; statin allergy-oral edema  . Gastroesophageal reflux disease   . Anxiety and depression     2009-suicidal ideation, involuntary commitment  . Congestive heart failure ?    Past Surgical History  Procedure Laterality Date  . Nasal turbinate reduction    . Shoulder surgery      Right  . Ankle surgery      Right    Family History  Problem Relation Age of Onset  . Heart disease Father   . Heart attack Father   . Heart disease Paternal Grandfather     History  Substance Use Topics  . Smoking status: Current Every Day Smoker -- 1.00 packs/day for 23 years    Types: Cigarettes  . Smokeless tobacco: Never Used     Comment: down to 3 a day  . Alcohol Use: No      Review of Systems  Constitutional: Negative for fever and chills.  HENT: Negative for neck pain.   Eyes: Negative for visual disturbance.  Respiratory: Negative for shortness of breath.   Cardiovascular: Negative for chest pain.  Gastrointestinal: Negative for nausea, vomiting, abdominal pain and bowel incontinence.  Genitourinary: Negative for hematuria.  Musculoskeletal: Positive for back pain. Gait problem: due to pain.  Skin: Negative for wound.  Neurological: Positive for tingling and numbness. Negative for loss of consciousness, light-headedness and headaches.  Psychiatric/Behavioral: Negative for confusion. The patient is  not nervous/anxious.     Allergies  Simvastatin  Home Medications   Current Outpatient Rx  Name  Route  Sig  Dispense  Refill  . esomeprazole (NEXIUM) 40 MG capsule   Oral   Take 40 mg by mouth daily before breakfast.           . lamoTRIgine (LAMICTAL) 100 MG tablet   Oral   Take 100 mg by mouth daily.         . metoprolol (LOPRESSOR) 50 MG tablet   Oral   Take 100 mg by mouth every morning.          . nitroGLYCERIN (NITROSTAT) 0.4 MG SL tablet    Sublingual   Place 0.4 mg under the tongue every 5 (five) minutes as needed. x3 doses as needed for chest pain           BP 136/91  Pulse 83  Temp(Src) 98.6 F (37 C) (Oral)  Resp 20  Ht 6' (1.829 m)  Wt 225 lb (102.059 kg)  BMI 30.51 kg/m2  SpO2 96%  Physical Exam  Nursing note and vitals reviewed. Constitutional: He is oriented to person, place, and time. He appears well-developed and well-nourished. No distress.  HENT:  Head: Normocephalic and atraumatic.  Eyes: EOM are normal.  Neck: Neck supple.  Cardiovascular: Normal rate.   Pulmonary/Chest: Effort normal.  Abdominal: Soft. There is no tenderness.  Musculoskeletal:       Lumbar back: He exhibits decreased range of motion, tenderness and spasm. He exhibits no laceration.       Back:  Neurological: He is alert and oriented to person, place, and time. He has normal strength and normal reflexes. No cranial nerve deficit or sensory deficit.  Pedal pulses strong and equal bilateral, good touch sensation, adequate circulation. Patient is able to ambulate in exam room without foot drag. He does exhibit pain with ambulation. There is muscle spasm noted in the left lowe lumbar area.   Skin: Skin is warm and dry.  Psychiatric: He has a normal mood and affect. His behavior is normal. Judgment and thought content normal.    ED Course  Procedures (including critical care time)  Labs Reviewed  URINE RAPID DRUG SCREEN (HOSP PERFORMED)   Dg Sacrum/coccyx  06/03/2012   *RADIOLOGY REPORT*  Clinical Data: Fall from bulldozer.  Pain and tail bone.  SACRUM AND COCCYX - 2+ VIEW  Comparison: CT abdomen and pelvis 03/25/2009.  Findings: The sacrum is intact.  No acute bone or soft tissue abnormality is present.  There is no significant interval change.  IMPRESSION: Negative sacrum radiographs.   Original Report Authenticated By: Marin Roberts, M.D.   Assessment: 42 y.o. male with lower back pain s/p fall  Plan:  Pain management,  muscle relaxant, follow up with PCP  MDM  Reviewed patient's x-ray results, discussed with the patient that the x-ray shows no fracture, however, he could have a herniated disc and will need follow up. I discussed plan of care and patient voices understanding. He will be out of work until follow up with his PCP. He is stable for discharge home without any immediate complications. I have discussed this case with Dr. Juleen China and he agrees with plan of care.   Medication List    TAKE these medications       cyclobenzaprine 10 MG tablet  Commonly known as:  FLEXERIL  Take 1 tablet (10 mg total) by mouth 2 (two) times daily as needed for muscle spasms.  oxyCODONE-acetaminophen 5-325 MG per tablet  Commonly known as:  PERCOCET/ROXICET  Take 1 tablet by mouth every 4 (four) hours as needed for pain.     predniSONE 10 MG tablet  Commonly known as:  DELTASONE  Take 2 tablets (20 mg total) by mouth 2 (two) times daily.      ASK your doctor about these medications       esomeprazole 40 MG capsule  Commonly known as:  NEXIUM  Take 40 mg by mouth daily before breakfast.     lamoTRIgine 100 MG tablet  Commonly known as:  LAMICTAL  Take 100 mg by mouth daily.     metoprolol 50 MG tablet  Commonly known as:  LOPRESSOR  Take 100 mg by mouth every morning.     nitroGLYCERIN 0.4 MG SL tablet  Commonly known as:  NITROSTAT  Place 0.4 mg under the tongue every 5 (five) minutes as needed. x3 doses as needed for chest pain               Janne Napoleon, NP 06/03/12 2346

## 2012-06-04 NOTE — ED Provider Notes (Signed)
Medical screening examination/treatment/procedure(s) were performed by non-physician practitioner and as supervising physician I was immediately available for consultation/collaboration.  Raeford Razor, MD 06/04/12 [phone]

## 2012-06-08 ENCOUNTER — Ambulatory Visit (HOSPITAL_COMMUNITY)
Admission: RE | Admit: 2012-06-08 | Discharge: 2012-06-08 | Disposition: A | Discharge status: Home / Self Care | Payer: BC Managed Care – PPO | Source: Ambulatory Visit | Attending: Family Medicine | Admitting: Family Medicine

## 2012-06-08 ENCOUNTER — Other Ambulatory Visit (HOSPITAL_COMMUNITY): Payer: Self-pay | Admitting: Family Medicine

## 2012-06-08 DIAGNOSIS — T148XXA Other injury of unspecified body region, initial encounter: Secondary | ICD-10-CM

## 2012-06-08 DIAGNOSIS — IMO0002 Reserved for concepts with insufficient information to code with codable children: Secondary | ICD-10-CM | POA: Insufficient documentation

## 2012-06-08 DIAGNOSIS — M545 Low back pain, unspecified: Secondary | ICD-10-CM | POA: Insufficient documentation

## 2012-06-08 DIAGNOSIS — W19XXXA Unspecified fall, initial encounter: Secondary | ICD-10-CM | POA: Insufficient documentation

## 2012-06-10 ENCOUNTER — Other Ambulatory Visit (HOSPITAL_COMMUNITY): Payer: Self-pay | Admitting: Family Medicine

## 2012-06-10 DIAGNOSIS — T148XXA Other injury of unspecified body region, initial encounter: Secondary | ICD-10-CM

## 2012-06-16 ENCOUNTER — Ambulatory Visit (HOSPITAL_COMMUNITY)
Admission: RE | Admit: 2012-06-16 | Discharge: 2012-06-16 | Disposition: A | Discharge status: Home / Self Care | Payer: Worker's Compensation | Source: Ambulatory Visit | Attending: Family Medicine | Admitting: Family Medicine

## 2012-06-16 DIAGNOSIS — S3981XA Other specified injuries of abdomen, initial encounter: Secondary | ICD-10-CM | POA: Insufficient documentation

## 2012-06-16 DIAGNOSIS — R109 Unspecified abdominal pain: Secondary | ICD-10-CM | POA: Insufficient documentation

## 2012-06-16 DIAGNOSIS — M545 Low back pain, unspecified: Secondary | ICD-10-CM | POA: Insufficient documentation

## 2012-06-16 DIAGNOSIS — Y9269 Other specified industrial and construction area as the place of occurrence of the external cause: Secondary | ICD-10-CM | POA: Insufficient documentation

## 2012-06-16 DIAGNOSIS — T148XXA Other injury of unspecified body region, initial encounter: Secondary | ICD-10-CM

## 2012-06-16 DIAGNOSIS — W010XXA Fall on same level from slipping, tripping and stumbling without subsequent striking against object, initial encounter: Secondary | ICD-10-CM | POA: Insufficient documentation

## 2012-09-22 ENCOUNTER — Telehealth: Payer: Self-pay | Admitting: Orthopedic Surgery

## 2012-09-22 NOTE — Telephone Encounter (Signed)
Patient called today, 09/22/12, requesting appointment for as soon as possible for problem of Right foot and ankle pain+feeling like it has "turned over", and that "can barely walk right", states had surgery on this foot by Dr. Romeo Apple several years ago, possibly 18 years, when he was in the office with Dr. Hilda Lias.  States he's had no other treatment since that time on this foot or ankle. He said "Dr. Romeo Apple will remember me."    - I relayed that Dr. Romeo Apple will need to review.   Please find note from 2012 electronic medical records system.  Please advise.  Patient ph# is [phone]. (Patient was given the information that I located a record from 2012; he said he did not recall being here then._  *COPIED AND PASTED NOTE FROM 02/27/2010, EMR Centricity system chart note:Marland Kitchen Vital Signs: Patient profile: 42 year old male Height:    72 inches Weight:    228 pounds Pulse rate: 80 / minute Resp:  18 per minute  Vitals Entered By: Fuller Canada MD (February 27, 2010 1:49 PM)  Visit Type:  new patient Referring Provider:  self Primary Provider:  na  CC:  right knee pain.  History of Present Illness: I saw [name] in the office today for an initial visit.  He is a 42 years old man with the complaint of: RIGHT knee pain  DOI 08/31/09.  The patient gives a history of a workers compensation related injury to his RIGHT shoulder which required surgery by Dr. Ranell Patrick in [facility] back on the day listed.  He is employed at the [employer] in [place].  He describes a 1000 pound roller falling on the back of his leg and pinning his knee against a tire of a truck.  He says he reported this at the time but it was not included in the workers compensation case.  He is represented by a law firm and a hearing is scheduled soon.  He was seen once a primary care for evaluation of his RIGHT knee where he is complaining of posterior popliteal space numbness  tingling and pain which radiates into his calf and down towards the lower part of his leg.  He says he feels weakness in his knee by the end of the day and his knee will swell and the back as well.  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  He says that he does not have any back pain and the pain does not radiate past the popliteal space.  Tylenol does not help.  Note: He has pictures of his calf and knee area at the time of injury and he says that it was reported.  He has a history of heartburn otherwise review of systems are negative except for the tingling described.  He is currently on Nexium  He status post RIGHT ankle reconstruction for instability after another injury approximately 10-12 years ago  He is currently divorced.  He smokes a pack of cigarettes a day does not drink does not use any street drugs and he completed his education through the 10th grade  Allergies:  1)  ! * Tricor 2)  ! 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 Cardiovascular:  Denies chest pain, palpitations, fainting, and murmurs. Respiratory:  Denies short of breath, wheezing, couch, tightness, pain on inspiration, and snoring . Gastrointestinal:  Complains of  heartburn; denies nausea, vomiting, diarrhea, constipation, and blood in your stools. Genitourinary:  Denies frequency, urgency, difficulty urinating, painful urination, flank pain, and bleeding in urine. Neurologic:  Complains of tingling; denies numbness, unsteady gait, dizziness, tremors, and seizure. Musculoskeletal:  Denies joint pain, swelling, instability, stiffness, redness, heat, and muscle pain. Endocrine:  Denies excessive thirst, exessive urination, and heat or cold intolerance. Psychiatric:  Denies nervousness, depression, anxiety, and hallucinations. Skin:  Denies changes in the skin, poor healing, rash, itching, and redness. HEENT:  Denies blurred or double vision, eye pain, redness, and watering. Immunology:  Denies seasonal allergies, sinus problems, and allergic to bee stings. Hemoatologic:  Denies easy bleeding and brusing.  Physical Exam  Skin:  lateral incision noted over the RIGHT ankle and foot from previous surgery has a positive Tinel's. Psych:  alert and cooperative; normal mood and affect; normal attention span and concentration   Knee Exam  General:    Well-developed, well-nourished, normal body habitus; no deformities, normal grooming.  Gait:    Normal heel-toe gait pattern bilaterally.    Skin:    Intact, no scars, lesions, rashes, cafe au lait spots, or bruising.    Inspection:     No deformity, ecchymosis or swelling.   Palpation:    he is tender to palpation in the popliteal space.  This does not involve the medial or lateral hamstring group.  Vascular:    There was no swelling or varicose veins. The pulses and temperature are normal. There was no edema or tenderness.  Sensory:    sensory changes are noted in the foot none in the calf or lateral leg  Motor:    Motor strength 5/5 bilaterally for quadriceps, hamstrings, ankle dorsiflexion, and ankle plantar flexion.    Reflexes:    Normal and symmetric patellar and Achilles reflexes  bilaterally.    Knee Exam:    Right:    Inspection:  Normal    Palpation:  Abnormal    Stability:  stable    Tenderness:  popliteal space    he has difficulty straightening his leg when his hip is in 90 of flexion but it does not reproduce or radicular type pain just pain in the back of the calf    Range of Motion:       Flexion-Active: full    Left:    Inspection:  Normal    Palpation:  Normal    Range of Motion:       Flexion-Active: full  Anterior drawer:    Right negative Posterior drawer:    Right negative Lachman :    Right negative MCL:    Right negative LCL:    Right negative    Impression & Recommendations:  Problem # 1:  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  Recommend nerve conduction study to evaluate his tibial nerve.  Differential diagnosis would be lumbar disc disease causing leg  pain although he has an atypical presentation.  The knee is stable so I do not think there is any joint pathology  Since this is I nerve injury I recommend that he get I nerve conduction study and see a neurologist as this would not be in the scope of my practice.  I did start him on some Neurontin titrate up to 100 mg 3 times a day to help him until he can see the neurologist.  I also told by would be happy to send his records to the [facility].  Orders: New Patient Level III (02725)  Medications Added to Medication List This Visit: 1)  Neurontin 100 Mg Caps (Gabapentin) .Marland Kitchen.. 1 by mouth 1st day, 1 tablet two times a day 2nd day, one by mouth three times a day on 3rd day   Patient Instructions: 1)  No further appointment needed. 2)  fax notes to [facility] law firm  Prescriptions: NEURONTIN 100 MG CAPS (GABAPENTIN) 1 by mouth 1st day, 1 tablet two times a day 2nd day, one by mouth three times a day on 3rd day  #90 x 1  Entered and Authorized  by: Fuller Canada MD  Signed by: Fuller Canada MD on 02/27/2010  Method used: Print then Give to Patient  RxID: [id number]       Signed by Fuller Canada MD on 02/27/2010 at 2:29 PM

## 2012-09-23 NOTE — Telephone Encounter (Signed)
Sept 10th

## 2012-09-23 NOTE — Telephone Encounter (Signed)
Called back to patient, offered 09/30/12 as per Dr Mort Sawyers reply; patient states out of town all next week; therefore, scheduled for the following Wed, 10/07/12.

## 2012-10-07 ENCOUNTER — Encounter: Payer: Self-pay | Admitting: Orthopedic Surgery

## 2012-10-07 ENCOUNTER — Ambulatory Visit: Payer: Self-pay | Admitting: Orthopedic Surgery

## 2012-10-11 ENCOUNTER — Encounter (HOSPITAL_COMMUNITY): Payer: Self-pay | Admitting: *Deleted

## 2012-10-11 ENCOUNTER — Emergency Department (HOSPITAL_COMMUNITY): Payer: BC Managed Care – PPO

## 2012-10-11 ENCOUNTER — Emergency Department (HOSPITAL_COMMUNITY)
Admission: EM | Admit: 2012-10-11 | Discharge: 2012-10-11 | Disposition: A | Discharge status: Home / Self Care | Payer: BC Managed Care – PPO | Attending: Emergency Medicine | Admitting: Emergency Medicine

## 2012-10-11 DIAGNOSIS — Z8639 Personal history of other endocrine, nutritional and metabolic disease: Secondary | ICD-10-CM | POA: Insufficient documentation

## 2012-10-11 DIAGNOSIS — S61219A Laceration without foreign body of unspecified finger without damage to nail, initial encounter: Secondary | ICD-10-CM

## 2012-10-11 DIAGNOSIS — F329 Major depressive disorder, single episode, unspecified: Secondary | ICD-10-CM | POA: Insufficient documentation

## 2012-10-11 DIAGNOSIS — F411 Generalized anxiety disorder: Secondary | ICD-10-CM | POA: Insufficient documentation

## 2012-10-11 DIAGNOSIS — F17200 Nicotine dependence, unspecified, uncomplicated: Secondary | ICD-10-CM | POA: Insufficient documentation

## 2012-10-11 DIAGNOSIS — IMO0002 Reserved for concepts with insufficient information to code with codable children: Secondary | ICD-10-CM | POA: Insufficient documentation

## 2012-10-11 DIAGNOSIS — Z79899 Other long term (current) drug therapy: Secondary | ICD-10-CM | POA: Insufficient documentation

## 2012-10-11 DIAGNOSIS — I509 Heart failure, unspecified: Secondary | ICD-10-CM | POA: Insufficient documentation

## 2012-10-11 DIAGNOSIS — I10 Essential (primary) hypertension: Secondary | ICD-10-CM | POA: Insufficient documentation

## 2012-10-11 DIAGNOSIS — W268XXA Contact with other sharp object(s), not elsewhere classified, initial encounter: Secondary | ICD-10-CM | POA: Insufficient documentation

## 2012-10-11 DIAGNOSIS — Z862 Personal history of diseases of the blood and blood-forming organs and certain disorders involving the immune mechanism: Secondary | ICD-10-CM | POA: Insufficient documentation

## 2012-10-11 DIAGNOSIS — Y929 Unspecified place or not applicable: Secondary | ICD-10-CM | POA: Insufficient documentation

## 2012-10-11 DIAGNOSIS — G4733 Obstructive sleep apnea (adult) (pediatric): Secondary | ICD-10-CM | POA: Insufficient documentation

## 2012-10-11 DIAGNOSIS — F3289 Other specified depressive episodes: Secondary | ICD-10-CM | POA: Insufficient documentation

## 2012-10-11 DIAGNOSIS — S61209A Unspecified open wound of unspecified finger without damage to nail, initial encounter: Secondary | ICD-10-CM | POA: Insufficient documentation

## 2012-10-11 DIAGNOSIS — K219 Gastro-esophageal reflux disease without esophagitis: Secondary | ICD-10-CM | POA: Insufficient documentation

## 2012-10-11 DIAGNOSIS — Y9389 Activity, other specified: Secondary | ICD-10-CM | POA: Insufficient documentation

## 2012-10-11 DIAGNOSIS — Z23 Encounter for immunization: Secondary | ICD-10-CM | POA: Insufficient documentation

## 2012-10-11 MED ORDER — CEPHALEXIN 500 MG PO CAPS: 500.0000 mg | ORAL_CAPSULE | Freq: Three times a day (TID) | ORAL | Status: DC

## 2012-10-11 MED ORDER — OXYCODONE-ACETAMINOPHEN 5-325 MG PO TABS
1.0000 | ORAL_TABLET | Freq: Once | ORAL | Status: AC
  Administered 2012-10-11: 1 via ORAL
  Filled 2012-10-11: qty 1

## 2012-10-11 MED ORDER — BACITRACIN-NEOMYCIN-POLYMYXIN 400-5-5000 EX OINT
TOPICAL_OINTMENT | Freq: Once | CUTANEOUS | Status: AC
  Administered 2012-10-11: 1 via TOPICAL
  Filled 2012-10-11: qty 1

## 2012-10-11 MED ORDER — LIDOCAINE HCL (PF) 1 % IJ SOLN
INTRAMUSCULAR | Status: AC
  Filled 2012-10-11: qty 5

## 2012-10-11 MED ORDER — TETANUS-DIPHTH-ACELL PERTUSSIS 5-2.5-18.5 LF-MCG/0.5 IM SUSP
0.5000 mL | Freq: Once | INTRAMUSCULAR | Status: AC
  Administered 2012-10-11: 0.5 mL via INTRAMUSCULAR
  Filled 2012-10-11: qty 0.5

## 2012-10-11 NOTE — ED Notes (Signed)
L index finger lac . No active bleeding.alert Nad

## 2012-10-11 NOTE — ED Notes (Signed)
Pt c/o laceration to left index finger today with a hay baler, bleeding controlled at triage,

## 2012-10-11 NOTE — ED Provider Notes (Signed)
CSN: [id number]     Arrival date & time 10/11/12  1719 History   First MD Initiated Contact with Patient 10/11/12 1811     Chief Complaint  Patient presents with  . Laceration   (Consider location/radiation/quality/duration/timing/severity/associated sxs/prior Treatment) Patient is a 42 y.o. male presenting with skin laceration. The history is provided by the patient.  Laceration Location:  Finger Finger laceration location:  L index finger Depth:  Through dermis Quality: jagged   Bleeding: controlled   Time since incident:  1 hour Laceration mechanism:  Metal edge Pain details:    Quality:  Throbbing   Severity:  Moderate   Timing:  Constant   Progression:  Unchanged Foreign body present:  Unable to specify Relieved by:  None tried Worsened by:  Movement and pressure Ineffective treatments:  None tried Tetanus status:  Out of date  [name] is a 42 y.o. male who presents to the ED with a laceration to his left index finger. He states that he was on the hay bailer and his finger got mashed and cut. Has had some bleeding to the area.   Past Medical History  Diagnosis Date  . Hypertension     Lab 02/2011: Normal CBC and BMet, TSH-1.6  . History of PSVT (paroxysmal supraventricular tachycardia)     s/p ablation 2006; occasional palpitations; occasional atypical left chest pain; event recorder in 03/2011-normal sinus, sinus tachycardia, single symptomatic spell with sinus rhythm  . Hyperlipidemia     Intolerant to statins causing tongue swelling  . Obstructive sleep apnea     Wears CPAP intermittantly. Followed by Dr. Gild[facility].  . Hyperlipidemia     Lipid profile in 2008:225, 770, 27,?; statin allergy-oral edema  . Gastroesophageal reflux disease   . Anxiety and depression     2009-suicidal ideation, involuntary commitment  . Congestive heart failure ?   Past Surgical History  Procedure Laterality Date  . Nasal turbinate reduction    . Shoulder  surgery      Right  . Ankle surgery      Right   Family History  Problem Relation Age of Onset  . Heart disease Father   . Heart attack Father   . Heart disease Paternal Grandfather    History  Substance Use Topics  . Smoking status: Current Every Day Smoker -- 1.00 packs/day for 23 years    Types: Cigarettes  . Smokeless tobacco: Never Used     Comment: down to 3 a day  . Alcohol Use: No    Review of Systems  Constitutional: Negative for fever.  HENT: Negative for neck pain.   Gastrointestinal: Negative for nausea and vomiting.  Musculoskeletal:       Laceration finger  Skin: Positive for wound.  Neurological: Negative for dizziness and headaches.  Psychiatric/Behavioral: The patient is not nervous/anxious.     Allergies  Simvastatin  Home Medications   Current Outpatient Rx  Name  Route  Sig  Dispense  Refill  . cyclobenzaprine (FLEXERIL) 10 MG tablet   Oral   Take 1 tablet (10 mg total) by mouth 2 (two) times daily as needed for muscle spasms.   20 tablet   0   . esomeprazole (NEXIUM) 40 MG capsule   Oral   Take 40 mg by mouth daily before breakfast.           . lamoTRIgine (LAMICTAL) 100 MG tablet   Oral   Take 100 mg by mouth daily.         Marland Kitchen  metoprolol (LOPRESSOR) 50 MG tablet   Oral   Take 100 mg by mouth every morning.          . nitroGLYCERIN (NITROSTAT) 0.4 MG SL tablet   Sublingual   Place 0.4 mg under the tongue every 5 (five) minutes as needed. x3 doses as needed for chest pain         . oxyCODONE-acetaminophen (PERCOCET/ROXICET) 5-325 MG per tablet   Oral   Take 1 tablet by mouth every 4 (four) hours as needed for pain.   20 tablet   0   . predniSONE (DELTASONE) 10 MG tablet   Oral   Take 2 tablets (20 mg total) by mouth 2 (two) times daily.   16 tablet   0    BP 137/87  Pulse 82  Temp(Src) 98.2 F (36.8 C) (Oral)  Resp 18  Ht 6' (1.829 m)  Wt 225 lb (102.059 kg)  BMI 30.51 kg/m2  SpO2 99% Physical Exam  Nursing  note and vitals reviewed. Constitutional: He is oriented to person, place, and time. He appears well-developed and well-nourished. No distress.  HENT:  Head: Normocephalic and atraumatic.  Eyes: Conjunctivae and EOM are normal.  Neck: Neck supple.  Cardiovascular: Normal rate.   Pulmonary/Chest: Effort normal.  Abdominal: Soft. There is no tenderness.  Musculoskeletal: Normal range of motion.       Left hand: He exhibits tenderness and laceration. He exhibits normal range of motion, normal two-point discrimination, normal capillary refill and no deformity. Normal strength noted.       Hands: Radial pulse strong, adequate circulation.   Neurological: He is alert and oriented to person, place, and time. No cranial nerve deficit.  Skin: Skin is warm and dry.  Psychiatric: He has a normal mood and affect. His behavior is normal.    ED Course LACERATION REPAIR Performed by: NEESE,HOPE Authorized by: NEESE,HOPE Consent: Verbal consent obtained. Risks and benefits: risks, benefits and alternatives were discussed Consent given by: patient Patient identity confirmed: provided demographic data Prepped and Draped in normal sterile fashion Wound explored  Laceration Location:left index finger  Laceration Length:  2 cm  Anesthesia: local infiltration  Local anesthetic: lidocaine 1% without epinephrine  Anesthetic total: 2 ml  Irrigation method: syringe Amount of cleaning: standard  Small pieces of dirt removed from the wound with irrigation  Skin closure: 5-0 prolene  Number of sutures: 3  Technique: interrupted  Patient tolerance: Patient tolerated the procedure well with no immediate complications.  Bacitracin ointment and dressing and splint applied to the area.    ProceduresDg Finger Index Left  10/11/2012   CLINICAL DATA:  Pain post trauma  EXAM: LEFT INDEX FINGER 2+V  COMPARISON:  None.  FINDINGS: Frontal, oblique, and lateral views were obtained. There is no fracture  or dislocation. Joint spaces appear intact. No erosive change. No erosive change. No radiopaque foreign body.  IMPRESSION: No abnormality noted.   Electronically Signed   By: Bretta Bang   On: 10/11/2012 18:05    MDM  42 y.o. male with laceration and contusion to the left index finger. He is stable for discharge home without any immediate complications. He remains neurovascularly intact. He will follow up with his PCP in 8 days for suture removal. He will return here for any signs of infection. Will start antibiotics. Patient has pain medication at home.  Discussed with the patient x-ray results and plan of care and all questioned fully answered.   Medication List    TAKE  these medications       cephALEXin 500 MG capsule  Commonly known as:  KEFLEX  Take 1 capsule (500 mg total) by mouth 3 (three) times daily.      ASK your doctor about these medications       cyclobenzaprine 10 MG tablet  Commonly known as:  FLEXERIL  Take 1 tablet (10 mg total) by mouth 2 (two) times daily as needed for muscle spasms.     esomeprazole 40 MG capsule  Commonly known as:  NEXIUM  Take 40 mg by mouth daily before breakfast.     lamoTRIgine 100 MG tablet  Commonly known as:  LAMICTAL  Take 100 mg by mouth daily.     metoprolol 50 MG tablet  Commonly known as:  LOPRESSOR  Take 100 mg by mouth every morning.     nitroGLYCERIN 0.4 MG SL tablet  Commonly known as:  NITROSTAT  Place 0.4 mg under the tongue every 5 (five) minutes as needed. x3 doses as needed for chest pain     oxyCODONE-acetaminophen 5-325 MG per tablet  Commonly known as:  PERCOCET/ROXICET  Take 1 tablet by mouth every 4 (four) hours as needed for pain.     predniSONE 10 MG tablet  Commonly known as:  DELTASONE  Take 2 tablets (20 mg total) by mouth 2 (two) times daily.          [place], [place] 10/11/12 2337

## 2012-10-12 NOTE — ED Provider Notes (Signed)
Medical screening examination/treatment/procedure(s) were performed by non-physician practitioner and as supervising physician I was immediately available for consultation/collaboration.   Hurman Horn, MD 10/12/12 [phone]

## 2012-12-29 ENCOUNTER — Ambulatory Visit (HOSPITAL_COMMUNITY)
Admission: RE | Admit: 2012-12-29 | Discharge: 2012-12-29 | Disposition: A | Discharge status: Home / Self Care | Payer: Self-pay | Source: Ambulatory Visit | Attending: Family Medicine | Admitting: Family Medicine

## 2012-12-29 ENCOUNTER — Other Ambulatory Visit (HOSPITAL_COMMUNITY): Payer: Self-pay | Admitting: Family Medicine

## 2012-12-29 DIAGNOSIS — R52 Pain, unspecified: Secondary | ICD-10-CM

## 2012-12-29 DIAGNOSIS — I10 Essential (primary) hypertension: Secondary | ICD-10-CM | POA: Insufficient documentation

## 2012-12-29 DIAGNOSIS — R0602 Shortness of breath: Secondary | ICD-10-CM | POA: Insufficient documentation

## 2012-12-29 DIAGNOSIS — Z87891 Personal history of nicotine dependence: Secondary | ICD-10-CM | POA: Insufficient documentation

## 2012-12-29 DIAGNOSIS — R0789 Other chest pain: Secondary | ICD-10-CM | POA: Insufficient documentation

## 2012-12-29 MED ORDER — IOHEXOL 350 MG/ML SOLN
100.0000 mL | Freq: Once | INTRAVENOUS | Status: AC | PRN
  Administered 2012-12-29: 100 mL via INTRAVENOUS

## 2013-01-22 ENCOUNTER — Emergency Department: Payer: Self-pay | Admitting: Emergency Medicine

## 2013-01-26 ENCOUNTER — Telehealth: Payer: Self-pay

## 2013-01-26 ENCOUNTER — Telehealth: Payer: Self-pay | Admitting: Adult Health

## 2013-01-26 NOTE — Telephone Encounter (Signed)
Would like to speak with nurse to see if he needs to be seen or not / tgs

## 2013-01-26 NOTE — Telephone Encounter (Signed)
Patient called front desk today requesting to speak with nurse.Last apt was with Dr.Klein 2 yrs ago.Hx PSVT and HTN. Patient reports episodes of sharp intermittent chest pain lasting from "seconds to a minute or two" .Does not have any NTG to use.has not tried Aleve etc.Apt made to see Dr.branch [facility] 01/25/13 1120 [facility]patient advised if sx's worsen to go directly to an ED

## 2013-02-03 ENCOUNTER — Ambulatory Visit: Payer: Self-pay | Admitting: Cardiology

## 2013-02-04 ENCOUNTER — Ambulatory Visit (INDEPENDENT_AMBULATORY_CARE_PROVIDER_SITE_OTHER): Payer: BC Managed Care – PPO | Admitting: Cardiology

## 2013-02-04 ENCOUNTER — Encounter: Payer: Self-pay | Admitting: Cardiology

## 2013-02-04 ENCOUNTER — Encounter: Payer: Self-pay | Admitting: *Deleted

## 2013-02-04 VITALS — BP 128/87 | HR 74 | Ht 72.0 in | Wt 233.0 lb

## 2013-02-04 DIAGNOSIS — R079 Chest pain, unspecified: Secondary | ICD-10-CM

## 2013-02-04 DIAGNOSIS — Z8679 Personal history of other diseases of the circulatory system: Secondary | ICD-10-CM

## 2013-02-04 NOTE — Progress Notes (Signed)
Clinical Summary [name] is a 43 y.o.male seen today for follow up of history of chest pain.   1. Chest pain - pain started approximately 1.5 weeks ago. Felt like sharp pain lasting just 30 seconds, would come and go. + SOB, + dizziness. Felt like heart was beating fast. Could occur with rest or with exertion. Occurred 4-5 times that day, has not happened since. Nothing pain better or worst - felt similar to prior chest pain. CT PE ordered by his PCP showed no pulmonary embolism. - [place].  CAD risk factors: father MI in early 30s, grandfather MI i his 48s, HTN, hyperlipidemia, + tobacco   Past Medical History  Diagnosis Date  . Hypertension     Lab 02/2011: Normal CBC and BMet, TSH-1.6  . History of PSVT (paroxysmal supraventricular tachycardia)     s/p ablation 2006; occasional palpitations; occasional atypical left chest pain; event recorder in 03/2011-normal sinus, sinus tachycardia, single symptomatic spell with sinus rhythm  . Hyperlipidemia     Intolerant to statins causing tongue swelling  . Obstructive sleep apnea     Wears CPAP intermittantly. Followed by Dr. Gildardo [facility].  . Hyperlipidemia     Lipid profile in 2008:225, 770, 27,?; statin allergy-oral edema  . Gastroesophageal reflux disease   . Anxiety and depression     2009-suicidal ideation, involuntary commitment  . Congestive heart failure ?     Allergies  Allergen Reactions  . Simvastatin Rash    "I get real hot"     Current Outpatient Prescriptions  Medication Sig Dispense Refill  . cephALEXin (KEFLEX) 500 MG capsule Take 1 capsule (500 mg total) by mouth 3 (three) times daily.  40 capsule  0  . cyclobenzaprine (FLEXERIL) 10 MG tablet Take 1 tablet (10 mg total) by mouth 2 (two) times daily as needed for muscle spasms.  20 tablet  0  . esomeprazole (NEXIUM) 40 MG capsule Take 40 mg by mouth daily before breakfast.        . lamoTRIgine  (LAMICTAL) 100 MG tablet Take 100 mg by mouth daily.      . metoprolol (LOPRESSOR) 50 MG tablet Take 100 mg by mouth every morning.       . nitroGLYCERIN (NITROSTAT) 0.4 MG SL tablet Place 0.4 mg under the tongue every 5 (five) minutes as needed. x3 doses as needed for chest pain      . oxyCODONE-acetaminophen (PERCOCET/ROXICET) 5-325 MG per tablet Take 1 tablet by mouth every 4 (four) hours as needed for pain.  20 tablet  0  . predniSONE (DELTASONE) 10 MG tablet Take 2 tablets (20 mg total) by mouth 2 (two) times daily.  16 tablet  0   No current facility-administered medications for this visit.     Past Surgical History  Procedure Laterality Date  . Nasal turbinate reduction    . Shoulder surgery      Right  . Ankle surgery      Right     Allergies  Allergen Reactions  . Simvastatin Rash    "I get real hot"      Family History  Problem Relation Age of Onset  . Heart disease Father   . Heart attack Father   . Heart disease Paternal Grandfather      Social History [name] reports that he has been smoking Cigarettes.  He has a 23 pack-year smoking history. He has never used smokeless tobacco.  [name] reports that he does not drink alcohol.   Review of Systems CONSTITUTIONAL: No weight loss, fever, chills, weakness or fatigue.  HEENT: Eyes: No visual loss, blurred vision, double vision or yellow sclerae.No hearing loss, sneezing, congestion, runny nose or sore throat.  SKIN: No rash or itching.  CARDIOVASCULAR: per HPI RESPIRATORY: No shortness of breath, cough or sputum.  GASTROINTESTINAL: No anorexia, nausea, vomiting or diarrhea. No abdominal pain or blood.  GENITOURINARY: No burning on urination, no polyuria NEUROLOGICAL: No headache, dizziness, syncope, paralysis, ataxia, numbness or tingling in the extremities. No change in bowel or bladder control.  MUSCULOSKELETAL: No muscle, back pain, joint pain or stiffness.  LYMPHATICS: No enlarged nodes. No history of  splenectomy.  PSYCHIATRIC: No history of depression or anxiety.  ENDOCRINOLOGIC: No reports of sweating, cold or heat intolerance. No polyuria or polydipsia.  Marland Kitchen.   Physical Examination p 74 bp 128/87 Wt 233 lbs BMI 32 Gen: resting comfortably, no acute distress HEENT: no scleral icterus, pupils equal round and reactive, no palptable cervical adenopathy,  CV: RRR, no m/r/g, no JVD, no carotid bruits Resp: Clear to auscultation bilaterally GI: abdomen is soft, non-tender, non-distended, normal bowel sounds, no hepatosplenomegaly MSK: extremities are warm, no edema.  Skin: warm, no rash Neuro:  no focal deficits Psych: appropriate affect   Diagnostic Studies 12/2012 CT PE IMPRESSION: No evidence of pulmonary embolism. No acute intra thoracic abnormalities.  05/2010 Stress myoview Negative stress EKG, 74% of THR. Nonspecifically abnormal stress nuclear myocardial study revealing adequate exercise tolerance, left ventricular dilatation and borderline dysfunction. The stress EKG was entirely normal, and there were no symptoms to suggest myocardial ischemia induced by exercise. On scintigraphic imaging, somewhat patchy uptake of tracer with no convincing evidence for infarction or ischemia was found; although small areas of scarring in the basilar septum and inferior segments can not be unequivocally excluded. Other findings as noted.  05/2010 Echo LVEF 50-55%, no WMAs     Assessment and Plan  1. Chest pain - atypical, similar to previous chest pain. He had a stress myoview in 05/2010 that was negative, however according to the report he only reached 74% of his target heart rate (suboptimal stress) - given the continued symptoms and several CAD risk factors including family history of early CAD, will order a ETT to further risk stratify and evaluate for ischemia.    Follow up 1 month   Antoine PocheJonathan F. Branch, M.D., F.A.C.C.

## 2013-02-04 NOTE — Patient Instructions (Signed)
Your physician has requested that you have an exercise tolerance test. For further information please visit [email or web address]. Please also follow instruction sheet, as given. Office will contact with results via phone or letter.   Follow up in  1 month

## 2013-02-12 ENCOUNTER — Encounter (HOSPITAL_COMMUNITY): Payer: Self-pay

## 2013-02-12 ENCOUNTER — Encounter: Payer: Self-pay | Admitting: Cardiology

## 2013-02-12 ENCOUNTER — Ambulatory Visit (HOSPITAL_COMMUNITY)
Admission: RE | Admit: 2013-02-12 | Discharge: 2013-02-12 | Disposition: A | Discharge status: Home / Self Care | Payer: Self-pay | Source: Ambulatory Visit | Attending: Cardiology | Admitting: Cardiology

## 2013-02-12 DIAGNOSIS — R079 Chest pain, unspecified: Secondary | ICD-10-CM

## 2013-02-12 NOTE — Progress Notes (Signed)
Stress Lab Nurses Notes - [facility]  Lavetta NielsenJoseph E Arnold 02/12/2013 Reason for doing test: Chest Pain  Type of test: Regular GTX Nurse performing test: Parke PoissonPhyllis Billingsly, RN Nuclear Medicine Tech: Not Applicable Echo Tech: Not Applicable MD performing test: Ival BibleS. McDowell /[name] Family MD: [name] Test explained and consent signed: yes IV started: No IV started Symptoms: SOB Treatment/Intervention: None Reason test stopped: SOB After recovery IV was: NA Patient to return to Nuc. Med at : NA Patient discharged: Home Patient's Condition upon discharge was: stable Comments: During test peak BP 149/74 & HR 144. Recovery BP 119/81 & HR 85.  Symptoms resolved in recovery. [name], [name]

## 2013-02-12 NOTE — Progress Notes (Signed)
Stress Lab Nurses Notes - [facility]   Lavetta NielsenJoseph E Fukuda  02/12/2013  Reason for doing test: Chest Pain  Type of test: Regular GTX  Nurse performing test: Parke PoissonPhyllis Billingsly, RN  MD performing test: Ival BibleS. Elman Dettman /[name]  Family MD: [name]  Test explained and consent signed: Yes  IV started: No IV started  Symptoms: SOB  Treatment/Intervention: None  Reason test stopped: SOB  After recovery IV was: NA  Patient discharged: Home  Patient's Condition upon discharge was: Stable  Comments: During test peak BP 149/74 & HR 144. Recovery BP 119/81 & HR 85. Symptoms resolved in recovery.  [name], Phyllis T  Attending note:  Patient exercised on a Bruce protocol for 9:08 reaching peak HR 146 BPM, only 81% MPHR. Maximum workload 10.5 METS. Study was limited by dyspnea and fatigue, no chest pain reported. Peak BP 149/74. There were no diagnostic ST segment abnormalities or arrhythmias noted. Test is nondiagnostic due to failure to achieve adequate HR response. Could consider pharmacologic imaging study (dobutamine echocardiogram or Lexiscan cardiolite) if clinical scenario warrants further noninvasive cardiac evaluation.  Jonelle SidleSamuel G. Emaline Karnes, M.D., F.A.C.C.

## 2013-02-15 ENCOUNTER — Telehealth: Payer: Self-pay | Admitting: Cardiology

## 2013-02-15 NOTE — Progress Notes (Signed)
Please let patient know that his stress test looked good. He did not quite reach his target heart rate so we cannot say definitively that the test is negative. However the fact he exercised 9 minutes with no significant EKG changes or chest pain suggests his heart is in good shape. We will readdress at our next visit, if needed we can consider an alternative type of stress test, however overall the findings are reassuring.   Dina RichJonathan Nalah Macioce MD

## 2013-02-15 NOTE — Telephone Encounter (Signed)
Message copied by Burnice LoganATES, Osmany Azer M on Mon Feb 15, 2013  4:39 PM ------      Message from: HoltsvilleBRANCH, JONATHAN F      Created: Mon Feb 15, 2013  2:02 PM                   ----- Message -----         From: Jonelle SidleSamuel G McDowell, MD         Sent: 02/12/2013   2:32 PM           To: Antoine PocheJonathan F Branch, MD             ------

## 2013-02-15 NOTE — Telephone Encounter (Signed)
Informed pt of results. Pt verbalized understanding. 

## 2013-03-15 ENCOUNTER — Encounter: Payer: Self-pay | Admitting: Cardiology

## 2013-03-15 ENCOUNTER — Telehealth: Payer: Self-pay | Admitting: Cardiology

## 2013-03-15 NOTE — Telephone Encounter (Signed)
noted 

## 2013-03-15 NOTE — Telephone Encounter (Signed)
Called [name] in reference to his appointment for today. States that his car is broke down and he has been unable to Take time to call the office. Will call back to re-schedule.

## 2013-03-15 NOTE — Telephone Encounter (Signed)
LM for pt to return call so appt from Monday 2-23 can be rescheduled.

## 2013-03-15 NOTE — Progress Notes (Signed)
ERROR No Show  

## 2013-03-19 ENCOUNTER — Encounter: Payer: Self-pay | Admitting: Cardiology

## 2013-03-19 ENCOUNTER — Ambulatory Visit (INDEPENDENT_AMBULATORY_CARE_PROVIDER_SITE_OTHER): Payer: Self-pay | Admitting: Cardiology

## 2013-03-19 ENCOUNTER — Encounter: Payer: Self-pay | Admitting: *Deleted

## 2013-03-19 VITALS — BP 125/82 | HR 80 | Ht 72.0 in | Wt 241.8 lb

## 2013-03-19 DIAGNOSIS — R0602 Shortness of breath: Secondary | ICD-10-CM

## 2013-03-19 DIAGNOSIS — R079 Chest pain, unspecified: Secondary | ICD-10-CM

## 2013-03-19 MED ORDER — ASPIRIN EC 81 MG PO TBEC: 81.0000 mg | DELAYED_RELEASE_TABLET | Freq: Every day | ORAL | Status: DC

## 2013-03-19 NOTE — Progress Notes (Signed)
Clinical Summary [name] is a 43 y.o.male seen today for follow up of the following medical problems   1. Chest pain  -  Felt like sharp pain lasting just 30 seconds, would come and go. + SOB, + dizziness. Felt like heart was beating fast. Could occur with rest or with exertion. Occurred 4-5 times that day, has not happened since. Nothing pain better or worst  - felt similar to prior chest pain. CT PE ordered by his PCP showed no pulmonary embolism.  - [place].  CAD risk factors: father MI in early 5440s, grandfather MI i his 1950s, HTN, hyperlipidemia, + tobacco  - since last visit completed exercise stress, only reached 81% of THR howevere exercised 9 minutes with no ischemic EKG changes.   - has had some chest pain since last visit. Episode few weeks ago, sharp pain left sided. More severe from prior episodes, 8/10. + SOB. + Nausea. Occurred at rest. Lasted for approx 45 seconds to 1minute.    Past Medical History  Diagnosis Date  . Hypertension     Lab 02/2011: Normal CBC and BMet, TSH-1.6  . History of PSVT (paroxysmal supraventricular tachycardia)     s/p ablation 2006; occasional palpitations; occasional atypical left chest pain; event recorder in 03/2011-normal sinus, sinus tachycardia, single symptomatic spell with sinus rhythm  . Hyperlipidemia     Intolerant to statins causing tongue swelling  . Obstructive sleep apnea     Wears CPAP intermittantly. [facility].  . Hyperlipidemia     Lipid profile in 2008:225, 770, 27,?; statin allergy-oral edema  . Gastroesophageal reflux disease   . Anxiety and depression     2009-suicidal ideation, involuntary commitment  . Congestive heart failure ?     Allergies  Allergen Reactions  . Simvastatin Rash    "I get real hot"     Current Outpatient Prescriptions  Medication Sig Dispense Refill  . esomeprazole (NEXIUM) 40 MG capsule Take 40 mg by  mouth daily before breakfast.        . metoprolol succinate (TOPROL-XL) 100 MG 24 hr tablet Take 100 mg by mouth daily. Take with or immediately following a meal.       No current facility-administered medications for this visit.     Past Surgical History  Procedure Laterality Date  . Nasal turbinate reduction    . Shoulder surgery      Right  . Ankle surgery      Right     Allergies  Allergen Reactions  . Simvastatin Rash    "I get real hot"      Family History  Problem Relation Age of Onset  . Heart disease Father   . Heart attack Father   . Heart disease Paternal Grandfather      Social History [name] reports that he has been smoking Cigarettes.  He started smoking about 25 years ago. He has a 23 pack-year smoking history. He has never used smokeless tobacco. [name] reports that he does not drink alcohol.   Review of Systems CONSTITUTIONAL: No weight loss, fever, chills, weakness or fatigue.  HEENT: Eyes: No visual loss, blurred vision, double vision or yellow sclerae.No hearing loss, sneezing, congestion, runny nose or sore throat.  SKIN: No rash or itching.  CARDIOVASCULAR: per HPI RESPIRATORY:per HPI GASTROINTESTINAL: No anorexia, nausea, vomiting or diarrhea. No abdominal pain or blood.  GENITOURINARY: No burning on urination, no  polyuria NEUROLOGICAL: No headache, dizziness, syncope, paralysis, ataxia, numbness or tingling in the extremities. No change in bowel or bladder control.  MUSCULOSKELETAL: No muscle, back pain, joint pain or stiffness.  LYMPHATICS: No enlarged nodes. No history of splenectomy.  PSYCHIATRIC: No history of depression or anxiety.  ENDOCRINOLOGIC: No reports of sweating, cold or heat intolerance. No polyuria or polydipsia.  Marland Kitchen   Physical Examination Filed Vitals:   03/19/13 1620  BP: 125/82  Pulse: 80   Filed Weights   03/19/13 1620  Weight: 241 lb 12.8 oz (109.68 kg)    Gen: resting comfortably, no acute  distress HEENT: no scleral icterus, pupils equal round and reactive, no palptable cervical adenopathy,  CV: RRR, no m/r/g, no JVD, no carotid bruits Resp: Clear to auscultation bilaterally GI: abdomen is soft, non-tender, non-distended, normal bowel sounds, no hepatosplenomegaly MSK: extremities are warm, no edema.  Skin: warm, no rash Neuro:  no focal deficits Psych: appropriate affect   Diagnostic Studies 12/2012 CT PE  IMPRESSION: No evidence of pulmonary embolism. No acute intra thoracic abnormalities.  05/2010 Stress myoview  Negative stress EKG, 74% of THR.  Nonspecifically abnormal stress nuclear myocardial study revealing adequate exercise tolerance, left ventricular dilatation and borderline dysfunction. The stress EKG was entirely normal, and there were no symptoms to suggest myocardial ischemia induced by exercise. On scintigraphic imaging, somewhat patchy uptake of tracer with no convincing evidence for infarction or ischemia was found; although small areas of scarring in the basilar septum and inferior segments can not be unequivocally excluded. Other findings as noted.  05/2010 Echo  LVEF 50-55%, no WMAs    01/2013 Exercise Stress test  9 min, 81% THR, 10.5 METs, no ST/T changes.      Assessment and Plan  1. Chest pain  - recent exercise stress test, did not reach target heart rate. - continued symptoms of chest pain. Will refer for pharmacological stress test - obtain echo, he describes some increased DOE and orthopnea   Follow up 1 month    Antoine Poche, M.D., F.A.C.C.

## 2013-03-19 NOTE — Patient Instructions (Signed)
Your physician recommends that you schedule a follow-up appointment in: 1 month. Your physician has recommended you make the following change in your medication: Start aspirin 81 mg daily. All other medications will remain the same. Your physician has requested that you have an echocardiogram. Echocardiography is a painless test that uses sound waves to create images of your heart. It provides your doctor with information about the size and shape of your heart and how well your heart's chambers and valves are working. This procedure takes approximately one hour. There are no restrictions for this procedure. Your physician has requested that you have a lexiscan myoview. For further information please visit [email or web address]. Please follow instruction sheet, as given.

## 2013-03-24 ENCOUNTER — Other Ambulatory Visit (INDEPENDENT_AMBULATORY_CARE_PROVIDER_SITE_OTHER): Payer: Self-pay

## 2013-03-24 ENCOUNTER — Other Ambulatory Visit: Payer: Self-pay

## 2013-03-24 DIAGNOSIS — R079 Chest pain, unspecified: Secondary | ICD-10-CM

## 2013-03-24 DIAGNOSIS — R0602 Shortness of breath: Secondary | ICD-10-CM

## 2013-03-26 ENCOUNTER — Emergency Department (HOSPITAL_COMMUNITY)
Admission: EM | Admit: 2013-03-26 | Discharge: 2013-03-26 | Disposition: A | Discharge status: Home / Self Care | Payer: Self-pay | Attending: Emergency Medicine | Admitting: Emergency Medicine

## 2013-03-26 ENCOUNTER — Emergency Department (HOSPITAL_COMMUNITY): Payer: Self-pay

## 2013-03-26 ENCOUNTER — Encounter (HOSPITAL_COMMUNITY): Payer: Self-pay | Admitting: Emergency Medicine

## 2013-03-26 DIAGNOSIS — W64XXXA Exposure to other animate mechanical forces, initial encounter: Secondary | ICD-10-CM | POA: Insufficient documentation

## 2013-03-26 DIAGNOSIS — Z8659 Personal history of other mental and behavioral disorders: Secondary | ICD-10-CM | POA: Insufficient documentation

## 2013-03-26 DIAGNOSIS — S8390XA Sprain of unspecified site of unspecified knee, initial encounter: Secondary | ICD-10-CM

## 2013-03-26 DIAGNOSIS — Z862 Personal history of diseases of the blood and blood-forming organs and certain disorders involving the immune mechanism: Secondary | ICD-10-CM | POA: Insufficient documentation

## 2013-03-26 DIAGNOSIS — X500XXA Overexertion from strenuous movement or load, initial encounter: Secondary | ICD-10-CM | POA: Insufficient documentation

## 2013-03-26 DIAGNOSIS — I509 Heart failure, unspecified: Secondary | ICD-10-CM | POA: Insufficient documentation

## 2013-03-26 DIAGNOSIS — Y929 Unspecified place or not applicable: Secondary | ICD-10-CM | POA: Insufficient documentation

## 2013-03-26 DIAGNOSIS — IMO0002 Reserved for concepts with insufficient information to code with codable children: Secondary | ICD-10-CM | POA: Insufficient documentation

## 2013-03-26 DIAGNOSIS — G4733 Obstructive sleep apnea (adult) (pediatric): Secondary | ICD-10-CM | POA: Insufficient documentation

## 2013-03-26 DIAGNOSIS — F17200 Nicotine dependence, unspecified, uncomplicated: Secondary | ICD-10-CM | POA: Insufficient documentation

## 2013-03-26 DIAGNOSIS — Z9981 Dependence on supplemental oxygen: Secondary | ICD-10-CM | POA: Insufficient documentation

## 2013-03-26 DIAGNOSIS — Z79899 Other long term (current) drug therapy: Secondary | ICD-10-CM | POA: Insufficient documentation

## 2013-03-26 DIAGNOSIS — Z8639 Personal history of other endocrine, nutritional and metabolic disease: Secondary | ICD-10-CM | POA: Insufficient documentation

## 2013-03-26 DIAGNOSIS — I10 Essential (primary) hypertension: Secondary | ICD-10-CM | POA: Insufficient documentation

## 2013-03-26 DIAGNOSIS — Z7982 Long term (current) use of aspirin: Secondary | ICD-10-CM | POA: Insufficient documentation

## 2013-03-26 DIAGNOSIS — K219 Gastro-esophageal reflux disease without esophagitis: Secondary | ICD-10-CM | POA: Insufficient documentation

## 2013-03-26 DIAGNOSIS — Y939 Activity, unspecified: Secondary | ICD-10-CM | POA: Insufficient documentation

## 2013-03-26 MED ORDER — HYDROCODONE-ACETAMINOPHEN 5-325 MG PO TABS
2.0000 | ORAL_TABLET | Freq: Once | ORAL | Status: AC
  Administered 2013-03-26: 2 via ORAL
  Filled 2013-03-26: qty 2

## 2013-03-26 MED ORDER — DICLOFENAC SODIUM 75 MG PO TBEC: 75.0000 mg | DELAYED_RELEASE_TABLET | Freq: Two times a day (BID) | ORAL | Status: DC

## 2013-03-26 MED ORDER — IBUPROFEN 800 MG PO TABS
800.0000 mg | ORAL_TABLET | Freq: Once | ORAL | Status: AC
  Administered 2013-03-26: 800 mg via ORAL
  Filled 2013-03-26: qty 1

## 2013-03-26 MED ORDER — HYDROCODONE-ACETAMINOPHEN 7.5-325 MG PO TABS: 1.0000 | ORAL_TABLET | ORAL | Status: DC | PRN

## 2013-03-26 MED ORDER — PROMETHAZINE HCL 12.5 MG PO TABS
12.5000 mg | ORAL_TABLET | Freq: Once | ORAL | Status: AC
  Administered 2013-03-26: 12.5 mg via ORAL
  Filled 2013-03-26: qty 1

## 2013-03-26 MED ORDER — HYDROMORPHONE HCL PF 2 MG/ML IJ SOLN
2.0000 mg | Freq: Once | INTRAMUSCULAR | Status: AC
  Administered 2013-03-26: 2 mg via INTRAMUSCULAR
  Filled 2013-03-26: qty 1

## 2013-03-26 NOTE — Discharge Instructions (Signed)
The x-ray of the knee as well as a CT of the knee is negative for fracture or effusion. Please see Dr. Hilda LiasKeeling for additional evaluation and management should your discomfort continue. Please use the knee immobilizer over the next 7 days. Please use crutches until you're able to safely apply weight. Please use diclofenac 2 times daily with food. Norco may cause drowsiness, please use with caution. Knee Sprain A knee sprain is a tear in the strong bands of tissue that connect the bones (ligaments) of your knee. HOME CARE  Raise (elevate) your injured knee to lessen puffiness (swelling).  To ease pain and puffiness, put ice on the injured area.  Put ice in a plastic bag.  Place a towel between your skin and the bag.  Leave the ice on for 20 minutes, 2 3 times a day.  Only take medicine as told by your doctor.  Do not leave your knee unprotected until pain and stiffness go away (usually 4 6 weeks).  If you have a cast or splint, do not get it wet. If your doctor told you to not take it off, cover it with a plastic bag when you shower or bathe. Do not swim.  Your doctor may have you do exercises to prevent or limit permanent weakness and stiffness. GET HELP RIGHT AWAY IF:   Your cast or splint becomes damaged.  Your pain gets worse.  You have a lot of pain, puffiness, or numbness below the cast or splint. MAKE SURE YOU:   Understand these instructions.  Will watch your condition.  Will get help right away if you are not doing well or get worse. Document Released: 12/26/2008 Document Revised: 10/28/2012 Document Reviewed: 09/15/2012 [facility] Patient Information 2014 [employer].

## 2013-03-26 NOTE — ED Notes (Addendum)
Pain lt knee , pulled down by horse  Ice pack applied.

## 2013-03-26 NOTE — ED Notes (Signed)
Discharge instructions and prescriptions given and reviewed with patient. Patient verbalized understanding of sedating effects of medications and to follow up with orthopedic if not getting better.  Patient instructed to wear knee immobilizer for 7 days and use crutches until able to bear weight.

## 2013-03-26 NOTE — ED Provider Notes (Signed)
CSN: [id number]     Arrival date & time 03/26/13  1940 History   This chart was scribed for Ivery QualeHobson Chauna Osoria by Ladona Ridgelaylor Day, ED scribe. This patient was seen in room APFT23/APFT23 and the patient's care was started at 1940.  Chief Complaint  Patient presents with  . Knee Pain   The history is provided by the patient. No language interpreter was used.   HPI Comments: [name] is a 43 y.o. male who presents to the Emergency Department complaining of acute, sudden onset left knee pain after he was hit in the knee by a horse and caused his left knee to hyperextend. He reports pain w/ROM and difficult to bear weight secondary to pain since time of injury. He denies any previous surgeries or operations of his left knee. He takes no blood thinner medicine but he does take blood pressure medicine.  Past Medical History  Diagnosis Date  . Hypertension     Lab 02/2011: Normal CBC and BMet, TSH-1.6  . History of PSVT (paroxysmal supraventricular tachycardia)     s/p ablation 2006; occasional palpitations; occasional atypical left chest pain; event recorder in 03/2011-normal sinus, sinus tachycardia, single symptomatic spell with sinus rhythm  . Hyperlipidemia     Intolerant to statins causing tongue swelling  . Obstructive sleep apnea     Wears CPAP intermittantly. [facility].  . Hyperlipidemia     Lipid profile in 2008:225, 770, 27,?; statin allergy-oral edema  . Gastroesophageal reflux disease   . Anxiety and depression     2009-suicidal ideation, involuntary commitment  . Congestive heart failure ?   Past Surgical History  Procedure Laterality Date  . Nasal turbinate reduction    . Shoulder surgery      Right  . Ankle surgery      Right   Family History  Problem Relation Age of Onset  . Heart disease Father   . Heart attack Father   . Heart disease Paternal Grandfather    History  Substance Use Topics  . Smoking status: Current Every Day Smoker --  1.00 packs/day for 23 years    Types: Cigarettes    Start date: 06/30/1987  . Smokeless tobacco: Never Used  . Alcohol Use: No    Review of Systems  Constitutional: Negative for fever and chills.  Respiratory: Negative for cough and shortness of breath.   Cardiovascular: Negative for chest pain.  Gastrointestinal: Negative for abdominal pain.  Musculoskeletal: Negative for back pain.       Left knee pain  All other systems reviewed and are negative.    Allergies  Simvastatin  Home Medications   Current Outpatient Rx  Name  Route  Sig  Dispense  Refill  . aspirin EC 81 MG tablet   Oral   Take 1 tablet (81 mg total) by mouth daily.   90 tablet   3   . esomeprazole (NEXIUM) 40 MG capsule   Oral   Take 40 mg by mouth daily before breakfast.           . metoprolol succinate (TOPROL-XL) 100 MG 24 hr tablet   Oral   Take 100 mg by mouth daily. Take with or immediately following a meal.          Triage Vitals: BP 132/83  Pulse 117  Temp(Src) 98.2 F (36.8 C) (Oral)  Resp 20  Ht 6' (1.829 m)  Wt 225 lb (102.059 kg)  BMI 30.51 kg/m2  SpO2 96%  Physical Exam  Nursing note and vitals reviewed. Constitutional: He is oriented to person, place, and time. He appears well-developed and well-nourished. No distress.  HENT:  Head: Normocephalic and atraumatic.  Eyes: Conjunctivae are normal. Right eye exhibits no discharge. Left eye exhibits no discharge.  Neck: Normal range of motion.  Cardiovascular: Normal rate and intact distal pulses.   DP pulses are 2+   Pulmonary/Chest: Effort normal. No respiratory distress.  Musculoskeletal: Normal range of motion. He exhibits tenderness. He exhibits no edema.  Left knee with medial and lateral joint line tenderness to palpation w/no effusion. No deformity of his tibial tuberosity. Full ROM of his ankles and toes.   Neurological: He is alert and oriented to person, place, and time.  No sensory deficit of the left lower  extremity   Skin: Skin is warm and dry.  Psychiatric: He has a normal mood and affect. Thought content normal.   ED Course  Procedures (including critical care time) DIAGNOSTIC STUDIES: Oxygen Saturation is 96% on room air, adequate by my interpretation.    COORDINATION OF CARE: At 903 PM Discussed treatment plan with patient which includes left knee X-ray, left knee CT, pain medicine. Patient agrees.   Labs Review Labs Reviewed - No data to display Imaging Review Dg Knee Complete 4 Views Left  03/26/2013   CLINICAL DATA:  Knee pain.  EXAM: LEFT KNEE - COMPLETE 4+ VIEW  COMPARISON:  08/22/2010  FINDINGS: There is no evidence of fracture, dislocation, or joint effusion. There is no evidence of arthropathy or other focal bone abnormality. Soft tissues are unremarkable.  IMPRESSION: Negative.   Electronically Signed   By: Charlett Nose M.D.   On: 03/26/2013 20:22     EKG Interpretation None      MDM Patient sustained an injury to the left knee while she was worse today. He states that the worse bump into his knee, he also reports having hyperextended the knee, and has had severe pain since that time. X-ray of the left knee was negative for any fracture or dislocation. Patient has severe pain even with light touch to the knee. Inconsistent with findings. CT scan of the knee obtained.   CT scan of the knee is negative for acute event. Pt fitted with knee immobilizer and crutches. Rx for diclofenac and norco given. Pt to follow up with orthopedics.   Final diagnoses:  Knee sprain    *I have reviewed nursing notes, vital signs, and all appropriate lab and imaging results for this patient.**  I personally performed the services described in this documentation, which was scribed in my presence. The recorded information has been reviewed and is accurate.     Kathie Dike, PA-C 03/27/13 0004

## 2013-03-27 NOTE — ED Provider Notes (Signed)
Medical screening examination/treatment/procedure(s) were performed by non-physician practitioner and as supervising physician I was immediately available for consultation/collaboration.   EKG Interpretation None        Lucas MawKristen N Mohab Ashby, DO 03/27/13 0111

## 2013-03-29 ENCOUNTER — Telehealth: Payer: Self-pay | Admitting: Cardiology

## 2013-03-29 NOTE — Telephone Encounter (Signed)
LM on VM for pt to return call. 

## 2013-03-29 NOTE — Telephone Encounter (Signed)
Message copied by Burnice LoganATES, Svara Twyman M on Mon Mar 29, 2013  5:03 PM ------      Message from: Eustace MooreANDERSON, LYDIA M      Created: Mon Mar 29, 2013  3:52 PM                   ----- Message -----         From: Antoine PocheJonathan F Branch, MD         Sent: 03/29/2013   3:44 PM           To: Eustace MooreLydia M Anderson, LPN            Please let patient know that echo looks good            Dina RichJonathan Branch MD ------

## 2013-03-30 ENCOUNTER — Encounter (HOSPITAL_COMMUNITY): Payer: Self-pay

## 2013-03-30 ENCOUNTER — Encounter (HOSPITAL_COMMUNITY)
Admission: RE | Admit: 2013-03-30 | Discharge: 2013-03-30 | Disposition: A | Discharge status: Home / Self Care | Payer: Self-pay | Source: Ambulatory Visit | Attending: Cardiology | Admitting: Cardiology

## 2013-03-30 DIAGNOSIS — R0602 Shortness of breath: Secondary | ICD-10-CM | POA: Insufficient documentation

## 2013-03-30 DIAGNOSIS — R079 Chest pain, unspecified: Secondary | ICD-10-CM

## 2013-03-30 MED ORDER — TECHNETIUM TC 99M SESTAMIBI GENERIC - CARDIOLITE
30.0000 | Freq: Once | INTRAVENOUS | Status: AC | PRN
  Administered 2013-03-30: 30 via INTRAVENOUS

## 2013-03-30 MED ORDER — TECHNETIUM TC 99M SESTAMIBI - CARDIOLITE
10.0000 | Freq: Once | INTRAVENOUS | Status: AC | PRN
  Administered 2013-03-30: 10 via INTRAVENOUS

## 2013-03-30 MED ORDER — SODIUM CHLORIDE 0.9 % IJ SOLN
INTRAMUSCULAR | Status: AC
  Administered 2013-03-30: 10 mL via INTRAVENOUS
  Filled 2013-03-30: qty 10

## 2013-03-30 MED ORDER — REGADENOSON 0.4 MG/5ML IV SOLN
INTRAVENOUS | Status: AC
  Administered 2013-03-30: 0.4 mg via INTRAVENOUS
  Filled 2013-03-30: qty 5

## 2013-03-30 NOTE — Telephone Encounter (Signed)
Pt called back and was informed of results. Pt verbalized understanding.

## 2013-03-30 NOTE — Telephone Encounter (Signed)
Message copied by Burnice LoganATES, Derak Schurman M on Tue Mar 30, 2013  9:19 AM ------      Message from: Eustace MooreANDERSON, LYDIA M      Created: Mon Mar 29, 2013  3:52 PM                   ----- Message -----         From: Antoine PocheJonathan F Branch, MD         Sent: 03/29/2013   3:44 PM           To: Eustace MooreLydia M Anderson, LPN            Please let patient know that echo looks good            Dina RichJonathan Branch MD ------

## 2013-03-30 NOTE — Progress Notes (Signed)
Stress Lab Nurses Notes - [facility]  Lavetta NielsenJoseph E Rettinger 03/30/2013 Reason for doing test: Chest Pain and Dyspnea Type of test: Marlane HatcherLexiscan Cardiolite Nurse performing test: Parke PoissonPhyllis Billingsly, RN Nuclear Medicine Tech: Lou CalLeslie Walker Echo Tech: Not Applicable MD performing test: Dr. Sondra BargesBranch/[name] Family MD: [facility] Test explained and consent signed: yes IV started: 22g jelco, Saline lock flushed, No redness or edema and Saline lock started in radiology Symptoms: Nausea & discomfort in arms (tingling) Treatment/Intervention: None Reason test stopped: protocol completed After recovery IV was: Discontinued via X-ray tech and No redness or edema Patient to return to Nuc. Med at : 10:00 Patient discharged: Home Patient's Condition upon discharge was: stable Comments: During test BP 109/67 & HR 91.  Recovery BP 119/71 & HR 83.  Symptoms resolved in recovery.  [name], Denya Buckingham T

## 2013-04-06 ENCOUNTER — Telehealth: Payer: Self-pay | Admitting: Cardiology

## 2013-04-06 NOTE — Telephone Encounter (Signed)
Message copied by Burnice LoganATES, Samael Blades M on Tue Apr 06, 2013  2:16 PM ------      Message from: Eustace MooreANDERSON, LYDIA M      Created: Tue Apr 06, 2013 12:07 PM                   ----- Message -----         From: Antoine PocheJonathan F Branch, MD         Sent: 04/02/2013   3:30 PM           To: Eustace MooreLydia M Anderson, LPN            Please let patient know that stress test is normal.                   Dina RichJonathan Branch MD ------

## 2013-04-06 NOTE — Telephone Encounter (Signed)
Informed pt of results and reminded pt to keep all follow up appointments with Dr. Wyline MoodBranch. Pt verbalized understanding.

## 2013-04-16 ENCOUNTER — Ambulatory Visit: Payer: Self-pay | Admitting: Cardiology

## 2013-04-23 ENCOUNTER — Encounter: Payer: Self-pay | Admitting: Cardiology

## 2013-04-23 ENCOUNTER — Ambulatory Visit (INDEPENDENT_AMBULATORY_CARE_PROVIDER_SITE_OTHER): Payer: Self-pay | Admitting: Cardiology

## 2013-04-23 VITALS — BP 117/80 | HR 73 | Ht 72.0 in | Wt 242.0 lb

## 2013-04-23 DIAGNOSIS — R0602 Shortness of breath: Secondary | ICD-10-CM

## 2013-04-23 DIAGNOSIS — Z720 Tobacco use: Secondary | ICD-10-CM

## 2013-04-23 DIAGNOSIS — R002 Palpitations: Secondary | ICD-10-CM

## 2013-04-23 DIAGNOSIS — R079 Chest pain, unspecified: Secondary | ICD-10-CM

## 2013-04-23 DIAGNOSIS — F17200 Nicotine dependence, unspecified, uncomplicated: Secondary | ICD-10-CM

## 2013-04-23 NOTE — Progress Notes (Signed)
Clinical Summary [name] is a 43 y.o.male  Seen today for follow up of the following medical problems.   1. Chest pain  - no severe symptoms since last visit, can have some occasional mild chest discomfort with palpitations which is different from his prior symptoms - since last visit completed stress test, Lexiscan MPI showed no ischemia. Echo showed LVEF 55-60%  2. Palpitations/hx of psvt w/ ablation - has had a few episodes of chest pain since last visit, can be associated with palpitations with SOB. Occurs twice a week. Lasts for a few minutes. Feels similar to previous palpitations prior to ablation - reports drink occasional caffeine - compliant with Toprol  3. Tobacco - x 24 years, 1 ppd - reports +wheezing, chronic cough - tried nicotine patch, e-cigs to quit without benefit  Past Medical History  Diagnosis Date  . Hypertension     Lab 02/2011: Normal CBC and BMet, TSH-1.6  . History of PSVT (paroxysmal supraventricular tachycardia)     s/p ablation 2006; occasional palpitations; occasional atypical left chest pain; event recorder in 03/2011-normal sinus, sinus tachycardia, single symptomatic spell with sinus rhythm  . Hyperlipidemia     Intolerant to statins causing tongue swelling  . Obstructive sleep apnea     Wears CPAP intermittantly. Followed by Dr. [facility].  . Hyperlipidemia     Lipid profile in 2008:225, 770, 27,?; statin allergy-oral edema  . Gastroesophageal reflux disease   . Anxiety and depression     2009-suicidal ideation, involuntary commitment  . Congestive heart failure ?     Allergies  Allergen Reactions  . Simvastatin Rash    "I get real hot"     Current Outpatient Prescriptions  Medication Sig Dispense Refill  . aspirin EC 81 MG tablet Take 1 tablet (81 mg total) by mouth daily.  90 tablet  3  . diclofenac (VOLTAREN) 75 MG EC tablet Take 1 tablet (75 mg total) by mouth 2 (two) times daily.  12 tablet  0  .  esomeprazole (NEXIUM) 40 MG capsule Take 40 mg by mouth daily before breakfast.        . HYDROcodone-acetaminophen (NORCO) 7.5-325 MG per tablet Take 1 tablet by mouth every 4 (four) hours as needed for moderate pain.  15 tablet  0  . metaxalone (SKELAXIN) 800 MG tablet Take 800 mg by mouth daily as needed for muscle spasms.      . metoprolol succinate (TOPROL-XL) 100 MG 24 hr tablet Take 100 mg by mouth daily. Take with or immediately following a meal.       No current facility-administered medications for this visit.     Past Surgical History  Procedure Laterality Date  . Nasal turbinate reduction    . Shoulder surgery      Right  . Ankle surgery      Right     Allergies  Allergen Reactions  . Simvastatin Rash    "I get real hot"      Family History  Problem Relation Age of Onset  . Heart disease Father   . Heart attack Father   . Heart disease Paternal Grandfather      Social History [name] reports that he has been smoking Cigarettes.  He started smoking about 25 years ago. He has a 23 pack-year smoking history. He has never used smokeless tobacco. [name] reports that he does not drink alcohol.   Review of Systems CONSTITUTIONAL: No weight loss, fever, chills, weakness  or fatigue.  HEENT: Eyes: No visual loss, blurred vision, double vision or yellow sclerae.No hearing loss, sneezing, congestion, runny nose or sore throat.  SKIN: No rash or itching.  CARDIOVASCULAR: per HPI RESPIRATORY: per HPI  GASTROINTESTINAL: No anorexia, nausea, vomiting or diarrhea. No abdominal pain or blood.  GENITOURINARY: No burning on urination, no polyuria NEUROLOGICAL: No headache, dizziness, syncope, paralysis, ataxia, numbness or tingling in the extremities. No change in bowel or bladder control.  MUSCULOSKELETAL: No muscle, back pain, joint pain or stiffness.  LYMPHATICS: No enlarged nodes. No history of splenectomy.  PSYCHIATRIC: No history of depression or anxiety.    ENDOCRINOLOGIC: No reports of sweating, cold or heat intolerance. No polyuria or polydipsia.  Marland Kitchen.   Physical Examination p 73 bp 117/80 Wt 242 lbs BMI 33 Gen: resting comfortably, no acute distress HEENT: no scleral icterus, pupils equal round and reactive, no palptable cervical adenopathy,  CV: RRR, no m/r/g, no JVD, no carotid bruits Resp: Clear to auscultation bilaterally GI: abdomen is soft, non-tender, non-distended, normal bowel sounds, no hepatosplenomegaly MSK: extremities are warm, no edema.  Skin: warm, no rash Neuro:  no focal deficits Psych: appropriate affect   Diagnostic Studies  03/2013 Echo LVEF 55-60%, mjild LVH, normal diastolic function  02/2013 Lexiscan MPI No ischemia   Assessment and Plan  1. Chest pain - negative stress test, no evidence of ischemia at etiology  2. Palpitations - hx of prior psvt with prior ablation - reports recent increase in palpitations - will obtain a 7 day event monitor  3. 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 Katonya Blecher, M.D., F.A.C.C.

## 2013-04-23 NOTE — Patient Instructions (Signed)
Your physician recommends that you schedule a follow-up appointment in: 3 months with Dr. Wyline MoodBranch. You should receive a letter in the mail in 1-2 months. If you do not receive this letter by May or June 2015 call our office to schedule this appointment.   Your physician recommends that you continue on your current medications as directed. Please refer to the Current Medication list given to you today.  Your physician has recommended that you wear an event monitor for seven days. Event monitors are medical devices that record the heart's electrical activity. Doctors most often us these monitors to diagnose arrhythmias. Arrhythmias are problems with the speed or rhythm of the heartbeat. The monitor is a small, portable device. You can wear one while you do your normal daily activities. This is usually used to diagnose what is causing palpitations/syncope (passing out).  Your physician has recommended that you have a pulmonary function test. Pulmonary Function Tests are a group of tests that measure how well air moves in and out of your lungs.

## 2013-05-02 DIAGNOSIS — R002 Palpitations: Secondary | ICD-10-CM

## 2013-05-13 ENCOUNTER — Ambulatory Visit (HOSPITAL_COMMUNITY): Admission: RE | Admit: 2013-05-13 | Payer: Self-pay | Source: Ambulatory Visit

## 2013-05-19 ENCOUNTER — Telehealth: Payer: Self-pay | Admitting: Cardiology

## 2013-05-19 NOTE — Telephone Encounter (Signed)
Called and informed pt on monitor results. Pt stated that he did not wear the monitor but 1 hour and that he called [employer] and they informed him that they would send him new electrodes to wear. He stated that [employer] never done this and that he still has the monitor in his possession. I informed pt that [employer] sent us a end of service and that we printed the strips. Pt is concerned if this is the right report. Will call [employer] on Thursday.

## 2013-05-26 ENCOUNTER — Encounter: Payer: Self-pay | Admitting: Cardiology

## 2013-05-28 ENCOUNTER — Encounter: Payer: Self-pay | Admitting: *Deleted

## 2013-06-22 ENCOUNTER — Encounter (HOSPITAL_COMMUNITY): Payer: Self-pay | Admitting: Emergency Medicine

## 2013-06-22 ENCOUNTER — Emergency Department (HOSPITAL_COMMUNITY)
Admission: EM | Admit: 2013-06-22 | Discharge: 2013-06-22 | Disposition: A | Discharge status: Home / Self Care | Payer: Self-pay | Attending: Emergency Medicine | Admitting: Emergency Medicine

## 2013-06-22 ENCOUNTER — Emergency Department (HOSPITAL_COMMUNITY): Payer: Self-pay

## 2013-06-22 DIAGNOSIS — S8000XA Contusion of unspecified knee, initial encounter: Secondary | ICD-10-CM | POA: Insufficient documentation

## 2013-06-22 DIAGNOSIS — I10 Essential (primary) hypertension: Secondary | ICD-10-CM | POA: Insufficient documentation

## 2013-06-22 DIAGNOSIS — Y929 Unspecified place or not applicable: Secondary | ICD-10-CM | POA: Insufficient documentation

## 2013-06-22 DIAGNOSIS — Z9981 Dependence on supplemental oxygen: Secondary | ICD-10-CM | POA: Insufficient documentation

## 2013-06-22 DIAGNOSIS — G4733 Obstructive sleep apnea (adult) (pediatric): Secondary | ICD-10-CM | POA: Insufficient documentation

## 2013-06-22 DIAGNOSIS — F329 Major depressive disorder, single episode, unspecified: Secondary | ICD-10-CM | POA: Insufficient documentation

## 2013-06-22 DIAGNOSIS — F411 Generalized anxiety disorder: Secondary | ICD-10-CM | POA: Insufficient documentation

## 2013-06-22 DIAGNOSIS — Y9389 Activity, other specified: Secondary | ICD-10-CM | POA: Insufficient documentation

## 2013-06-22 DIAGNOSIS — W64XXXA Exposure to other animate mechanical forces, initial encounter: Secondary | ICD-10-CM | POA: Insufficient documentation

## 2013-06-22 DIAGNOSIS — F3289 Other specified depressive episodes: Secondary | ICD-10-CM | POA: Insufficient documentation

## 2013-06-22 DIAGNOSIS — Z7982 Long term (current) use of aspirin: Secondary | ICD-10-CM | POA: Insufficient documentation

## 2013-06-22 DIAGNOSIS — Z791 Long term (current) use of non-steroidal anti-inflammatories (NSAID): Secondary | ICD-10-CM | POA: Insufficient documentation

## 2013-06-22 DIAGNOSIS — K219 Gastro-esophageal reflux disease without esophagitis: Secondary | ICD-10-CM | POA: Insufficient documentation

## 2013-06-22 DIAGNOSIS — I509 Heart failure, unspecified: Secondary | ICD-10-CM | POA: Insufficient documentation

## 2013-06-22 DIAGNOSIS — F17200 Nicotine dependence, unspecified, uncomplicated: Secondary | ICD-10-CM | POA: Insufficient documentation

## 2013-06-22 MED ORDER — IBUPROFEN 800 MG PO TABS: 800.0000 mg | ORAL_TABLET | Freq: Three times a day (TID) | ORAL | Status: DC

## 2013-06-22 MED ORDER — OXYCODONE-ACETAMINOPHEN 5-325 MG PO TABS: 1.0000 | ORAL_TABLET | ORAL | Status: DC | PRN

## 2013-06-22 MED ORDER — OXYCODONE-ACETAMINOPHEN 5-325 MG PO TABS
1.0000 | ORAL_TABLET | Freq: Once | ORAL | Status: AC
  Administered 2013-06-22: 1 via ORAL
  Filled 2013-06-22: qty 1

## 2013-06-22 NOTE — Discharge Instructions (Signed)
Contusion °A contusion is a deep bruise. Contusions are the result of an injury that caused bleeding under the skin. The contusion may turn blue, purple, or yellow. Minor injuries will give you a painless contusion, but more severe contusions may stay painful and swollen for a few weeks.  °CAUSES  °A contusion is usually caused by a blow, trauma, or direct force to an area of the body. °SYMPTOMS  °· Swelling and redness of the injured area. °· Bruising of the injured area. °· Tenderness and soreness of the injured area. °· Pain. °DIAGNOSIS  °The diagnosis can be made by taking a history and physical exam. An X-ray, CT scan, or MRI may be needed to determine if there were any associated injuries, such as fractures. °TREATMENT  °Specific treatment will depend on what area of the body was injured. In general, the best treatment for a contusion is resting, icing, elevating, and applying cold compresses to the injured area. Over-the-counter medicines may also be recommended for pain control. Ask your caregiver what the best treatment is for your contusion. °HOME CARE INSTRUCTIONS  °· Put ice on the injured area. °· Put ice in a plastic bag. °· Place a towel between your skin and the bag. °· Leave the ice on for 15-20 minutes, 03-04 times a day. °· Only take over-the-counter or prescription medicines for pain, discomfort, or fever as directed by your caregiver. Your caregiver may recommend avoiding anti-inflammatory medicines (aspirin, ibuprofen, and naproxen) for 48 hours because these medicines may increase bruising. °· Rest the injured area. °· If possible, elevate the injured area to reduce swelling. °SEEK IMMEDIATE MEDICAL CARE IF:  °· You have increased bruising or swelling. °· You have pain that is getting worse. °· Your swelling or pain is not relieved with medicines. °MAKE SURE YOU:  °· Understand these instructions. °· Will watch your condition. °· Will get help right away if you are not doing well or get  worse. °Document Released: 10/17/2004 Document Revised: 04/01/2011 Document Reviewed: 11/12/2010 °ExitCare® Patient Information ©2014 [employer]. ° °Cryotherapy °Cryotherapy means treatment with cold. Ice or gel packs can be used to reduce both pain and swelling. Ice is the most helpful within the first 24 to 48 hours after an injury or flareup from overusing a muscle or joint. Sprains, strains, spasms, burning pain, shooting pain, and aches can all be eased with ice. Ice can also be used when recovering from surgery. Ice is effective, has very few side effects, and is safe for most people to use. °PRECAUTIONS  °Ice is not a safe treatment option for people with: °· Raynaud's phenomenon. This is a condition affecting small blood vessels in the extremities. Exposure to cold may cause your problems to return. °· Cold hypersensitivity. There are many forms of cold hypersensitivity, including: °· Cold urticaria. Red, itchy hives appear on the skin when the tissues begin to warm after being iced. °· Cold erythema. This is a red, itchy rash caused by exposure to cold. °· Cold hemoglobinuria. Red blood cells break down when the tissues begin to warm after being iced. The hemoglobin that carry oxygen are passed into the urine because they cannot combine with blood proteins fast enough. °· Numbness or altered sensitivity in the area being iced. °If you have any of the following conditions, do not use ice until you have discussed cryotherapy with your caregiver: °· Heart conditions, such as arrhythmia, angina, or chronic heart disease. °· High blood pressure. °· Healing wounds or open skin   in the area being iced. °· Current infections. °· Rheumatoid arthritis. °· Poor circulation. °· Diabetes. °Ice slows the blood flow in the region it is applied. This is beneficial when trying to stop inflamed tissues from spreading irritating chemicals to surrounding tissues. However, if you expose your skin to cold temperatures for too  long or without the proper protection, you can damage your skin or nerves. Watch for signs of skin damage due to cold. °HOME CARE INSTRUCTIONS °Follow these tips to use ice and cold packs safely. °· Place a dry or damp towel between the ice and skin. A damp towel will cool the skin more quickly, so you may need to shorten the time that the ice is used. °· For a more rapid response, add gentle compression to the ice. °· Ice for no more than 10 to 20 minutes at a time. The bonier the area you are icing, the less time it will take to get the benefits of ice. °· Check your skin after 5 minutes to make sure there are no signs of a poor response to cold or skin damage. °· Rest 20 minutes or more in between uses. °· Once your skin is numb, you can end your treatment. You can test numbness by very lightly touching your skin. The touch should be so light that you do not see the skin dimple from the pressure of your fingertip. When using ice, most people will feel these normal sensations in this order: cold, burning, aching, and numbness. °· Do not use ice on someone who cannot communicate their responses to pain, such as small children or people with dementia. °HOW TO MAKE AN ICE PACK °Ice packs are the most common way to use ice therapy. Other methods include ice massage, ice baths, and cryo-sprays. Muscle creams that cause a cold, tingly feeling do not offer the same benefits that ice offers and should not be used as a substitute unless recommended by your caregiver. °To make an ice pack, do one of the following: °· Place crushed ice or a bag of frozen vegetables in a sealable plastic bag. Squeeze out the excess air. Place this bag inside another plastic bag. Slide the bag into a pillowcase or place a damp towel between your skin and the bag. °· Mix 3 parts water with 1 part rubbing alcohol. Freeze the mixture in a sealable plastic bag. When you remove the mixture from the freezer, it will be slushy. Squeeze out the excess  air. Place this bag inside another plastic bag. Slide the bag into a pillowcase or place a damp towel between your skin and the bag. °SEEK MEDICAL CARE IF: °· You develop white spots on your skin. This may give the skin a blotchy (mottled) appearance. °· Your skin turns blue or pale. °· Your skin becomes waxy or hard. °· Your swelling gets worse. °MAKE SURE YOU:  °· Understand these instructions. °· Will watch your condition. °· Will get help right away if you are not doing well or get worse. °Document Released: 09/03/2010 Document Revised: 04/01/2011 Document Reviewed: 09/03/2010 °ExitCare® Patient Information ©2014 [employer]. ° °

## 2013-06-22 NOTE — ED Notes (Signed)
Patient states that he was kicked by a horse yesterday at 1800 while working with the horse.  Patient was kicked in right knee.  Knee is tender to touch.  Patient with 10/10 pain while ambulating.

## 2013-06-22 NOTE — ED Provider Notes (Signed)
Medical screening examination/treatment/procedure(s) were performed by non-physician practitioner and as supervising physician I was immediately available for consultation/collaboration.    Vida Roller, MD 06/22/13 [phone]

## 2013-06-22 NOTE — Progress Notes (Signed)
Orthopedic Tech Progress Note Patient Details:  [name] 01-29-1970 [id number]  Ortho Devices Type of Ortho Device: Knee Immobilizer   Haskell Flirt 06/22/2013, 8:12 AM

## 2013-06-22 NOTE — ED Notes (Signed)
Right leg pain with no swelling or deformity. Pt states that pain pill has not touched the pain yet.

## 2013-06-22 NOTE — ED Provider Notes (Signed)
CSN: [id number]     Arrival date & time 06/22/13  0612 History   First MD Initiated Contact with Patient 06/22/13 [id number]     Chief Complaint  Patient presents with  . Knee Pain     (Consider location/radiation/quality/duration/timing/severity/associated sxs/prior Treatment) Patient is a 43 y.o. male presenting with knee pain. The history is provided by the patient. No language interpreter was used.  Knee Pain Location:  Knee Injury: yes   Knee location:  R knee Pain details:    Severity:  Moderate Chronicity:  New Dislocation: no   Foreign body present:  No foreign bodies Prior injury to area:  Yes Associated symptoms: no fever   Associated symptoms comment:  He was kicked by a horse to right knee similar to presenting injury earlier this year. No other injury. Accident occurred approximately 6:00 p.m. last evening.   Past Medical History  Diagnosis Date  . Hypertension     Lab 02/2011: Normal CBC and BMet, TSH-1.6  . History of PSVT (paroxysmal supraventricular tachycardia)     s/p ablation 2006; occasional palpitations; occasional atypical left chest pain; event recorder in 03/2011-normal sinus, sinus tachycardia, single symptomatic spell with sinus rhythm  . Hyperlipidemia     Intolerant to statins causing tongue swelling  . Obstructive sleep apnea     Wears CPAP intermittantly. [facility].  . Hyperlipidemia     Lipid profile in 2008:225, 770, 27,?; statin allergy-oral edema  . Gastroesophageal reflux disease   . Anxiety and depression     2009-suicidal ideation, involuntary commitment  . Congestive heart failure ?   Past Surgical History  Procedure Laterality Date  . Nasal turbinate reduction    . Shoulder surgery      Right  . Ankle surgery      Right   Family History  Problem Relation Age of Onset  . Heart disease Father   . Heart attack Father   . Heart disease Paternal Grandfather    History  Substance Use Topics  . Smoking  status: Current Every Day Smoker -- 1.00 packs/day for 23 years    Types: Cigarettes    Start date: 06/30/1987  . Smokeless tobacco: Never Used  . Alcohol Use: No    Review of Systems  Constitutional: Negative for fever and chills.  Musculoskeletal:       See HPI  Skin: Negative.  Negative for wound.  Neurological: Negative.  Negative for numbness.      Allergies  Simvastatin  Home Medications   Prior to Admission medications   Medication Sig Start Date End Date Taking? Authorizing Provider  aspirin EC 81 MG tablet Take 1 tablet (81 mg total) by mouth daily. 03/19/13  Yes Antoine PocheJonathan F Branch, MD  diclofenac (VOLTAREN) 75 MG EC tablet Take 75 mg by mouth 2 (two) times daily as needed for mild pain.   Yes Historical Provider, MD  esomeprazole (NEXIUM) 40 MG capsule Take 40 mg by mouth daily before breakfast.     Yes Historical Provider, MD  lamoTRIgine (LAMICTAL) 200 MG tablet Take 200 mg by mouth daily.   Yes Historical Provider, MD  metoprolol succinate (TOPROL-XL) 100 MG 24 hr tablet Take 100 mg by mouth daily. Take with or immediately following a meal.   Yes Historical Provider, MD  oxyCODONE (ROXICODONE) 15 MG immediate release tablet Take 15 mg by mouth daily as needed for pain.   Yes Historical Provider, MD   BP 130/83  Pulse 75  Temp(Src)  97.8 F (36.6 C) (Oral)  Resp 18  SpO2 95% Physical Exam  Constitutional: He is oriented to person, place, and time. He appears well-developed and well-nourished. No distress.  Cardiovascular: Intact distal pulses.   Pulmonary/Chest: Effort normal.  Musculoskeletal:  Right knee without bony deformity. Joint stable. Mild joint swelling, no discoloration. Thigh and calf non-tender.   Neurological: He is alert and oriented to person, place, and time.  Skin: Skin is warm and dry.  Psychiatric: He has a normal mood and affect.    ED Course  Procedures (including critical care time) Labs Review Labs Reviewed - No data to  display  Imaging Review Dg Knee Complete 4 Views Right  06/22/2013   CLINICAL DATA:  Right knee pain  EXAM: RIGHT KNEE - COMPLETE 4+ VIEW  COMPARISON:  None.  FINDINGS: There is no evidence of fracture, dislocation, or joint effusion. There is no evidence of arthropathy or other focal bone abnormality. Soft tissues are unremarkable.  IMPRESSION: No acute abnormality noted.   Electronically Signed   By: Alcide Clever M.D.   On: 06/22/2013 07:03     EKG Interpretation None      MDM   Final diagnoses:  None    1. Contusion, right knee  Non-fracture injury to right knee.     Arnoldo Hooker, PA-C 06/22/13 0720

## 2013-07-14 ENCOUNTER — Telehealth: Payer: Self-pay | Admitting: Cardiology

## 2013-07-14 NOTE — Telephone Encounter (Signed)
Spoke with [name] regarding his PFT's. A letter was mailed to [name] in May asking him to contact our office. I have re-scheduled his test for July 7th, 2015 @ [facility]. Patient was notified by telephone.

## 2013-07-14 NOTE — Telephone Encounter (Signed)
Patient stated that he needs his test scheduled before he can schedule his 3 month follow up with Dr Wyline MoodBranch. York SpanielSaid he has been waiting for our office to reschedule from the first time it was scheduled.

## 2013-07-27 ENCOUNTER — Encounter (HOSPITAL_COMMUNITY): Payer: Self-pay

## 2013-08-09 ENCOUNTER — Ambulatory Visit: Payer: Self-pay | Admitting: Cardiology

## 2013-08-10 ENCOUNTER — Inpatient Hospital Stay (HOSPITAL_COMMUNITY): Admission: RE | Admit: 2013-08-10 | Payer: Self-pay | Source: Ambulatory Visit

## 2013-08-17 ENCOUNTER — Encounter: Payer: Self-pay | Admitting: Cardiology

## 2013-08-17 NOTE — Progress Notes (Signed)
ERROR No Show  

## 2013-09-22 ENCOUNTER — Emergency Department (HOSPITAL_COMMUNITY)
Admission: EM | Admit: 2013-09-22 | Discharge: 2013-09-22 | Disposition: A | Discharge status: Home / Self Care | Payer: Self-pay | Attending: Emergency Medicine | Admitting: Emergency Medicine

## 2013-09-22 ENCOUNTER — Encounter (HOSPITAL_COMMUNITY): Payer: Self-pay | Admitting: Emergency Medicine

## 2013-09-22 ENCOUNTER — Emergency Department (HOSPITAL_COMMUNITY): Payer: Self-pay

## 2013-09-22 DIAGNOSIS — W268XXA Contact with other sharp object(s), not elsewhere classified, initial encounter: Secondary | ICD-10-CM | POA: Insufficient documentation

## 2013-09-22 DIAGNOSIS — K219 Gastro-esophageal reflux disease without esophagitis: Secondary | ICD-10-CM | POA: Insufficient documentation

## 2013-09-22 DIAGNOSIS — Y9389 Activity, other specified: Secondary | ICD-10-CM | POA: Insufficient documentation

## 2013-09-22 DIAGNOSIS — F3289 Other specified depressive episodes: Secondary | ICD-10-CM | POA: Insufficient documentation

## 2013-09-22 DIAGNOSIS — F17200 Nicotine dependence, unspecified, uncomplicated: Secondary | ICD-10-CM | POA: Insufficient documentation

## 2013-09-22 DIAGNOSIS — IMO0002 Reserved for concepts with insufficient information to code with codable children: Secondary | ICD-10-CM

## 2013-09-22 DIAGNOSIS — G4733 Obstructive sleep apnea (adult) (pediatric): Secondary | ICD-10-CM | POA: Insufficient documentation

## 2013-09-22 DIAGNOSIS — I10 Essential (primary) hypertension: Secondary | ICD-10-CM | POA: Insufficient documentation

## 2013-09-22 DIAGNOSIS — S61209A Unspecified open wound of unspecified finger without damage to nail, initial encounter: Secondary | ICD-10-CM | POA: Insufficient documentation

## 2013-09-22 DIAGNOSIS — I509 Heart failure, unspecified: Secondary | ICD-10-CM | POA: Insufficient documentation

## 2013-09-22 DIAGNOSIS — F411 Generalized anxiety disorder: Secondary | ICD-10-CM | POA: Insufficient documentation

## 2013-09-22 DIAGNOSIS — Z862 Personal history of diseases of the blood and blood-forming organs and certain disorders involving the immune mechanism: Secondary | ICD-10-CM | POA: Insufficient documentation

## 2013-09-22 DIAGNOSIS — Z79899 Other long term (current) drug therapy: Secondary | ICD-10-CM | POA: Insufficient documentation

## 2013-09-22 DIAGNOSIS — F329 Major depressive disorder, single episode, unspecified: Secondary | ICD-10-CM | POA: Insufficient documentation

## 2013-09-22 DIAGNOSIS — Y929 Unspecified place or not applicable: Secondary | ICD-10-CM | POA: Insufficient documentation

## 2013-09-22 DIAGNOSIS — Z8639 Personal history of other endocrine, nutritional and metabolic disease: Secondary | ICD-10-CM | POA: Insufficient documentation

## 2013-09-22 DIAGNOSIS — Z9981 Dependence on supplemental oxygen: Secondary | ICD-10-CM | POA: Insufficient documentation

## 2013-09-22 MED ORDER — IBUPROFEN 800 MG PO TABS
800.0000 mg | ORAL_TABLET | Freq: Once | ORAL | Status: AC
  Administered 2013-09-22: 800 mg via ORAL
  Filled 2013-09-22: qty 1

## 2013-09-22 MED ORDER — LIDOCAINE HCL (PF) 2 % IJ SOLN
2.0000 mL | Freq: Once | INTRAMUSCULAR | Status: AC
  Administered 2013-09-22: 2 mL
  Filled 2013-09-22: qty 10

## 2013-09-22 NOTE — Discharge Instructions (Signed)

## 2013-09-22 NOTE — ED Notes (Signed)
Pt alert & oriented x4, stable gait. Patient given discharge instructions, paperwork & prescription(s). Patient  instructed to stop at the registration desk to finish any additional paperwork. Patient verbalized understanding. Pt left department w/ no further questions. 

## 2013-09-22 NOTE — ED Notes (Signed)
Patient states that he cut his right middle finger on a hay bind today. States cut hit the bone.

## 2013-09-22 NOTE — ED Provider Notes (Signed)
CSN: [id number]     Arrival date & time 09/22/13  1717 History   First MD Initiated Contact with Patient 09/22/13 1809     Chief Complaint  Patient presents with  . Extremity Laceration     (Consider location/radiation/quality/duration/timing/severity/associated sxs/prior Treatment) The history is provided by the patient.   [name] is a 43 y.o. right-handed male presenting with laceration to his right distal volar long finger which occurred an hour before arrival.  He had a bent timing on a hay bailer that he was attempting to straighten which caused the injury.  He denies numbness distal to the injury site.  The wound bled profusely but resolved with compression.  He is up to date with his tetanus.  He denies any other injury.     Past Medical History  Diagnosis Date  . Hypertension     Lab 02/2011: Normal CBC and BMet, TSH-1.6  . History of PSVT (paroxysmal supraventricular tachycardia)     s/p ablation 2006; occasional palpitations; occasional atypical left chest pain; event recorder in 03/2011-normal sinus, sinus tachycardia, single symptomatic spell with sinus rhythm  . Hyperlipidemia     Intolerant to statins causing tongue swelling  . Obstructive sleep apnea     Wears CPAP intermittantly. Followed by Dr. [facility].  . Hyperlipidemia     Lipid profile in 2008:225, 770, 27,?; statin allergy-oral edema  . Gastroesophageal reflux disease   . Anxiety and depression     2009-suicidal ideation, involuntary commitment  . Congestive heart failure ?   Past Surgical History  Procedure Laterality Date  . Nasal turbinate reduction    . Shoulder surgery      Right  . Ankle surgery      Right   Family History  Problem Relation Age of Onset  . Heart disease Father   . Heart attack Father   . Heart disease Paternal Grandfather    History  Substance Use Topics  . Smoking status: Current Every Day Smoker -- 1.00 packs/day for 23 years    Types: Cigarettes     Start date: 06/30/1987  . Smokeless tobacco: Never Used  . Alcohol Use: No    Review of Systems  Constitutional: Negative for fever and chills.  Respiratory: Negative for shortness of breath and wheezing.   Skin: Positive for wound.  Neurological: Negative for numbness.      Allergies  Simvastatin  Home Medications   Prior to Admission medications   Medication Sig Start Date End Date Taking? Authorizing Provider  esomeprazole (NEXIUM) 40 MG capsule Take 40 mg by mouth daily before breakfast.     Yes Historical Provider, MD  lamoTRIgine (LAMICTAL) 200 MG tablet Take 200 mg by mouth daily.   Yes Historical Provider, MD  metoprolol succinate (TOPROL-XL) 100 MG 24 hr tablet Take 100 mg by mouth daily. Take with or immediately following a meal.   Yes Historical Provider, MD   BP 115/82  Pulse 90  Temp(Src) 98.3 F (36.8 C) (Oral)  Resp 16  Ht 6' (1.829 m)  Wt 225 lb (102.059 kg)  BMI 30.51 kg/m2  SpO2 99% Physical Exam  Constitutional: He is oriented to person, place, and time. He appears well-developed and well-nourished.  HENT:  Head: Normocephalic.  Cardiovascular: Normal rate.   Pulmonary/Chest: Effort normal.  Musculoskeletal: He exhibits no tenderness.  Neurological: He is alert and oriented to person, place, and time. No sensory deficit.  Skin: Laceration noted.  1 cm subcutaneous laceration right volar  distal long finger at the DIP joint, hemostatic.  Sensation is intact, less than 3 seconds distal Refill.  He displays full strength with resisted flexion and extension of the finger.    ED Course  Procedures (including critical care time)   LACERATION REPAIR Performed by: Burgess Amor Authorized by: Burgess Amor Consent: Verbal consent obtained. Risks and benefits: risks, benefits and alternatives were discussed Consent given by: patient Patient identity confirmed: provided demographic data Prepped and Draped in normal sterile fashion Wound  explored  Laceration Location: right long finger  Laceration Length: 1cm  No Foreign Bodies seen or palpated  Anesthesia: Digital block   Local anesthetic: lidocaine 2% without epinephrine  Anesthetic total: 1 ml  Irrigation method: syringe Amount of cleaning: standard  Skin closure: prolene 4-0  Number of sutures: 4  Technique: simple interrupted  Patient tolerance: Patient tolerated the procedure well with no immediate complications.  Labs Review Labs Reviewed - No data to display  Imaging Review Dg Finger Middle Right  09/22/2013   CLINICAL DATA:  Middle finger injury/laceration.  EXAM: RIGHT MIDDLE FINGER 2+V  COMPARISON:  None.  FINDINGS: There is no evidence of fracture or dislocation. There is no evidence of arthropathy or other focal bone abnormality. Anterior soft tissue defect is compatible with laceration. No radiopaque soft tissue foreign body.  IMPRESSION: No bony abnormality or evidence for radiopaque soft tissue foreign body.   Electronically Signed   By: Kennith Center M.D.   On: 09/22/2013 18:57     EKG Interpretation None      MDM   Final diagnoses:  Laceration    Wound care instructions given.  Pt advised to have sutures removed in 10 days,  Return here sooner for any signs of infection including redness, swelling, worse pain or drainage of pus.       Burgess Amor, PA-C 09/22/13 1934

## 2013-09-23 NOTE — ED Provider Notes (Signed)
Medical screening examination/treatment/procedure(s) were performed by non-physician practitioner and as supervising physician I was immediately available for consultation/collaboration.   EKG Interpretation None        Jaja Switalski, MD 09/23/13 0023 

## 2013-12-30 ENCOUNTER — Encounter (HOSPITAL_COMMUNITY): Payer: Self-pay | Admitting: Cardiovascular Disease

## 2014-02-01 ENCOUNTER — Emergency Department (HOSPITAL_COMMUNITY): Payer: Self-pay

## 2014-02-01 ENCOUNTER — Emergency Department (HOSPITAL_COMMUNITY)
Admission: EM | Admit: 2014-02-01 | Discharge: 2014-02-02 | Disposition: A | Discharge status: Home / Self Care | Payer: Self-pay | Attending: Emergency Medicine | Admitting: Emergency Medicine

## 2014-02-01 ENCOUNTER — Encounter (HOSPITAL_COMMUNITY): Payer: Self-pay | Admitting: Emergency Medicine

## 2014-02-01 DIAGNOSIS — I10 Essential (primary) hypertension: Secondary | ICD-10-CM | POA: Insufficient documentation

## 2014-02-01 DIAGNOSIS — I509 Heart failure, unspecified: Secondary | ICD-10-CM | POA: Insufficient documentation

## 2014-02-01 DIAGNOSIS — I252 Old myocardial infarction: Secondary | ICD-10-CM | POA: Insufficient documentation

## 2014-02-01 DIAGNOSIS — Z720 Tobacco use: Secondary | ICD-10-CM | POA: Insufficient documentation

## 2014-02-01 DIAGNOSIS — F419 Anxiety disorder, unspecified: Secondary | ICD-10-CM | POA: Insufficient documentation

## 2014-02-01 DIAGNOSIS — K219 Gastro-esophageal reflux disease without esophagitis: Secondary | ICD-10-CM | POA: Insufficient documentation

## 2014-02-01 DIAGNOSIS — Z79899 Other long term (current) drug therapy: Secondary | ICD-10-CM | POA: Insufficient documentation

## 2014-02-01 DIAGNOSIS — R55 Syncope and collapse: Secondary | ICD-10-CM | POA: Insufficient documentation

## 2014-02-01 DIAGNOSIS — G4733 Obstructive sleep apnea (adult) (pediatric): Secondary | ICD-10-CM | POA: Insufficient documentation

## 2014-02-01 DIAGNOSIS — Z8639 Personal history of other endocrine, nutritional and metabolic disease: Secondary | ICD-10-CM | POA: Insufficient documentation

## 2014-02-01 DIAGNOSIS — F329 Major depressive disorder, single episode, unspecified: Secondary | ICD-10-CM | POA: Insufficient documentation

## 2014-02-01 LAB — I-STAT TROPONIN, ED: Troponin i, poc: 0 ng/mL (ref 0.00–0.08)

## 2014-02-01 LAB — BASIC METABOLIC PANEL
Anion gap: 8 (ref 5–15)
BUN: 7 mg/dL (ref 6–23)
CALCIUM: 9.1 mg/dL (ref 8.4–10.5)
CO2: 24 mmol/L (ref 19–32)
CREATININE: 1.34 mg/dL (ref 0.50–1.35)
Chloride: 106 mEq/L (ref 96–112)
GFR calc Af Amer: 74 mL/min — ABNORMAL LOW (ref >90)
GFR, EST NON AFRICAN AMERICAN: 64 mL/min — AB (ref >90)
Glucose, Bld: 110 mg/dL — ABNORMAL HIGH (ref 70–99)
Potassium: 3.8 mmol/L (ref 3.5–5.1)
Sodium: 138 mmol/L (ref 135–145)

## 2014-02-01 LAB — CBC
HCT: 44.7 % (ref 39.0–52.0)
Hemoglobin: 15.9 g/dL (ref 13.0–17.0)
MCH: 31.7 pg (ref 26.0–34.0)
MCHC: 35.6 g/dL (ref 30.0–36.0)
MCV: 89 fL (ref 78.0–100.0)
PLATELETS: 272 10*3/uL (ref 150–400)
RBC: 5.02 MIL/uL (ref 4.22–5.81)
RDW: 13.8 % (ref 11.5–15.5)
WBC: 11.2 10*3/uL — AB (ref 4.0–10.5)

## 2014-02-01 LAB — D-DIMER, QUANTITATIVE: D-Dimer, Quant: 0.29 ug/mL-FEU (ref 0.00–0.48)

## 2014-02-01 NOTE — ED Provider Notes (Signed)
CSN: [id number]     Arrival date & time 02/01/14  2024 History   First MD Initiated Contact with Patient 02/01/14 2054     Chief Complaint  Patient presents with  . Chest Pain     (Consider location/radiation/quality/duration/timing/severity/associated sxs/prior Treatment) HPI 44 year old male presents with acute onset of dizziness, palpitations, left-sided sharp chest pain, and a brief syncopal episode. Occurred about 3 hours ago. The pain lasted about 30 minutes. Was relieved when he was given 3-4 mg aspirin as well as one nitroglycerin. Patient felt like his heart was racing at the time. He has a history of paroxysmal SVT, had ablated in 2006 but occasionally gets recurrence. This does feel similar to this. Patient denies a prior history of myocardial infarction. No current symptoms. No leg pain or leg swelling. No recent trips or surgeries.  Past Medical History  Diagnosis Date  . Hypertension     Lab 02/2011: Normal CBC and BMet, TSH-1.6  . History of PSVT (paroxysmal supraventricular tachycardia)     s/p ablation 2006; occasional palpitations; occasional atypical left chest pain; event recorder in 03/2011-normal sinus, sinus tachycardia, single symptomatic spell with sinus rhythm  . Hyperlipidemia     Intolerant to statins causing tongue swelling  . Obstructive sleep apnea     Wears CPAP intermittantly. Followed by Dr. Gildardo Cranker [facility].  . Hyperlipidemia     Lipid profile in 2008:225, 770, 27,?; statin allergy-oral edema  . Gastroesophageal reflux disease   . Anxiety and depression     2009-suicidal ideation, involuntary commitment  . Congestive heart failure ?  . MI (myocardial infarction) 2006   Past Surgical History  Procedure Laterality Date  . Nasal turbinate reduction    . Shoulder surgery      Right  . Ankle surgery      Right  . Left heart catheterization with coronary angiogram N/A 02/21/2011    Procedure: LEFT HEART CATHETERIZATION WITH CORONARY  ANGIOGRAM;  Surgeon: Tonny Bollman, MD;  Location: [facility] CATH LAB;  Service: Cardiovascular;  Laterality: N/A;   Family History  Problem Relation Age of Onset  . Heart disease Father   . Heart attack Father   . Heart disease Paternal Grandfather    History  Substance Use Topics  . Smoking status: Current Every Day Smoker -- 1.00 packs/day for 23 years    Types: Cigarettes    Start date: 06/30/1987  . Smokeless tobacco: Never Used  . Alcohol Use: No    Review of Systems  Constitutional: Positive for diaphoresis.  Respiratory: Negative for shortness of breath.   Cardiovascular: Positive for chest pain.  Gastrointestinal: Negative for vomiting.  Neurological: Positive for dizziness and syncope.  All other systems reviewed and are negative.     Allergies  Simvastatin  Home Medications   Prior to Admission medications   Medication Sig Start Date End Date Taking? Authorizing Provider  esomeprazole (NEXIUM) 40 MG capsule Take 40 mg by mouth daily before breakfast.      Historical Provider, MD  lamoTRIgine (LAMICTAL) 200 MG tablet Take 200 mg by mouth daily.    Historical Provider, MD  metoprolol succinate (TOPROL-XL) 100 MG 24 hr tablet Take 100 mg by mouth daily. Take with or immediately following a meal.    Historical Provider, MD   BP 144/85 mmHg  Pulse 86  Temp(Src) 98.5 F (36.9 C) (Oral)  Resp 25  Ht 6' (1.829 m)  Wt 230 lb (104.327 kg)  BMI 31.19 kg/m2  SpO2 94% Physical Exam  Constitutional: He is oriented to person, place, and time. He appears well-developed and well-nourished.  HENT:  Head: Normocephalic and atraumatic.  Right Ear: External ear normal.  Left Ear: External ear normal.  Nose: Nose normal.  Eyes: Right eye exhibits no discharge. Left eye exhibits no discharge.  Neck: Neck supple.  Cardiovascular: Normal rate, regular rhythm, normal heart sounds and intact distal pulses.   Pulmonary/Chest: Effort normal. He exhibits no tenderness.  Abdominal:  Soft. He exhibits no distension. There is no tenderness.  Musculoskeletal: He exhibits no edema or tenderness.  Neurological: He is alert and oriented to person, place, and time.  Skin: Skin is warm and dry.  Nursing note and vitals reviewed.   ED Course  Procedures (including critical care time) Labs Review Labs Reviewed  CBC - Abnormal; Notable for the following:    WBC 11.2 (*)    All other components within normal limits  BASIC METABOLIC PANEL - Abnormal; Notable for the following:    Glucose, Bld 110 (*)    GFR calc non Af Amer 64 (*)    GFR calc Af Amer 74 (*)    All other components within normal limits  D-DIMER, QUANTITATIVE  I-STAT TROPOININ, ED  I-STAT TROPOININ, ED    Imaging Review Dg Chest Port 1 View  02/01/2014   CLINICAL DATA:  Chest pain for 1 day, history hypertension, PSVT, congestive heart failure, MI, smoker  EXAM: PORTABLE CHEST - 1 VIEW  COMPARISON:  Portable exam 2058 hr compared to 03/07/2012  FINDINGS: Normal heart size, mediastinal contours, and pulmonary vascularity.  Lungs clear.  No pleural effusion or pneumothorax.  Old fractures of the posterior lateral LEFT fifth, sixth, and seventh ribs.  Bones otherwise unremarkable.  IMPRESSION: No acute abnormalities.   Electronically Signed   By: Ulyses SouthwardMark  Boles M.D.   On: 02/01/2014 21:23     EKG Interpretation   Date/Time:  Tuesday February 01 2014 20:31:46 EST Ventricular Rate:  88 PR Interval:  144 QRS Duration: 110 QT Interval:  373 QTC Calculation: 451 R Axis:   29 Text Interpretation:  Normal sinus rhythm no acute ST/T changes no  significant changes since 2013 Confirmed by Teller Wakefield  MD, Riel Hirschman ([id number]) on  02/01/2014 8:52:49 PM      MDM   Final diagnoses:  Syncope and collapse    Patient symptoms are similar to prior PSVT symptoms he's had the past, including the syncope and chest pain. He has a benign EKG and initial troponin that is normal. Given the atypical chest pain with syncope a d-dimer  was sent as well as he is low risk for PE. This is negative. I discussed this case with Dr. Adolm JosephWhitlock of cardiology, who is reviewed his chart in case he recommends a delta troponin given a normal echo this year as well as a clean cardiac catheterization 2 years ago. Discussed this with the patient, if second troponin is negative patient will be discharged to follow up with cardiologist for further testing and monitoring of his arrhythmia.    Audree CamelScott T Lashara Urey, MD 02/02/14 [phone]

## 2014-02-01 NOTE — ED Notes (Signed)
Per EMS, pt had a sudden onset of left sided chest pain with radiation to shoulder and neck around 1930 this evening, rating the pain at an 8/10. Pt's family also reports that he had a brief syncopal episode, not witnessed by EMS. Pt received 324 ASA en route, as well as 1 NTG with relief. Pt with hx of MI with stent placement.

## 2014-02-02 LAB — I-STAT TROPONIN, ED: TROPONIN I, POC: 0 ng/mL (ref 0.00–0.08)

## 2014-02-03 ENCOUNTER — Encounter: Payer: Self-pay | Admitting: Cardiology

## 2014-02-03 ENCOUNTER — Ambulatory Visit (INDEPENDENT_AMBULATORY_CARE_PROVIDER_SITE_OTHER): Payer: PRIVATE HEALTH INSURANCE | Admitting: Cardiology

## 2014-02-03 VITALS — BP 124/85 | HR 73 | Ht 72.0 in | Wt 241.0 lb

## 2014-02-03 DIAGNOSIS — Z8679 Personal history of other diseases of the circulatory system: Secondary | ICD-10-CM

## 2014-02-03 DIAGNOSIS — R42 Dizziness and giddiness: Secondary | ICD-10-CM

## 2014-02-03 DIAGNOSIS — R002 Palpitations: Secondary | ICD-10-CM

## 2014-02-03 MED ORDER — MECLIZINE HCL 25 MG PO TABS
25.0000 mg | ORAL_TABLET | Freq: Three times a day (TID) | ORAL | Status: DC | PRN
Start: 2014-02-03 — End: 2014-04-28

## 2014-02-03 NOTE — Progress Notes (Addendum)
Clinical Summary [name] is a 44 y.o.male seen today for follow up of the following medical problems.  1. Palpitations/hx of psvt w/ ablation  - 05/2013 7 day monitor with symptoms, no arrhythmias. Patient reports problem with monitor - episode 02/01/14 of palpitations, seen in [facility] - sitting in recliner. Felt heart racing, + nausea. Stood up at that time, and then syncope. Out for just a short period of time. No chest pain. Somewhat similar to prior episodes. Last episode 1 year ago. Denies any significant EtoH or caffeine. Compliant with beta blocker 50mg  bid. Still with continued dizziness that is nearly constant since that episode, can occur with laying down, sitting, or standing.     Past Medical History  Diagnosis Date  . Hypertension     Lab 02/2011: Normal CBC and BMet, TSH-1.6  . History of PSVT (paroxysmal supraventricular tachycardia)     s/p ablation 2006; occasional palpitations; occasional atypical left chest pain; event recorder in 03/2011-normal sinus, sinus tachycardia, single symptomatic spell with sinus rhythm  . Hyperlipidemia     Intolerant to statins causing tongue swelling  . Obstructive sleep apnea     Wears CPAP intermittantly. [facility].  . Hyperlipidemia     Lipid profile in 2008:225, 770, 27,?; statin allergy-oral edema  . Gastroesophageal reflux disease   . Anxiety and depression     2009-suicidal ideation, involuntary commitment  . Congestive heart failure ?  . MI (myocardial infarction) 2006     Allergies  Allergen Reactions  . Simvastatin Rash    "I get real hot"     Current Outpatient Prescriptions  Medication Sig Dispense Refill  . esomeprazole (NEXIUM) 40 MG capsule Take 40 mg by mouth daily before breakfast.      . lamoTRIgine (LAMICTAL) 200 MG tablet Take 200 mg by mouth daily.    . metoprolol succinate (TOPROL-XL) 100 MG 24 hr tablet Take 100 mg by mouth daily. Take with or immediately  following a meal.     No current facility-administered medications for this visit.     Past Surgical History  Procedure Laterality Date  . Nasal turbinate reduction    . Shoulder surgery      Right  . Ankle surgery      Right  . Left heart catheterization with coronary angiogram N/A 02/21/2011    Procedure: LEFT HEART CATHETERIZATION WITH CORONARY ANGIOGRAM;  Surgeon: Tonny BollmanMichael Cooper, MD;  [facility] LAB;  Service: Cardiovascular;  Laterality: N/A;     Allergies  Allergen Reactions  . Simvastatin Rash    "I get real hot"      Family History  Problem Relation Age of Onset  . Heart disease Father   . Heart attack Father   . Heart disease Paternal Grandfather      Social History [name] reports that he has been smoking Cigarettes.  He started smoking about 26 years ago. He has a 23 pack-year smoking history. He has never used smokeless tobacco. [name] reports that he does not drink alcohol.   Review of Systems CONSTITUTIONAL: No weight loss, fever, chills, weakness or fatigue.  HEENT: Eyes: No visual loss, blurred vision, double vision or yellow sclerae.No hearing loss, sneezing, congestion, runny nose or sore throat.  SKIN: No rash or itching.  CARDIOVASCULAR: per HPI RESPIRATORY: No shortness of breath, cough or sputum.  GASTROINTESTINAL: No anorexia, nausea, vomiting or diarrhea. No abdominal pain or blood.  GENITOURINARY: No burning  on urination, no polyuria NEUROLOGICAL: No headache, dizziness, syncope, paralysis, ataxia, numbness or tingling in the extremities. No change in bowel or bladder control.  MUSCULOSKELETAL: No muscle, back pain, joint pain or stiffness.  LYMPHATICS: No enlarged nodes. No history of splenectomy.  PSYCHIATRIC: No history of depression or anxiety.  ENDOCRINOLOGIC: No reports of sweating, cold or heat intolerance. No polyuria or polydipsia.  Marland Kitchen   Physical Examination p 73 bp 124/85 Wt 241 lbs bMI 33 Gen: resting comfortably,  no acute distress HEENT: no scleral icterus, pupils equal round and reactive, no palptable cervical adenopathy,  CV: RRR, no m/r/g, no JVD, no carotid bruits Resp: Clear to auscultation bilaterally GI: abdomen is soft, non-tender, non-distended, normal bowel sounds, no hepatosplenomegaly MSK: extremities are warm, no edema.  Skin: warm, no rash Neuro:  no focal deficits Psych: appropriate affect   Diagnostic Studies 03/2013 Echo  LVEF 55-60%, mjild LVH, normal diastolic function   02/2013 Lexiscan MPI  No ischemia  05/2013 Event Monitor No symptoms, no arrhythmias  Jan 2006 Cath FINDINGS: 1. LV: 100/10/16. EF 38% with diffuse hypokinesis. 2. No aortic stenosis or mitral regurgitation. 3. Left main: Angiographically normal. 4. LAD: Large vessel giving rise to a single large diagonal. It is  angiographically normal. 5. Circumflex: Relatively large vessel giving rise to two large marginals.  It is angiographically normal. 6. RCA: Large, dominant vessel. It is angiographically normal.  IMPRESSION/RECOMMENDATIONS: 1. Angiographically normal coronary arteries. 2. No aortic stenosis or mitral regurgitation. 3. Ejection fraction 38%, likely secondary to tachycardia.  The patient will be discharged home today on beta blockers for his ST-T. 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 We will have him follow-up with Dr. Graciela Husbands for consideration of ablation.  Assessment and Plan  1. Palpitations  - hx of prior psvt with prior ablation  - has had palpitations on and off for sometime. Recent severe episode earlier this month seen in [facility] ER. EKG at that time showed NSR. Recent monitor with no arryhythmias. Reports first episode in nearly a year. Will continue current meds, if recurrence titrate up beta blocker at that time.  - continue beta blocker  2. Dizziness - constant since episode of palpitations. With dizziness in our  office today he has a normal EKG and negative orthostatics - history not consistent with cardiogenic cause. Given Rx for prn meclizine and asked to follow up with pcp    F/u 1 year  Antoine Poche, M.D    06/08/14 Addendum Patient being considered for knee surgery. Recent normal stress test and echo, recommend proceeding with surgery as planned. Patient with history of PSVT with occasional symptoms but no documented recurrence by monitor. Should be followed by routine cardiac telemetry monitoring during surgery     Dominga Ferry MD

## 2014-02-03 NOTE — Patient Instructions (Signed)
   Meclizine 25mg  - may take every 8 hours as needed for dizziness - new sent to pharm Continue all other medications.   Your physician wants you to follow up in:  1 year.  You will receive a reminder letter in the mail one-two months in advance.  If you don't receive a letter, please call our office to schedule the follow up appointment

## 2014-02-09 ENCOUNTER — Emergency Department (HOSPITAL_COMMUNITY)
Admission: EM | Admit: 2014-02-09 | Discharge: 2014-02-09 | Disposition: A | Discharge status: Home / Self Care | Payer: PRIVATE HEALTH INSURANCE | Attending: Emergency Medicine | Admitting: Emergency Medicine

## 2014-02-09 ENCOUNTER — Encounter (HOSPITAL_COMMUNITY): Payer: Self-pay | Admitting: *Deleted

## 2014-02-09 DIAGNOSIS — I509 Heart failure, unspecified: Secondary | ICD-10-CM | POA: Insufficient documentation

## 2014-02-09 DIAGNOSIS — I252 Old myocardial infarction: Secondary | ICD-10-CM | POA: Diagnosis not present

## 2014-02-09 DIAGNOSIS — Z79899 Other long term (current) drug therapy: Secondary | ICD-10-CM | POA: Insufficient documentation

## 2014-02-09 DIAGNOSIS — Z8639 Personal history of other endocrine, nutritional and metabolic disease: Secondary | ICD-10-CM | POA: Diagnosis not present

## 2014-02-09 DIAGNOSIS — I10 Essential (primary) hypertension: Secondary | ICD-10-CM | POA: Insufficient documentation

## 2014-02-09 DIAGNOSIS — G4733 Obstructive sleep apnea (adult) (pediatric): Secondary | ICD-10-CM | POA: Insufficient documentation

## 2014-02-09 DIAGNOSIS — F419 Anxiety disorder, unspecified: Secondary | ICD-10-CM | POA: Diagnosis not present

## 2014-02-09 DIAGNOSIS — N410 Acute prostatitis: Secondary | ICD-10-CM | POA: Diagnosis not present

## 2014-02-09 DIAGNOSIS — Z9981 Dependence on supplemental oxygen: Secondary | ICD-10-CM | POA: Insufficient documentation

## 2014-02-09 DIAGNOSIS — R339 Retention of urine, unspecified: Secondary | ICD-10-CM | POA: Diagnosis present

## 2014-02-09 DIAGNOSIS — Z720 Tobacco use: Secondary | ICD-10-CM | POA: Insufficient documentation

## 2014-02-09 DIAGNOSIS — K219 Gastro-esophageal reflux disease without esophagitis: Secondary | ICD-10-CM | POA: Insufficient documentation

## 2014-02-09 LAB — URINALYSIS, ROUTINE W REFLEX MICROSCOPIC
Bilirubin Urine: NEGATIVE
Glucose, UA: NEGATIVE mg/dL
Ketones, ur: NEGATIVE mg/dL
NITRITE: NEGATIVE
PROTEIN: NEGATIVE mg/dL
Specific Gravity, Urine: 1.01 (ref 1.005–1.030)
Urobilinogen, UA: 0.2 mg/dL (ref 0.0–1.0)
pH: 5.5 (ref 5.0–8.0)

## 2014-02-09 LAB — CBC WITH DIFFERENTIAL/PLATELET
Basophils Absolute: 0 10*3/uL (ref 0.0–0.1)
Basophils Relative: 0 % (ref 0–1)
EOS PCT: 2 % (ref 0–5)
Eosinophils Absolute: 0.2 10*3/uL (ref 0.0–0.7)
HEMATOCRIT: 47.7 % (ref 39.0–52.0)
Hemoglobin: 16.3 g/dL (ref 13.0–17.0)
LYMPHS PCT: 37 % (ref 12–46)
Lymphs Abs: 3.6 10*3/uL (ref 0.7–4.0)
MCH: 30.9 pg (ref 26.0–34.0)
MCHC: 34.2 g/dL (ref 30.0–36.0)
MCV: 90.5 fL (ref 78.0–100.0)
MONO ABS: 0.9 10*3/uL (ref 0.1–1.0)
Monocytes Relative: 9 % (ref 3–12)
Neutro Abs: 5.1 10*3/uL (ref 1.7–7.7)
Neutrophils Relative %: 52 % (ref 43–77)
PLATELETS: 277 10*3/uL (ref 150–400)
RBC: 5.27 MIL/uL (ref 4.22–5.81)
RDW: 13.6 % (ref 11.5–15.5)
WBC: 9.9 10*3/uL (ref 4.0–10.5)

## 2014-02-09 LAB — BASIC METABOLIC PANEL
Anion gap: 7 (ref 5–15)
BUN: 8 mg/dL (ref 6–23)
CHLORIDE: 106 meq/L (ref 96–112)
CO2: 27 mmol/L (ref 19–32)
CREATININE: 1.11 mg/dL (ref 0.50–1.35)
Calcium: 9.3 mg/dL (ref 8.4–10.5)
GFR calc non Af Amer: 80 mL/min — ABNORMAL LOW (ref >90)
GLUCOSE: 81 mg/dL (ref 70–99)
POTASSIUM: 3.7 mmol/L (ref 3.5–5.1)
Sodium: 140 mmol/L (ref 135–145)

## 2014-02-09 LAB — URINE MICROSCOPIC-ADD ON

## 2014-02-09 MED ORDER — OXYCODONE-ACETAMINOPHEN 5-325 MG PO TABS
2.0000 | ORAL_TABLET | Freq: Once | ORAL | Status: AC
  Administered 2014-02-09: 2 via ORAL
  Filled 2014-02-09: qty 2

## 2014-02-09 MED ORDER — TAMSULOSIN HCL 0.4 MG PO CAPS: 0.4000 mg | ORAL_CAPSULE | Freq: Every day | ORAL | Status: DC

## 2014-02-09 MED ORDER — TAMSULOSIN HCL 0.4 MG PO CAPS
0.4000 mg | ORAL_CAPSULE | Freq: Once | ORAL | Status: AC
  Administered 2014-02-09: 0.4 mg via ORAL
  Filled 2014-02-09: qty 1

## 2014-02-09 MED ORDER — OXYCODONE-ACETAMINOPHEN 5-325 MG PO TABS: 2.0000 | ORAL_TABLET | ORAL | Status: DC | PRN

## 2014-02-09 NOTE — ED Notes (Addendum)
Pt states he is unable to urinate. States his prostate is enlarged and Dr. Cecelia Byarsiego was concerned that he may have kidney stones.Pt states severe pain when attempting to urinate. Last urinated a small amount 2 hours ago.

## 2014-02-09 NOTE — Discharge Instructions (Signed)
Prostatitis Prostatitis is redness, soreness, and puffiness (swelling) of the prostate gland. The prostate gland is the walnut-sized gland located just below your bladder. HOME CARE:   Take all medicines as told by your doctor.  Take warm-water baths (sitz baths) as told by your doctor. GET HELP IF:  Your symptoms get worse, not better.  You have a fever. GET HELP RIGHT AWAY IF:   You have chills.  You feel sick to your stomach (nauseous) or like you will throw up (vomit).  You feel lightheaded or like you will pass out (faint).  You are unable to pee (urinate).  You have blood or blood clumps (clots) in your pee (urine). MAKE SURE YOU:  Understand these instructions.  Will watch your condition.  Will get help right away if you are not doing well or get worse. Document Released: 07/09/2011 Document Revised: 09/09/2012 Document Reviewed: 07/27/2012 [facility] Patient Information 2015 [place], [place]. This information is not intended to replace advice given to you by your health care provider. Make sure you discuss any questions you have with your health care provider.   Sit in hot bathtub. Continue antibiotics. Prescription for pain medicine and medicine to increase urinary flow.

## 2014-02-09 NOTE — ED Notes (Signed)
Patient drinking and eating at this time.

## 2014-02-09 NOTE — ED Notes (Signed)
Pt was given a prepackage of Percocet 5-325 mg, quantity six and given verbal instructions on use.

## 2014-02-09 NOTE — ED Notes (Signed)
Pt reports needs to void. Urinal provided.

## 2014-02-09 NOTE — ED Provider Notes (Signed)
CSN: [id number]     Arrival date & time 02/09/14  1649 History   First MD Initiated Contact with Patient 02/09/14 1817     Chief Complaint  Patient presents with  . Urinary Retention     (Consider location/radiation/quality/duration/timing/severity/associated sxs/prior Treatment) HPI.... Dysuria, frequency, urinating small amounts, questionable fever for 2 days. Patient was seen by primary care physician today and given Cipro for suspected ? prostatitis. Complains of pain in suprapubic area. No flank pain or chills. Severity is moderate. Nothing makes symptoms better or worse.  Past Medical History  Diagnosis Date  . Hypertension     Lab 02/2011: Normal CBC and BMet, TSH-1.6  . History of PSVT (paroxysmal supraventricular tachycardia)     s/p ablation 2006; occasional palpitations; occasional atypical left chest pain; event recorder in 03/2011-normal sinus, sinus tachycardia, single symptomatic spell with sinus rhythm  . Hyperlipidemia     Intolerant to statins causing tongue swelling  . Obstructive sleep apnea     Wears CPAP intermittantly. Followed by Dr. Gildardo Cranker [facility].  . Hyperlipidemia     Lipid profile in 2008:225, 770, 27,?; statin allergy-oral edema  . Gastroesophageal reflux disease   . Anxiety and depression     2009-suicidal ideation, involuntary commitment  . Congestive heart failure ?  . MI (myocardial infarction) 2006   Past Surgical History  Procedure Laterality Date  . Nasal turbinate reduction    . Shoulder surgery      Right  . Ankle surgery      Right  . Left heart catheterization with coronary angiogram N/A 02/21/2011    Procedure: LEFT HEART CATHETERIZATION WITH CORONARY ANGIOGRAM;  Surgeon: Tonny Bollman, MD;  Location: [facility] CATH LAB;  Service: Cardiovascular;  Laterality: N/A;   Family History  Problem Relation Age of Onset  . Heart disease Father   . Heart attack Father   . Heart disease Paternal Grandfather    History  Substance Use  Topics  . Smoking status: Current Every Day Smoker -- 1.00 packs/day for 23 years    Types: Cigarettes    Start date: 06/30/1987  . Smokeless tobacco: Never Used  . Alcohol Use: No    Review of Systems  All other systems reviewed and are negative.     Allergies  Simvastatin  Home Medications   Prior to Admission medications   Medication Sig Start Date End Date Taking? Authorizing Provider  esomeprazole (NEXIUM) 40 MG capsule Take 40 mg by mouth daily before breakfast.      Historical Provider, MD  lamoTRIgine (LAMICTAL) 200 MG tablet Take 200 mg by mouth daily.    Historical Provider, MD  meclizine (ANTIVERT) 25 MG tablet Take 1 tablet (25 mg total) by mouth 3 (three) times daily as needed for dizziness. 02/03/14   Antoine Poche, MD  metoprolol succinate (TOPROL-XL) 100 MG 24 hr tablet Take 100 mg by mouth daily. Take with or immediately following a meal.    Historical Provider, MD  oxyCODONE-acetaminophen (PERCOCET) 5-325 MG per tablet Take 2 tablets by mouth every 4 (four) hours as needed. 02/09/14   Donnetta Hutching, MD  oxyCODONE-acetaminophen (PERCOCET) 5-325 MG per tablet Take 2 tablets by mouth every 4 (four) hours as needed. 02/09/14   Donnetta Hutching, MD  tamsulosin (FLOMAX) 0.4 MG CAPS capsule Take 1 capsule (0.4 mg total) by mouth daily. 02/09/14   Donnetta Hutching, MD   BP 110/75 mmHg  Pulse 78  Temp(Src) 98.7 F (37.1 C) (Oral)  Resp 20  Ht 6' (  1.829 m)  Wt 235 lb (106.595 kg)  BMI 31.86 kg/m2  SpO2 95% Physical Exam  Constitutional: He is oriented to person, place, and time. He appears well-developed and well-nourished.  HENT:  Head: Normocephalic and atraumatic.  Eyes: Conjunctivae and EOM are normal. Pupils are equal, round, and reactive to light.  Neck: Normal range of motion. Neck supple.  Cardiovascular: Normal rate and regular rhythm.   Pulmonary/Chest: Effort normal and breath sounds normal.  Abdominal:  Slight suprapubic tenderness.  Genitourinary:  No flank  tenderness.  Musculoskeletal: Normal range of motion.  Neurological: He is alert and oriented to person, place, and time.  Skin: Skin is warm and dry.  Psychiatric: He has a normal mood and affect. His behavior is normal.  Nursing note and vitals reviewed.   ED Course  Procedures (including critical care time) Labs Review Labs Reviewed  BASIC METABOLIC PANEL - Abnormal; Notable for the following:    GFR calc non Af Amer 80 (*)    All other components within normal limits  URINALYSIS, ROUTINE W REFLEX MICROSCOPIC - Abnormal; Notable for the following:    Hgb urine dipstick TRACE (*)    Leukocytes, UA SMALL (*)    All other components within normal limits  URINE CULTURE  CBC WITH DIFFERENTIAL  URINE MICROSCOPIC-ADD ON    Imaging Review No results found.   EKG Interpretation None      MDM   Final diagnoses:  Acute prostatitis    RN reports residual urine of 151 mL.   Suspect prostatitis. Will culture urine. Continue Cipro. Rx Flomax 0.4 mg. Percocet for pain. No acute abdomen.    Donnetta HutchingBrian Iliana Hutt, MD 02/09/14 2049

## 2014-02-11 LAB — URINE CULTURE
Colony Count: NO GROWTH
Culture: NO GROWTH

## 2014-02-15 MED FILL — Oxycodone w/ Acetaminophen Tab 5-325 MG: ORAL | Qty: 6 | Status: AC

## 2014-04-15 ENCOUNTER — Emergency Department (HOSPITAL_COMMUNITY)
Admission: EM | Admit: 2014-04-15 | Discharge: 2014-04-15 | Disposition: A | Discharge status: Home / Self Care | Payer: PRIVATE HEALTH INSURANCE | Attending: Emergency Medicine | Admitting: Emergency Medicine

## 2014-04-15 ENCOUNTER — Emergency Department (HOSPITAL_COMMUNITY): Payer: PRIVATE HEALTH INSURANCE

## 2014-04-15 ENCOUNTER — Encounter (HOSPITAL_COMMUNITY): Payer: Self-pay | Admitting: *Deleted

## 2014-04-15 DIAGNOSIS — F419 Anxiety disorder, unspecified: Secondary | ICD-10-CM | POA: Insufficient documentation

## 2014-04-15 DIAGNOSIS — I509 Heart failure, unspecified: Secondary | ICD-10-CM | POA: Insufficient documentation

## 2014-04-15 DIAGNOSIS — W208XXA Other cause of strike by thrown, projected or falling object, initial encounter: Secondary | ICD-10-CM | POA: Diagnosis not present

## 2014-04-15 DIAGNOSIS — I471 Supraventricular tachycardia: Secondary | ICD-10-CM | POA: Insufficient documentation

## 2014-04-15 DIAGNOSIS — I10 Essential (primary) hypertension: Secondary | ICD-10-CM | POA: Diagnosis not present

## 2014-04-15 DIAGNOSIS — Y9289 Other specified places as the place of occurrence of the external cause: Secondary | ICD-10-CM | POA: Diagnosis not present

## 2014-04-15 DIAGNOSIS — Y9389 Activity, other specified: Secondary | ICD-10-CM | POA: Insufficient documentation

## 2014-04-15 DIAGNOSIS — Z720 Tobacco use: Secondary | ICD-10-CM | POA: Insufficient documentation

## 2014-04-15 DIAGNOSIS — S99921A Unspecified injury of right foot, initial encounter: Secondary | ICD-10-CM | POA: Diagnosis present

## 2014-04-15 DIAGNOSIS — Z9981 Dependence on supplemental oxygen: Secondary | ICD-10-CM | POA: Insufficient documentation

## 2014-04-15 DIAGNOSIS — F329 Major depressive disorder, single episode, unspecified: Secondary | ICD-10-CM | POA: Insufficient documentation

## 2014-04-15 DIAGNOSIS — Z79899 Other long term (current) drug therapy: Secondary | ICD-10-CM | POA: Diagnosis not present

## 2014-04-15 DIAGNOSIS — K219 Gastro-esophageal reflux disease without esophagitis: Secondary | ICD-10-CM | POA: Insufficient documentation

## 2014-04-15 DIAGNOSIS — I2510 Atherosclerotic heart disease of native coronary artery without angina pectoris: Secondary | ICD-10-CM | POA: Diagnosis not present

## 2014-04-15 DIAGNOSIS — Y998 Other external cause status: Secondary | ICD-10-CM | POA: Insufficient documentation

## 2014-04-15 DIAGNOSIS — Z9889 Other specified postprocedural states: Secondary | ICD-10-CM | POA: Diagnosis not present

## 2014-04-15 DIAGNOSIS — S9031XA Contusion of right foot, initial encounter: Secondary | ICD-10-CM

## 2014-04-15 DIAGNOSIS — Z8639 Personal history of other endocrine, nutritional and metabolic disease: Secondary | ICD-10-CM | POA: Insufficient documentation

## 2014-04-15 DIAGNOSIS — G4733 Obstructive sleep apnea (adult) (pediatric): Secondary | ICD-10-CM | POA: Diagnosis not present

## 2014-04-15 HISTORY — DX: Atherosclerotic heart disease of native coronary artery without angina pectoris: I25.10

## 2014-04-15 MED ORDER — HYDROCODONE-ACETAMINOPHEN 7.5-325 MG PO TABS: 1.0000 | ORAL_TABLET | ORAL | Status: DC | PRN

## 2014-04-15 NOTE — ED Notes (Signed)
Pain rt foot , dropped a board on it today.

## 2014-04-15 NOTE — Discharge Instructions (Signed)
Your x-ray is negative for fracture or dislocation.  Foot Contusion  A foot contusion is a deep bruise to the foot. Contusions happen when an injury causes bleeding under the skin. Signs of bruising include pain, puffiness (swelling), and discolored skin. The contusion may turn blue, purple, or yellow. HOME CARE  Put ice on the injured area.  Put ice in a plastic bag.  Place a towel between your skin and the bag.  Leave the ice on for 15-20 minutes, 03-04 times a day.  Only take medicines as told by your doctor.  Use an elastic wrap only as told. You may remove the wrap for sleeping, showering, and bathing. Take the wrap off if you lose feeling (numb) in your toes, or they turn blue or cold. Put the wrap on more loosely.  Keep the foot raised (elevated) with pillows.  If your foot hurts, avoid standing or walking.  When your doctor says it is okay to use your foot, start using it slowly. If you have pain, lessen how much you use your foot.  See your doctor as told. GET HELP RIGHT AWAY IF:   You have more redness, puffiness, or pain in your foot.  Your puffiness or pain does not get better with medicine.  You lose feeling in your foot, or you cannot move your toes.  Your foot turns cold or blue.  You have pain when you move your toes.  Your foot feels warm.  Your contusion does not get better in 2 days. MAKE SURE YOU:   Understand these instructions.  Will watch this condition.  Will get help right away if you or your child is not doing well or gets worse. Document Released: 10/17/2007 Document Revised: 07/09/2011 Document Reviewed: 12/11/2010 [facility] Patient Information 2015 [place], [place]. This information is not intended to replace advice given to you by your health care provider. Make sure you discuss any questions you have with your health care provider. Please elevate your foot above the level of your waist. Please apply ice. Please use your crutches until you  can safely apply weight to your foot.

## 2014-04-15 NOTE — ED Notes (Signed)
Pt alert & oriented x4, stable gait. Patient given discharge instructions, paperwork & prescription(s). Patient  instructed to stop at the registration desk to finish any additional paperwork. Patient verbalized understanding. Pt left department w/ no further questions. 

## 2014-04-15 NOTE — ED Provider Notes (Signed)
CSN: [id number]     Arrival date & time 04/15/14  1144 History   First MD Initiated Contact with Patient 04/15/14 1226     Chief Complaint  Patient presents with  . Foot Pain     (Consider location/radiation/quality/duration/timing/severity/associated sxs/prior Treatment) HPI Comments: Patient is a 44 year old male who presents to the emergency department with a complaint of right foot pain.  The patient states that at about 11:00 this morning he had a 14 foot piece of lumbar to fall on his right foot. The patient states he applied ice, but has had pain since that time. The patient denies being on any anticoagulation medications. He has had surgery several years ago on his right heel, but no other surgeries reported.  Patient is a 44 y.o. male presenting with lower extremity pain. The history is provided by the patient.  Foot Pain This is a new problem. The current episode started today. The problem occurs constantly. The problem has been gradually worsening. Associated symptoms include arthralgias. Pertinent negatives include no abdominal pain, chest pain, coughing or neck pain.    Past Medical History  Diagnosis Date  . Hypertension     Lab 02/2011: Normal CBC and BMet, TSH-1.6  . History of PSVT (paroxysmal supraventricular tachycardia)     s/p ablation 2006; occasional palpitations; occasional atypical left chest pain; event recorder in 03/2011-normal sinus, sinus tachycardia, single symptomatic spell with sinus rhythm  . Hyperlipidemia     Intolerant to statins causing tongue swelling  . Obstructive sleep apnea     Wears CPAP intermittantly. Followed by Dr. Gildardo Cranker Pulmonology.  . Hyperlipidemia     Lipid profile in 2008:225, 770, 27,?; statin allergy-oral edema  . Gastroesophageal reflux disease   . Anxiety and depression     2009-suicidal ideation, involuntary commitment  . Congestive heart failure ?  . Coronary artery disease    Past Surgical History  Procedure  Laterality Date  . Nasal turbinate reduction    . Shoulder surgery      Right  . Ankle surgery      Right  . Left heart catheterization with coronary angiogram N/A 02/21/2011    Procedure: LEFT HEART CATHETERIZATION WITH CORONARY ANGIOGRAM;  Surgeon: Tonny Bollman, MD;  Location: [facility] CATH LAB;  Service: Cardiovascular;  Laterality: N/A;   Family History  Problem Relation Age of Onset  . Heart disease Father   . Heart attack Father   . Heart disease Paternal Grandfather    History  Substance Use Topics  . Smoking status: Current Every Day Smoker -- 1.00 packs/day for 23 years    Types: Cigarettes    Start date: 06/30/1987  . Smokeless tobacco: Never Used  . Alcohol Use: No    Review of Systems  Constitutional: Negative for activity change.       All ROS Neg except as noted in HPI  HENT: Negative for nosebleeds.   Eyes: Negative for photophobia and discharge.  Respiratory: Negative for cough, shortness of breath and wheezing.   Cardiovascular: Negative for chest pain and palpitations.  Gastrointestinal: Negative for abdominal pain and blood in stool.  Genitourinary: Negative for dysuria, frequency and hematuria.  Musculoskeletal: Positive for arthralgias. Negative for back pain and neck pain.  Skin: Negative.   Neurological: Negative for dizziness, seizures and speech difficulty.  Psychiatric/Behavioral: Negative for hallucinations and confusion. The patient is nervous/anxious.       Allergies  Simvastatin  Home Medications   Prior to Admission medications   Medication Sig  Start Date End Date Taking? Authorizing Provider  esomeprazole (NEXIUM) 40 MG capsule Take 40 mg by mouth daily before breakfast.      Historical Provider, MD  lamoTRIgine (LAMICTAL) 200 MG tablet Take 200 mg by mouth daily.    Historical Provider, MD  meclizine (ANTIVERT) 25 MG tablet Take 1 tablet (25 mg total) by mouth 3 (three) times daily as needed for dizziness. 02/03/14   Antoine Poche, MD    metoprolol succinate (TOPROL-XL) 100 MG 24 hr tablet Take 100 mg by mouth daily. Take with or immediately following a meal.    Historical Provider, MD  oxyCODONE-acetaminophen (PERCOCET) 5-325 MG per tablet Take 2 tablets by mouth every 4 (four) hours as needed. 02/09/14   Donnetta Hutching, MD  oxyCODONE-acetaminophen (PERCOCET) 5-325 MG per tablet Take 2 tablets by mouth every 4 (four) hours as needed. 02/09/14   Donnetta Hutching, MD  tamsulosin (FLOMAX) 0.4 MG CAPS capsule Take 1 capsule (0.4 mg total) by mouth daily. 02/09/14   Donnetta Hutching, MD   BP 148/98 mmHg  Pulse 91  Temp(Src) 98.1 F (36.7 C) (Oral)  Resp 18  Ht 6' (1.829 m)  Wt 225 lb (102.059 kg)  BMI 30.51 kg/m2  SpO2 97% Physical Exam  Constitutional: He is oriented to person, place, and time. He appears well-developed and well-nourished.  Non-toxic appearance.  HENT:  Head: Normocephalic.  Right Ear: Tympanic membrane and external ear normal.  Left Ear: Tympanic membrane and external ear normal.  Eyes: EOM and lids are normal. Pupils are equal, round, and reactive to light.  Neck: Normal range of motion. Neck supple. Carotid bruit is not present.  Cardiovascular: Normal rate, regular rhythm, normal heart sounds, intact distal pulses and normal pulses.   Pulmonary/Chest: Breath sounds normal. No respiratory distress.  Abdominal: Soft. Bowel sounds are normal. There is no tenderness. There is no guarding.  Musculoskeletal: Normal range of motion.       Feet:  Pain and soreness to palpation of the dorsum of the right foot. No palpable or visible deformity appreciated. Achilles tendon is intact. Right knee and hip show full range of motion.  Lymphadenopathy:       Head (right side): No submandibular adenopathy present.       Head (left side): No submandibular adenopathy present.    He has no cervical adenopathy.  Neurological: He is alert and oriented to person, place, and time. He has normal strength. No cranial nerve deficit or sensory  deficit. He exhibits normal muscle tone. Coordination normal.  Skin: Skin is warm and dry.  Psychiatric: He has a normal mood and affect. His speech is normal.  Nursing note and vitals reviewed.   ED Course  Procedures (including critical care time) Labs Review Labs Reviewed - No data to display  Imaging Review No results found.   EKG Interpretation None      MDM  X-ray of the right foot is negative for fracture or dislocation. Patient made aware of x-ray findings. There no neurovascular changes appreciated. Canvas shoe offered, but the patient states that he can use his regular shoe with the laces loose. Patient already has his own crutches. Have advised patient to apply ice, to elevate the foot above the waist. I have also advised the patient to use Tylenol every 4 hours, or ibuprofen every 6 hours for soreness. The patient states that he has other pain medicines to use if needed.    Final diagnoses:  None    *I have  reviewed nursing notes, vital signs, and all appropriate lab and imaging results for this patient.    Ivery QualeHobson Kalyssa Anker, PA-C 04/15/14 1251  Mancel BaleElliott Wentz, MD 04/15/14 [phone]

## 2014-04-28 ENCOUNTER — Encounter (HOSPITAL_COMMUNITY): Payer: Self-pay | Admitting: Emergency Medicine

## 2014-04-28 ENCOUNTER — Emergency Department (INDEPENDENT_AMBULATORY_CARE_PROVIDER_SITE_OTHER)
Admission: EM | Admit: 2014-04-28 | Discharge: 2014-04-28 | Disposition: A | Discharge status: Home / Self Care | Payer: Self-pay | Source: Home / Self Care | Attending: Family Medicine | Admitting: Family Medicine

## 2014-04-28 ENCOUNTER — Emergency Department (INDEPENDENT_AMBULATORY_CARE_PROVIDER_SITE_OTHER): Payer: PRIVATE HEALTH INSURANCE

## 2014-04-28 DIAGNOSIS — S8991XA Unspecified injury of right lower leg, initial encounter: Secondary | ICD-10-CM | POA: Diagnosis not present

## 2014-04-28 MED ORDER — DICLOFENAC SODIUM 75 MG PO TBEC: 75.0000 mg | DELAYED_RELEASE_TABLET | Freq: Two times a day (BID) | ORAL | Status: DC | PRN

## 2014-04-28 NOTE — ED Notes (Signed)
Reports stepping down and twisting right knee.  Incident happened 4/6.   States  Knee feel better when straightened out.  Mild relief in pain and swelling with ice/elevation.

## 2014-04-28 NOTE — Discharge Instructions (Signed)
Thank you for coming in today. Follow up with Occupational Health Clinic or your workers compensation insurance clinic.   [facility] Address: [address], [place], [place] [address] [phone]  Take diclofenac twice daily as needed for pain  If you need a workers Naval architectcompensation lawyer consider Calling Estrellita LudwigJennifer Bowden at [phone] or   [employer] Address: [address], [place], [place] [address] [phone]  Knee, Cartilage (Meniscus) Injury It is suspected that you have a torn cartilage (meniscus) in your knee. The menisci are made of tough cartilage and fit between the surfaces of the thigh and leg bones. The menisci are C-shaped and have a wedged profile. The wedged profile helps the stability of the joint by keeping the rounded femur surface from sliding off the flat tibial surface. The menisci are fed (nourished) by small blood vessels, but there is also a large area at the inner edge of the meniscus that does not have a good blood supply (avascular). This presents a problem when there is an injury to the meniscus because areas without good blood supply heal poorly. As a result when there is a torn cartilage in the knee, surgery is often required to fix it. This is usually done with a surgical procedure less invasive than open surgery (arthroscopy). Some times open surgery of the knee is required if there is other damage. PURPOSE OF THE MENISCUS The medial meniscus rests on the medial tibial plateau. The tibia is the large bone in your lower leg (the shin bone). The medial tibial plateau is the upper end of the bone making up the inner part of your knee. The lateral meniscus serves the same purpose and is located on the outside of the knee. The menisci help to distribute your body weight across the knee joint; they act as shock absorbers. Without the meniscus present, the weight of your body would be unevenly applied to the bones in your  legs (the femur and tibia). The femur is the large bone in your thigh. This uneven weight distribution would cause increased wear and tear on the cartilage lining the joint surfaces, leading to early damage (arthritis) of these areas. The presence of the menisci cartilage is necessary for a healthy knee. PURPOSE OF THE KNEE CARTILAGE The knee joint is made up of three bones: the thigh bone (femur), the shin bone (tibia), and the knee cap (patella). The surfaces of these bones at the knee joint are covered with cartilage called articular cartilage. This smooth, slippery surface allows the bones to slide against each other without causing bone damage. The meniscus sits between these cartilaginous surfaces of the bones. It distributes the weight evenly in the joints and helps with the stability of the joint (keeps the joint steady). HOME CARE INSTRUCTIONS  Use crutches and external braces as instructed.  Once home, an ice pack applied to your injured knee may help with discomfort and keep the swelling down. An ice pack can be used for the first couple of days or as instructed.  Only take over-the-counter or prescription medicines for pain, discomfort, or fever as directed by your caregiver.  Call if you do not have relief of pain with medications or if there is increasing in pain.  Call if your foot becomes cold or blue.  You may resume normal diet and activities as directed.  Make sure to keep your appointments with your follow-up caregiver. This injury may require further evaluation and treatment beyond the temporary  treatment given today. Document Released: 03/30/2002 Document Revised: 05/24/2013 Document Reviewed: 07/22/2008 [facility] Patient Information 2015 [place], [place]. This information is not intended to replace advice given to you by your health care provider. Make sure you discuss any questions you have with your health care provider.

## 2014-04-28 NOTE — ED Provider Notes (Signed)
[name] is a 44 y.o. male who presents to Urgent Care today for right knee injury. 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 He stepped down from the equipment and fell. As he was falling his right knee twisted. He notes continued pain and swelling in his right knee. He's tried ice rest and elevation. He denies any radiating pain weakness or numbness.   Past Medical History  Diagnosis Date  . Hypertension     Lab 02/2011: Normal CBC and BMet, TSH-1.6  . History of PSVT (paroxysmal supraventricular tachycardia)     s/p ablation 2006; occasional palpitations; occasional atypical left chest pain; event recorder in 03/2011-normal sinus, sinus tachycardia, single symptomatic spell with sinus rhythm  . Hyperlipidemia     Intolerant to statins causing tongue swelling  . Obstructive sleep apnea     Wears CPAP intermittantly. Followed by Dr. Gildardo Cranker Pulmonology.  . Hyperlipidemia     Lipid profile in 2008:225, 770, 27,?; statin allergy-oral edema  . Gastroesophageal reflux disease   . Anxiety and depression     2009-suicidal ideation, involuntary commitment  . Congestive heart failure ?  . Coronary artery disease    Past Surgical History  Procedure Laterality Date  . Nasal turbinate reduction    . Shoulder surgery      Right  . Ankle surgery      Right  . Left heart catheterization with coronary angiogram N/A 02/21/2011    Procedure: LEFT HEART CATHETERIZATION WITH CORONARY ANGIOGRAM;  Surgeon: Tonny Bollman, MD;  Location: [facility] CATH LAB;  Service: Cardiovascular;  Laterality: N/A;   History  Substance Use Topics  . Smoking status: Current Every Day Smoker -- 1.00 packs/day for 23 years    Types: Cigarettes    Start date: 06/30/1987  . Smokeless tobacco: Never Used  . Alcohol Use: No   ROS as above Medications: No current facility-administered medications for this encounter.   Current  Outpatient Prescriptions  Medication Sig Dispense Refill  . esomeprazole (NEXIUM) 40 MG capsule Take 40 mg by mouth daily before breakfast.      . lamoTRIgine (LAMICTAL) 200 MG tablet Take 200 mg by mouth daily.    . metoprolol succinate (TOPROL-XL) 100 MG 24 hr tablet Take 100 mg by mouth daily. Take with or immediately following a meal.    . diclofenac (VOLTAREN) 75 MG EC tablet Take 1 tablet (75 mg total) by mouth 2 (two) times daily as needed. 30 tablet 0  . HYDROcodone-acetaminophen (NORCO) 7.5-325 MG per tablet Take 1 tablet by mouth every 4 (four) hours as needed. 20 tablet 0   Allergies  Allergen Reactions  . Simvastatin Rash    "I get real hot"     Exam:  BP 138/103 mmHg  Pulse 88  Temp(Src) 97.7 F (36.5 C) (Oral)  Resp 18  SpO2 98% Gen: Well NAD HEENT: EOMI,  MMM Lungs: Normal work of breathing. CTABL Heart: RRR no MRG Abd: NABS, Soft. Nondistended, Nontender Exts: Brisk capillary refill, warm and well perfused.  Right knee: Slight effusion. Range of motion 0-100. Tender palpation medial joint line. Stable ligamentous exam however patient guards with exam. Pain but no laxity with stressing the MCL. Positive medial McMurray's test  No results found for this or any previous visit (from the past 24 hour(s)). Dg Knee Complete 4 Views Right  04/28/2014   CLINICAL DATA:  Injury to RIGHT knee stepping off  a tractor yesterday, twisted knee to keep from falling, pain  EXAM: RIGHT KNEE - COMPLETE 4+ VIEW  COMPARISON:  06/22/2013  FINDINGS: Bone mineralization normal.  Joint spaces preserved.  No fracture, dislocation, or bone destruction.  No joint effusion.  IMPRESSION: Normal exam.   Electronically Signed   By: Ulyses SouthwardMark  Boles M.D.   On: 04/28/2014 11:26    Assessment and Plan: 44 y.o. male with right knee work-related injury. Concern for meniscus injury. Plan for upright hinged knee brace, NSAIDs, and follow up with occupational health or orthopedics.  Discussed warning  signs or symptoms. Please see discharge instructions. Patient expresses understanding.     Rodolph BongEvan S Corey, MD 04/28/14 [phone]

## 2014-05-19 ENCOUNTER — Telehealth: Payer: Self-pay | Admitting: Orthopedic Surgery

## 2014-05-19 NOTE — Telephone Encounter (Signed)
Update to note 05/03/11: Email received on 05/19/14, as follows: "Confirmed with [name]/HR @ [facility] [employer] that patient [name] has been referred to another Orthopedic provider since it was apparent that surgery was going to be needed and they would not be paying out of pocket but filing with their WC carrier.  Patient was sent to Ortho of their choice and not the patients due to location.    Case closed and [name]/[facility] has been notified.    Thanks,  [name], [name]

## 2014-06-07 ENCOUNTER — Telehealth: Payer: Self-pay | Admitting: Cardiology

## 2014-06-07 NOTE — Telephone Encounter (Signed)
[name] called stating that he needs to have orthopedic surgery by [facility] Dr. Ranell PatrickNorris told the patient To contact our office in regards to this. I explained to [name] that the surgerons office needs to contact us. I did call Dr. Ranell PatrickNorris Office trying to help [name] however I was unable to speak with anyone.

## 2014-06-07 NOTE — Telephone Encounter (Signed)
Pt is having patella tendon tear right knee surgery as soon as he can. LM for Dr. Ranell PatrickNorris office and will forward to Dr. Wyline MoodBranch.

## 2014-06-08 NOTE — Telephone Encounter (Signed)
Surgery is fine, I addended my Jan clinic note, please forward to his surgeon   Dominga FerryJ Branch MD

## 2014-06-09 NOTE — Telephone Encounter (Signed)
Forwarded office note with addendum to Dr. Ranell PatrickNorris

## 2014-06-27 ENCOUNTER — Emergency Department (HOSPITAL_COMMUNITY)
Admission: EM | Admit: 2014-06-27 | Discharge: 2014-06-28 | Discharge status: Against Medical Advice | Payer: PRIVATE HEALTH INSURANCE | Attending: Emergency Medicine | Admitting: Emergency Medicine

## 2014-06-27 ENCOUNTER — Encounter (HOSPITAL_COMMUNITY): Payer: Self-pay | Admitting: Emergency Medicine

## 2014-06-27 DIAGNOSIS — Z720 Tobacco use: Secondary | ICD-10-CM | POA: Diagnosis not present

## 2014-06-27 DIAGNOSIS — I10 Essential (primary) hypertension: Secondary | ICD-10-CM | POA: Diagnosis not present

## 2014-06-27 DIAGNOSIS — I509 Heart failure, unspecified: Secondary | ICD-10-CM | POA: Diagnosis not present

## 2014-06-27 DIAGNOSIS — I2510 Atherosclerotic heart disease of native coronary artery without angina pectoris: Secondary | ICD-10-CM | POA: Insufficient documentation

## 2014-06-27 DIAGNOSIS — M25561 Pain in right knee: Secondary | ICD-10-CM | POA: Insufficient documentation

## 2014-06-27 NOTE — ED Notes (Signed)
Pt st's he had right knee surg on June 1.  St's today he noticed 2 knots on right lat lower leg,  Also st's muscles in right leg feel like they are lociked

## 2014-06-27 NOTE — ED Notes (Signed)
Called pt to place in room - no answer

## 2015-02-02 ENCOUNTER — Ambulatory Visit (HOSPITAL_COMMUNITY)
Admission: RE | Admit: 2015-02-02 | Discharge: 2015-02-02 | Disposition: A | Discharge status: Home / Self Care | Payer: Self-pay | Source: Ambulatory Visit | Attending: Family Medicine | Admitting: Family Medicine

## 2015-02-02 ENCOUNTER — Other Ambulatory Visit (HOSPITAL_COMMUNITY): Payer: Self-pay | Admitting: Family Medicine

## 2015-02-02 DIAGNOSIS — H05019 Cellulitis of unspecified orbit: Secondary | ICD-10-CM

## 2015-02-02 DIAGNOSIS — H05011 Cellulitis of right orbit: Secondary | ICD-10-CM | POA: Insufficient documentation

## 2015-02-02 MED ORDER — IOHEXOL 300 MG/ML  SOLN
75.0000 mL | Freq: Once | INTRAMUSCULAR | Status: AC | PRN
  Administered 2015-02-02: 75 mL via INTRAVENOUS

## 2015-02-28 ENCOUNTER — Other Ambulatory Visit: Payer: Self-pay | Admitting: Orthopedic Surgery

## 2015-02-28 DIAGNOSIS — M25561 Pain in right knee: Secondary | ICD-10-CM

## 2015-03-05 ENCOUNTER — Ambulatory Visit
Admission: RE | Admit: 2015-03-05 | Discharge: 2015-03-05 | Disposition: A | Discharge status: Home / Self Care | Payer: Worker's Compensation | Source: Ambulatory Visit | Attending: Orthopedic Surgery | Admitting: Orthopedic Surgery

## 2015-03-05 DIAGNOSIS — M25561 Pain in right knee: Secondary | ICD-10-CM

## 2015-08-29 ENCOUNTER — Emergency Department (HOSPITAL_COMMUNITY)
Admission: EM | Admit: 2015-08-29 | Discharge: 2015-08-30 | Disposition: A | Discharge status: Home / Self Care | Payer: Self-pay | Attending: Dermatology | Admitting: Dermatology

## 2015-08-29 ENCOUNTER — Encounter (HOSPITAL_COMMUNITY): Payer: Self-pay | Admitting: Emergency Medicine

## 2015-08-29 DIAGNOSIS — F17210 Nicotine dependence, cigarettes, uncomplicated: Secondary | ICD-10-CM | POA: Insufficient documentation

## 2015-08-29 DIAGNOSIS — I2510 Atherosclerotic heart disease of native coronary artery without angina pectoris: Secondary | ICD-10-CM | POA: Insufficient documentation

## 2015-08-29 DIAGNOSIS — Z5321 Procedure and treatment not carried out due to patient leaving prior to being seen by health care provider: Secondary | ICD-10-CM | POA: Insufficient documentation

## 2015-08-29 DIAGNOSIS — M25561 Pain in right knee: Secondary | ICD-10-CM | POA: Insufficient documentation

## 2015-08-29 DIAGNOSIS — I509 Heart failure, unspecified: Secondary | ICD-10-CM | POA: Insufficient documentation

## 2015-08-29 DIAGNOSIS — I110 Hypertensive heart disease with heart failure: Secondary | ICD-10-CM | POA: Insufficient documentation

## 2015-08-29 MED ORDER — OXYCODONE-ACETAMINOPHEN 5-325 MG PO TABS: 1.0000 | ORAL_TABLET | ORAL | Status: DC | PRN

## 2015-08-29 MED ORDER — OXYCODONE-ACETAMINOPHEN 5-325 MG PO TABS
ORAL_TABLET | ORAL | Status: AC
  Filled 2015-08-29: qty 1

## 2015-08-29 NOTE — ED Triage Notes (Signed)
Pt here with right knee pain x 3-4 days. Pt reports extensive knee surgery last year and thinks that he has "torn everything loose." Pt sts unable to bear weight. CMS intact.

## 2015-08-30 NOTE — ED Notes (Signed)
Pt called multiple times in waiting room and no response.

## 2015-10-23 ENCOUNTER — Other Ambulatory Visit (HOSPITAL_COMMUNITY): Payer: Self-pay | Admitting: Family Medicine

## 2015-10-23 ENCOUNTER — Ambulatory Visit (HOSPITAL_COMMUNITY)
Admission: RE | Admit: 2015-10-23 | Discharge: 2015-10-23 | Disposition: A | Discharge status: Home / Self Care | Payer: Self-pay | Source: Ambulatory Visit | Attending: Family Medicine | Admitting: Family Medicine

## 2015-10-23 DIAGNOSIS — R079 Chest pain, unspecified: Secondary | ICD-10-CM

## 2015-10-23 DIAGNOSIS — I2699 Other pulmonary embolism without acute cor pulmonale: Secondary | ICD-10-CM

## 2015-10-23 MED ORDER — IOPAMIDOL (ISOVUE-370) INJECTION 76%
100.0000 mL | Freq: Once | INTRAVENOUS | Status: AC | PRN
  Administered 2015-10-23: 100 mL via INTRAVENOUS

## 2015-10-24 ENCOUNTER — Emergency Department (HOSPITAL_COMMUNITY)
Admission: EM | Admit: 2015-10-24 | Discharge: 2015-10-24 | Disposition: A | Discharge status: Home / Self Care | Payer: Self-pay | Attending: Emergency Medicine | Admitting: Emergency Medicine

## 2015-10-24 ENCOUNTER — Emergency Department (HOSPITAL_COMMUNITY): Payer: Self-pay

## 2015-10-24 ENCOUNTER — Encounter (HOSPITAL_COMMUNITY): Payer: Self-pay

## 2015-10-24 ENCOUNTER — Other Ambulatory Visit: Payer: Self-pay

## 2015-10-24 DIAGNOSIS — M25512 Pain in left shoulder: Secondary | ICD-10-CM

## 2015-10-24 DIAGNOSIS — I110 Hypertensive heart disease with heart failure: Secondary | ICD-10-CM | POA: Insufficient documentation

## 2015-10-24 DIAGNOSIS — I2510 Atherosclerotic heart disease of native coronary artery without angina pectoris: Secondary | ICD-10-CM | POA: Insufficient documentation

## 2015-10-24 DIAGNOSIS — F17210 Nicotine dependence, cigarettes, uncomplicated: Secondary | ICD-10-CM | POA: Insufficient documentation

## 2015-10-24 DIAGNOSIS — M25519 Pain in unspecified shoulder: Secondary | ICD-10-CM

## 2015-10-24 DIAGNOSIS — M62838 Other muscle spasm: Secondary | ICD-10-CM | POA: Insufficient documentation

## 2015-10-24 DIAGNOSIS — I509 Heart failure, unspecified: Secondary | ICD-10-CM | POA: Insufficient documentation

## 2015-10-24 LAB — COMPREHENSIVE METABOLIC PANEL
ALBUMIN: 3.6 g/dL (ref 3.5–5.0)
ALT: 14 U/L — ABNORMAL LOW (ref 17–63)
ANION GAP: 9 (ref 5–15)
AST: 15 U/L (ref 15–41)
Alkaline Phosphatase: 90 U/L (ref 38–126)
BILIRUBIN TOTAL: 1 mg/dL (ref 0.3–1.2)
BUN: 7 mg/dL (ref 6–20)
CHLORIDE: 101 mmol/L (ref 101–111)
CO2: 26 mmol/L (ref 22–32)
Calcium: 9.2 mg/dL (ref 8.9–10.3)
Creatinine, Ser: 0.96 mg/dL (ref 0.61–1.24)
GFR calc Af Amer: >60 mL/min (ref >60)
Glucose, Bld: 111 mg/dL — ABNORMAL HIGH (ref 65–99)
POTASSIUM: 4 mmol/L (ref 3.5–5.1)
Sodium: 136 mmol/L (ref 135–145)
TOTAL PROTEIN: 6.7 g/dL (ref 6.5–8.1)

## 2015-10-24 LAB — CBC WITH DIFFERENTIAL/PLATELET
BASOS ABS: 0 10*3/uL (ref 0.0–0.1)
BASOS PCT: 0 %
EOS PCT: 1 %
Eosinophils Absolute: 0.2 10*3/uL (ref 0.0–0.7)
HEMATOCRIT: 46 % (ref 39.0–52.0)
Hemoglobin: 15.4 g/dL (ref 13.0–17.0)
Lymphocytes Relative: 16 %
Lymphs Abs: 1.8 10*3/uL (ref 0.7–4.0)
MCH: 30.6 pg (ref 26.0–34.0)
MCHC: 33.5 g/dL (ref 30.0–36.0)
MCV: 91.5 fL (ref 78.0–100.0)
MONO ABS: 1.2 10*3/uL — AB (ref 0.1–1.0)
MONOS PCT: 11 %
NEUTROS ABS: 7.9 10*3/uL — AB (ref 1.7–7.7)
Neutrophils Relative %: 72 %
PLATELETS: 234 10*3/uL (ref 150–400)
RBC: 5.03 MIL/uL (ref 4.22–5.81)
RDW: 14.2 % (ref 11.5–15.5)
WBC: 11 10*3/uL — ABNORMAL HIGH (ref 4.0–10.5)

## 2015-10-24 LAB — I-STAT TROPONIN, ED
TROPONIN I, POC: 0 ng/mL (ref 0.00–0.08)
TROPONIN I, POC: 0 ng/mL (ref 0.00–0.08)

## 2015-10-24 MED ORDER — DIAZEPAM 5 MG PO TABS: 5.0000 mg | ORAL_TABLET | Freq: Two times a day (BID) | ORAL | 0 refills | Status: DC

## 2015-10-24 MED ORDER — DIAZEPAM 5 MG PO TABS
5.0000 mg | ORAL_TABLET | Freq: Once | ORAL | Status: AC
  Administered 2015-10-24: 5 mg via ORAL
  Filled 2015-10-24: qty 1

## 2015-10-24 MED ORDER — DICLOFENAC SODIUM 1 % TD GEL: 2.0000 g | Freq: Four times a day (QID) | TRANSDERMAL | 1 refills | Status: DC

## 2015-10-24 MED ORDER — OXYCODONE-ACETAMINOPHEN 5-325 MG PO TABS
1.0000 | ORAL_TABLET | Freq: Once | ORAL | Status: AC
  Administered 2015-10-24: 1 via ORAL
  Filled 2015-10-24: qty 1

## 2015-10-24 MED ORDER — SODIUM CHLORIDE 0.9 % IV BOLUS (SEPSIS)
1000.0000 mL | Freq: Once | INTRAVENOUS | Status: AC
  Administered 2015-10-24: 1000 mL via INTRAVENOUS

## 2015-10-24 MED ORDER — FENTANYL CITRATE (PF) 100 MCG/2ML IJ SOLN
50.0000 ug | Freq: Once | INTRAMUSCULAR | Status: AC
  Administered 2015-10-24: 50 ug via INTRAVENOUS
  Filled 2015-10-24: qty 2

## 2015-10-24 NOTE — ED Triage Notes (Signed)
Pt from home by EMS with left shoulder pain. Pt has seen PCP for this and they ruled out clot this has been going on for about 4 days. At 0300 pt has onset of nausea and dizziness for about 5-526mins that then subsided. Pt has had 324ASA with EMS

## 2015-10-24 NOTE — Discharge Instructions (Addendum)
We are unsure what is causing your pain today. We are reasonably sure it is not your heart given your normal lab results, and your normal CAT scan earlier this week was reassuring. We will need to follow up with her primary care physician.

## 2015-10-24 NOTE — ED Provider Notes (Signed)
[facility] DEPT Provider Note   CSN: [id number] Arrival date & time: 10/24/15  0503     History   Chief Complaint Chief Complaint  Patient presents with  . Shoulder Pain    left    HPI [name] is a 45 y.o. male.  HPI   Patient's a 45 year old male with history of CHF, SVT status post ablation, hypertension presenting with pain to his left shoulder since Friday. Patient has seen primary care physician about this and had a CT to rule out dissection/PE. Patient states that last night he had the pain worsen to his left shoulder/back/chest and side and woke him up. He grimaces occasionally but states it has been hurting since Thursday.  He reports that he had some clamminess at that time. Mild shortness of breath.  No fevers. No cough.  Past Medical History:  Diagnosis Date  . Anxiety and depression    2009-suicidal ideation, involuntary commitment  . Congestive heart failure (HCC) ?  Marland Kitchen Coronary artery disease   . Gastroesophageal reflux disease   . History of PSVT (paroxysmal supraventricular tachycardia)    s/p ablation 2006; occasional palpitations; occasional atypical left chest pain; event recorder in 03/2011-normal sinus, sinus tachycardia, single symptomatic spell with sinus rhythm  . Hyperlipidemia    Intolerant to statins causing tongue swelling  . Hyperlipidemia    Lipid profile in 2008:225, 770, 27,?; statin allergy-oral edema  . Hypertension    Lab 02/2011: Normal CBC and BMet, TSH-1.6  . Obstructive sleep apnea    Wears CPAP intermittantly. Followed by Dr. Gildardo Cranker Pulmonology.    Patient Active Problem List   Diagnosis Date Noted  . Hypertension   . History of PSVT (paroxysmal supraventricular tachycardia)   . Obstructive sleep apnea   . Hyperlipidemia   . Gastroesophageal reflux disease   . Anxiety and depression     Past Surgical History:  Procedure Laterality Date  . ANKLE SURGERY     Right  . LEFT HEART CATHETERIZATION WITH  CORONARY ANGIOGRAM N/A 02/21/2011   Procedure: LEFT HEART CATHETERIZATION WITH CORONARY ANGIOGRAM;  Surgeon: Tonny Bollman, MD;  Location: [facility] CATH LAB;  Service: Cardiovascular;  Laterality: N/A;  . NASAL TURBINATE REDUCTION    . SHOULDER SURGERY     Right       Home Medications    Prior to Admission medications   Medication Sig Start Date End Date Taking? Authorizing Provider  budesonide-formoterol (SYMBICORT) 160-4.5 MCG/ACT inhaler Inhale 2 puffs into the lungs 2 (two) times daily.   Yes Historical Provider, MD  citalopram (CELEXA) 20 MG tablet Take 20 mg by mouth daily.   Yes Historical Provider, MD  esomeprazole (NEXIUM) 40 MG capsule Take 40 mg by mouth daily before breakfast.     Yes Historical Provider, MD  lamoTRIgine (LAMICTAL) 200 MG tablet Take 200 mg by mouth daily.   Yes Historical Provider, MD  levofloxacin (LEVAQUIN) 750 MG tablet Take 750 mg by mouth daily.   Yes Historical Provider, MD  metoprolol succinate (TOPROL-XL) 100 MG 24 hr tablet Take 100 mg by mouth daily. Take with or immediately following a meal.   Yes Historical Provider, MD  oxyCODONE (OXY IR/ROXICODONE) 5 MG immediate release tablet Take 5-10 mg by mouth every 4 (four) hours as needed for severe pain.   Yes Historical Provider, MD    Family History Family History  Problem Relation Age of Onset  . Heart disease Father   . Heart attack Father   . Heart disease  Paternal Grandfather     Social History Social History  Substance Use Topics  . Smoking status: Current Every Day Smoker    Packs/day: 1.00    Years: 23.00    Types: Cigarettes    Start date: 06/30/1987  . Smokeless tobacco: Never Used  . Alcohol use No     Allergies   Simvastatin   Review of Systems Review of Systems  Constitutional: Negative for fatigue and fever.  HENT: Negative for congestion.   Respiratory: Positive for chest tightness.   Cardiovascular: Positive for chest pain.  Gastrointestinal: Negative for abdominal  pain.  Musculoskeletal: Negative for back pain.  All other systems reviewed and are negative.    Physical Exam Updated Vital Signs BP 126/89   Pulse 76   Resp 24   SpO2 94%   Physical Exam  Constitutional: He appears well-developed and well-nourished.  HENT:  Head: Normocephalic and atraumatic.  Eyes: Conjunctivae are normal.  Neck: Neck supple.  Cardiovascular: Normal rate and regular rhythm.   No murmur heard. Pulmonary/Chest: Effort normal and breath sounds normal. No respiratory distress.  Abdominal: Soft. There is no tenderness.  Musculoskeletal: He exhibits no edema.  Neurological: He is alert.  Skin: Skin is warm and dry.  Psychiatric: He has a normal mood and affect.  Nursing note and vitals reviewed.    ED Treatments / Results  Labs (all labs ordered are listed, but only abnormal results are displayed) Labs Reviewed  CBC WITH DIFFERENTIAL/PLATELET - Abnormal; Notable for the following:       Result Value   WBC 11.0 (*)    Neutro Abs 7.9 (*)    Monocytes Absolute 1.2 (*)    All other components within normal limits  COMPREHENSIVE METABOLIC PANEL - Abnormal; Notable for the following:    Glucose, Bld 111 (*)    ALT 14 (*)    All other components within normal limits  I-STAT TROPOININ, ED    EKG  EKG Interpretation  Date/Time:  Tuesday October 24 2015 05:02:23 EDT Ventricular Rate:  76 PR Interval:  176 QRS Duration: 104 QT Interval:  392 QTC Calculation: 441 R Axis:   3 Text Interpretation:  Normal sinus rhythm Cannot rule out Inferior infarct , age undetermined Abnormal ECG No significant change since last tracing Confirmed by Kandis Mannan ([id number]) on 10/24/2015 6:31:19 AM       Radiology Dg Chest 2 View  Result Date: 10/24/2015 CLINICAL DATA:  Mid chest pain, nausea, left shoulder and arm pain tonight. EXAM: CHEST  2 VIEW COMPARISON:  CT chest 10/23/2015.  Chest 02/01/2014 FINDINGS: Normal heart size and pulmonary vascularity. No focal  airspace disease or consolidation in the lungs. No blunting of costophrenic angles. No pneumothorax. Mediastinal contours appear intact. Old left rib fractures. IMPRESSION: No active cardiopulmonary disease. Electronically Signed   By: Burman Nieves M.D.   On: 10/24/2015 06:13   Ct Angio Chest Pe W Or Wo Contrast  Result Date: 10/23/2015 CLINICAL DATA:  Chest pain and shortness of Breath EXAM: CT ANGIOGRAPHY CHEST WITH CONTRAST TECHNIQUE: Multidetector CT imaging of the chest was performed using the standard protocol during bolus administration of intravenous contrast. Multiplanar CT image reconstructions and MIPs were obtained to evaluate the vascular anatomy. CONTRAST:  100 mL Isovue 370 nonionic COMPARISON:  Chest CT December 29, 2012 and chest radiograph February 01, 2014 FINDINGS: Cardiovascular: There is no demonstrable pulmonary embolus. There is no thoracic aortic aneurysm or dissection. Visualized great vessels appear normal. Left vertebral artery arises  directly from the aortic arch, an anatomic variant. Pericardium is not appreciably thickened. Mediastinum/Nodes: Visualized thyroid appears unremarkable. There is no appreciable thoracic adenopathy. Lungs/Pleura: There is no parenchymal lung edema or consolidation. There is mild dependent atelectasis in both lower lobes. Upper Abdomen: Visualized upper abdominal structures appear unremarkable. Musculoskeletal: There are no blastic or lytic bone lesions. There old healed rib fractures on the left. Review of the MIP images confirms the above findings. IMPRESSION: No demonstrable pulmonary embolus. No thoracic aortic aneurysm or dissection evident. No lung edema or consolidation. No adenopathy. Electronically Signed   By: Bretta BangWilliam  Woodruff III M.D.   On: 10/23/2015 12:34    Procedures Procedures (including critical care time)  Medications Ordered in ED Medications  oxyCODONE-acetaminophen (PERCOCET/ROXICET) 5-325 MG per tablet 1 tablet (not  administered)  sodium chloride 0.9 % bolus 1,000 mL (0 mLs Intravenous Stopped 10/24/15 0714)  fentaNYL (SUBLIMAZE) injection 50 mcg (50 mcg Intravenous Given 10/24/15 0546)     Initial Impression / Assessment and Plan / ED Course  I have reviewed the triage vital signs and the nursing notes.  Pertinent labs & imaging results that were available during my care of the patient were reviewed by me and considered in my medical decision making (see chart for details).  Clinical Course    Patient is a 45 year old male presenting with left shoulder pain. This acutely got worse last night. However is been going on for several days. Patient had negative CT ordered by outpatient provider. Dissection, CT PE ruled out by recent CAT scan. Here his EKG is nonischemic and initial troponin is negative. Patient does have hypertension hyperlipidemia and is a current smoker. However his story is very atypical given the fact that this been going on since Thursday, is not exertionally related, not associated with diaphoresis. We will get a delta troponin and then plan to discharge patient home to follow up with his primary care physician. Patient had normal stress test in 2015.    7:46 AM Initial tests are negative. We'll repeat the troponin 9 AM and plan to discharge home with follow-up closely with PCP.  Final Clinical Impressions(s) / ED Diagnoses   Final diagnoses:  None    New Prescriptions New Prescriptions   No medications on file     Megha Agnes Randall AnLyn Garnet Chatmon, MD 10/24/15 [phone]

## 2015-10-24 NOTE — ED Provider Notes (Signed)
Second troponin is negative. On exam patient has significant pain and spasm of his left trapezius. 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 The pain will run down his left arm and he has no evidence of dissection or pulmonary issues. Feel most likely patient's symptoms are musculoskeletal. He had significant improvement after Valium. He has oxycodone at home but encouraged him to continue taking ibuprofen and given him a prescription for by him volatren gel. He also is planning on following up with Dr. Tiburcio PeaHarris with orthopedics if things do not improve.   Gwyneth SproutWhitney Darianna Amy, MD 10/24/15 1029

## 2015-10-24 NOTE — ED Notes (Signed)
Pt returned from radiology at this time via ED stretcher. Pt in no apparent distress at this time.   

## 2015-12-27 ENCOUNTER — Telehealth: Payer: Self-pay | Admitting: Cardiology

## 2015-12-27 NOTE — Telephone Encounter (Signed)
Numerous attempts to contact patient with recall letters with no success.  02/03/2014 4:02 PM New [10]     [System] 10/22/2014 11:01 PM Notification Sent [20]   [name] [[id number]][[id number]] 02/27/2015 12:02 PM Notification Sent [20]   [name] [[id number]][[id number]] 09/15/2015 5:01 PM Notification Sent [20]   [name] [[id number]][[id number]] 10/03/2015 11:27 AM Notification Sent [20]   [name] [[id number]][[id number]] 12/27/2015 8:12 AM Notification Sent [20]

## 2016-05-24 ENCOUNTER — Emergency Department (HOSPITAL_COMMUNITY)
Admission: EM | Admit: 2016-05-24 | Discharge: 2016-05-24 | Disposition: A | Discharge status: Home / Self Care | Payer: Self-pay | Attending: Emergency Medicine | Admitting: Emergency Medicine

## 2016-05-24 ENCOUNTER — Encounter (HOSPITAL_COMMUNITY): Payer: Self-pay | Admitting: Emergency Medicine

## 2016-05-24 ENCOUNTER — Emergency Department (HOSPITAL_COMMUNITY): Payer: Self-pay

## 2016-05-24 DIAGNOSIS — S93402A Sprain of unspecified ligament of left ankle, initial encounter: Secondary | ICD-10-CM

## 2016-05-24 DIAGNOSIS — W230XXA Caught, crushed, jammed, or pinched between moving objects, initial encounter: Secondary | ICD-10-CM | POA: Insufficient documentation

## 2016-05-24 DIAGNOSIS — I2510 Atherosclerotic heart disease of native coronary artery without angina pectoris: Secondary | ICD-10-CM | POA: Insufficient documentation

## 2016-05-24 DIAGNOSIS — I509 Heart failure, unspecified: Secondary | ICD-10-CM | POA: Insufficient documentation

## 2016-05-24 DIAGNOSIS — Y929 Unspecified place or not applicable: Secondary | ICD-10-CM | POA: Insufficient documentation

## 2016-05-24 DIAGNOSIS — Y999 Unspecified external cause status: Secondary | ICD-10-CM | POA: Insufficient documentation

## 2016-05-24 DIAGNOSIS — S93602A Unspecified sprain of left foot, initial encounter: Secondary | ICD-10-CM

## 2016-05-24 DIAGNOSIS — Y9389 Activity, other specified: Secondary | ICD-10-CM | POA: Insufficient documentation

## 2016-05-24 DIAGNOSIS — I110 Hypertensive heart disease with heart failure: Secondary | ICD-10-CM | POA: Insufficient documentation

## 2016-05-24 DIAGNOSIS — F17210 Nicotine dependence, cigarettes, uncomplicated: Secondary | ICD-10-CM | POA: Insufficient documentation

## 2016-05-24 MED ORDER — HYDROCODONE-ACETAMINOPHEN 5-325 MG PO TABS: 2.0000 | ORAL_TABLET | ORAL | 0 refills | Status: DC | PRN

## 2016-05-24 MED ORDER — HYDROMORPHONE HCL 1 MG/ML IJ SOLN
1.0000 mg | Freq: Once | INTRAMUSCULAR | Status: AC
  Administered 2016-05-24: 1 mg via INTRAVENOUS
  Filled 2016-05-24: qty 1

## 2016-05-24 NOTE — ED Triage Notes (Addendum)
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 Pt reports machine weighing approximately 20,000 lbs. No obvious deformity noted to foot, but patient reports severe pain in dorsum of foot and inability to move toes. Sensation and pulse both intact, pt able to move ankle but not his toes. No other signs of injury besides abrasion to L shin. Pt given fentanyl PTA.

## 2016-05-24 NOTE — ED Notes (Signed)
Patient transported to X-ray 

## 2016-05-24 NOTE — ED Notes (Signed)
Pt placed on 2L O2 [place] d/t SpO2 decreasing to 90% following fentanyl administration from EMS.

## 2016-05-24 NOTE — ED Notes (Signed)
Patient verbalized understanding of discharge instructions and denies any further needs or questions at this time. VS stable. Patient ambulatory with use of crutches. RN escorted to ED entrance in wheelchair.

## 2016-05-24 NOTE — Progress Notes (Signed)
Orthopedic Tech Progress Note Patient Details:  Lucas NielsenJoseph E Arnold 10/22/1970 [id number]  Ortho Devices Type of Ortho Device: CAM walker, Crutches Ortho Device/Splint Location: Left foot/leg---- provided cam walker and crutches for pt left foot/leg.  pt tolerated application well.  Pt ambulated well with crutches. pt previously used cam walker and crutches for a previous injury.   Ortho Device/Splint Interventions: Application, Adjustment   Alvina ChouWilliams, Saphyre Cillo C 05/24/2016, 7:08 PM

## 2016-05-24 NOTE — ED Provider Notes (Signed)
[facility] DEPT Provider Note   CSN: [id number] Arrival date & time: 05/24/16  1554     History   Chief Complaint Chief Complaint  Patient presents with  . Foot Pain    HPI [name] is a 46 y.o. male.  Patient with crush injury from hydraulic mechanism from tow truck. Patient got left foot caught under a bed of truck for several seconds and has pain in the left foot and ankle with majority of pain from left big toe. Patient given 200 mg of IV fentanyl with EMS with relief.   The history is provided by the patient and the EMS personnel.  Illness  This is a new problem. The current episode started less than 1 hour ago. The problem occurs constantly. The problem has been gradually improving. Pertinent negatives include no chest pain, no abdominal pain, no headaches and no shortness of breath. Nothing aggravates the symptoms. The symptoms are relieved by narcotics. The treatment provided moderate relief.    Past Medical History:  Diagnosis Date  . Anxiety and depression    2009-suicidal ideation, involuntary commitment  . Congestive heart failure (HCC) ?  Marland Kitchen Coronary artery disease   . Gastroesophageal reflux disease   . History of PSVT (paroxysmal supraventricular tachycardia)    s/p ablation 2006; occasional palpitations; occasional atypical left chest pain; event recorder in 03/2011-normal sinus, sinus tachycardia, single symptomatic spell with sinus rhythm  . Hyperlipidemia    Intolerant to statins causing tongue swelling  . Hyperlipidemia    Lipid profile in 2008:225, 770, 27,?; statin allergy-oral edema  . Hypertension    Lab 02/2011: Normal CBC and BMet, TSH-1.6  . Obstructive sleep apnea    Wears CPAP intermittantly. Followed by Dr. Gildardo Cranker Pulmonology.    Patient Active Problem List   Diagnosis Date Noted  . Hypertension   . History of PSVT (paroxysmal supraventricular tachycardia)   . Obstructive sleep apnea   . Hyperlipidemia   . Gastroesophageal  reflux disease   . Anxiety and depression     Past Surgical History:  Procedure Laterality Date  . ANKLE SURGERY     Right  . LEFT HEART CATHETERIZATION WITH CORONARY ANGIOGRAM N/A 02/21/2011   Procedure: LEFT HEART CATHETERIZATION WITH CORONARY ANGIOGRAM;  Surgeon: Tonny Bollman, MD;  Location: [facility] CATH LAB;  Service: Cardiovascular;  Laterality: N/A;  . NASAL TURBINATE REDUCTION    . SHOULDER SURGERY     Right       Home Medications    Prior to Admission medications   Medication Sig Start Date End Date Taking? Authorizing Provider  budesonide-formoterol (SYMBICORT) 160-4.5 MCG/ACT inhaler Inhale 2 puffs into the lungs 2 (two) times daily.    Historical Provider, MD  citalopram (CELEXA) 20 MG tablet Take 20 mg by mouth daily.    Historical Provider, MD  diazepam (VALIUM) 5 MG tablet Take 1 tablet (5 mg total) by mouth 2 (two) times daily. 10/24/15   Gwyneth Sprout, MD  diclofenac sodium (VOLTAREN) 1 % GEL Apply 2 g topically 4 (four) times daily. 10/24/15   Gwyneth Sprout, MD  esomeprazole (NEXIUM) 40 MG capsule Take 40 mg by mouth daily before breakfast.      Historical Provider, MD  HYDROcodone-acetaminophen (NORCO) 5-325 MG tablet Take 2 tablets by mouth every 4 (four) hours as needed. 05/24/16   Blane Ohara, MD  lamoTRIgine (LAMICTAL) 200 MG tablet Take 200 mg by mouth daily.    Historical Provider, MD  levofloxacin (LEVAQUIN) 750 MG tablet Take 750  mg by mouth daily.    Historical Provider, MD  metoprolol succinate (TOPROL-XL) 100 MG 24 hr tablet Take 100 mg by mouth daily. Take with or immediately following a meal.    Historical Provider, MD  oxyCODONE (OXY IR/ROXICODONE) 5 MG immediate release tablet Take 5-10 mg by mouth every 4 (four) hours as needed for severe pain.    Historical Provider, MD    Family History Family History  Problem Relation Age of Onset  . Heart disease Father   . Heart attack Father   . Heart disease Paternal Grandfather     Social  History Social History  Substance Use Topics  . Smoking status: Current Every Day Smoker    Packs/day: 1.00    Years: 23.00    Types: Cigarettes    Start date: 06/30/1987  . Smokeless tobacco: Never Used  . Alcohol use No     Allergies   Simvastatin   Review of Systems Review of Systems  Constitutional: Negative for chills and fever.  HENT: Negative for ear pain and sore throat.   Eyes: Negative for pain and visual disturbance.  Respiratory: Negative for cough and shortness of breath.   Cardiovascular: Negative for chest pain and palpitations.  Gastrointestinal: Negative for abdominal pain and vomiting.  Genitourinary: Negative for dysuria and hematuria.  Musculoskeletal: Negative for arthralgias and back pain.       Left foot and ankle pain   Skin: Negative for color change and rash.  Neurological: Negative for seizures, syncope and headaches.  All other systems reviewed and are negative.    Physical Exam Updated Vital Signs BP 129/77 (BP Location: Right Arm)   Pulse 78   Temp 98.1 F (36.7 C) (Oral)   Resp 19   Ht 6' (1.829 m)   Wt 106.6 kg   SpO2 95%   BMI 31.87 kg/m   Physical Exam  Constitutional: He appears well-developed and well-nourished.  HENT:  Head: Normocephalic and atraumatic.  Eyes: Conjunctivae are normal. Pupils are equal, round, and reactive to light.  Neck: Normal range of motion. Neck supple.  Cardiovascular: Normal rate, regular rhythm, normal heart sounds and intact distal pulses.   No murmur heard. Pulmonary/Chest: Effort normal and breath sounds normal. No respiratory distress.  Abdominal: Soft. There is no tenderness.  Musculoskeletal: He exhibits tenderness (TTP over left ankle and left foot, most tender in left big toe). He exhibits no edema or deformity.  Decreased ROM of left ankle and toes secondary to pain   Neurological: He is alert.  Skin: Skin is warm and dry. Capillary refill takes less than 2 seconds.  Psychiatric: He has  a normal mood and affect.  Nursing note and vitals reviewed.    ED Treatments / Results  Labs (all labs ordered are listed, but only abnormal results are displayed) Labs Reviewed - No data to display  EKG  EKG Interpretation None       Radiology Dg Ankle Complete Left  Result Date: 05/24/2016 CLINICAL DATA:  Crush injury. EXAM: LEFT ANKLE COMPLETE - 3+ VIEW COMPARISON:  CT 03/26/2013. Tib-fib plain film series 08/22/2010. Left hip series 3 05/2009. Left knee series 3 05/2009. Left ankle series report 06/22/2002. FINDINGS: Avulsion fracture fragment noted along the medial malleolus, age undetermined. Tiny corticated bony density noted along the lateral malleolus/lateral portion of the talus. This most likely represents an old fracture fragment. This was previously described or prior report of 06/22/2002. No other focal bony abnormalities identified. Diffuse mild soft tissue swelling.  No radiopaque foreign body . IMPRESSION: 1. Avulsion fracture fragment noted along the medial malleolus, age undetermined. Tiny corticated bony density noted adjacent to the lateral malleolus/lateral aspect of the talus. This may represent old fracture fragment. This was previously described on prior ankle series report 06/22/2002 . 2. Diffuse mild soft tissue swelling.  No radiopaque foreign body. Electronically Signed   By: Maisie Fus  Register   On: 05/24/2016 16:36   Dg Foot Complete Left  Result Date: 05/24/2016 CLINICAL DATA:  Crush injury. EXAM: LEFT FOOT - COMPLETE 3+ VIEW COMPARISON:  CT 03/26/2013. FINDINGS: No acute bony or joint abnormality identified. Mild degenerative changes first metatarsophalangeal joint. No radiopaque foreign bodies. IMPRESSION: No acute identified.  Mild degenerative changes first MTP joint. Electronically Signed   By: Maisie Fus  Register   On: 05/24/2016 16:30    Procedures Procedures (including critical care time)  Medications Ordered in ED Medications  HYDROmorphone (DILAUDID)  injection 1 mg (1 mg Intravenous Given 05/24/16 1719)     Initial Impression / Assessment and Plan / ED Course  I have reviewed the triage vital signs and the nursing notes.  Pertinent labs & imaging results that were available during my care of the patient were reviewed by me and considered in my medical decision making (see chart for details).    [name] is a 46 year old male with no significant medical history who presents to the ED with crush injury to left foot. Patient's vitals at time of arrival to the ED are unremarkable and patient is without fever. Patient had left foot and ankle crushed by a hydraulic system from tow truck. Mechanism lasted several seconds and patient with left foot and ankle pain since. Pain mostly over his left big toe. Patient with no obvious deformity on examination and is neurovascularly intact. Patient most tender over his left big toe but has tenderness throughout left foot and ankle. Patient with these decreased range of motion secondary to pain. Compartment feels soft. Patient given 200 mg of IV fentanyl by EMS just prior to arrival with improvement of pain. We'll get x-rays of left ankle and foot to evaluate for fracture, malalignment.  Patient with no acute fracture or malalignment in left foot or ankle. X-ray of ankle showed age indeterminate avulsion fracture along the medial malleolus however patient is not tender in this region and likely old. However, patient to be placed in a fracture boot and given crutches and recommend weightbearing to be limited until patient can follow-up with orthopedics outpatient. May need repeat imaging at that time. Patient continued to be neurovasc intact. Recommend Tylenol and Motrin for pain. Patient given a work note and told to return to the ED if symptoms worsen. Discharged in good condition. Pain improved following dilaudid.   I discussed this patient w/ Dr. Jodi Mourning who supervised the care of this patient. I reviewed and  interpreted imaging.  Final Clinical Impressions(s) / ED Diagnoses   Final diagnoses:  Sprain of left foot, initial encounter  Sprain of left ankle, unspecified ligament, initial encounter    New Prescriptions Discharge Medication List as of 05/24/2016  5:21 PM       Virgina Norfolk, DO 05/25/16 0019    Blane Ohara, MD 05/25/16 [id number]

## 2016-06-02 ENCOUNTER — Ambulatory Visit (INDEPENDENT_AMBULATORY_CARE_PROVIDER_SITE_OTHER): Payer: Worker's Compensation

## 2016-06-02 ENCOUNTER — Encounter (HOSPITAL_COMMUNITY): Payer: Self-pay | Admitting: Emergency Medicine

## 2016-06-02 ENCOUNTER — Ambulatory Visit (HOSPITAL_COMMUNITY)
Admission: EM | Admit: 2016-06-02 | Discharge: 2016-06-02 | Disposition: A | Discharge status: Home / Self Care | Payer: Worker's Compensation | Attending: Internal Medicine | Admitting: Internal Medicine

## 2016-06-02 DIAGNOSIS — S8392XA Sprain of unspecified site of left knee, initial encounter: Secondary | ICD-10-CM

## 2016-06-02 DIAGNOSIS — W010XXA Fall on same level from slipping, tripping and stumbling without subsequent striking against object, initial encounter: Secondary | ICD-10-CM | POA: Diagnosis not present

## 2016-06-02 DIAGNOSIS — S8992XA Unspecified injury of left lower leg, initial encounter: Secondary | ICD-10-CM

## 2016-06-02 DIAGNOSIS — S838X2A Sprain of other specified parts of left knee, initial encounter: Secondary | ICD-10-CM

## 2016-06-02 MED ORDER — NAPROXEN 500 MG PO TABS
500.0000 mg | ORAL_TABLET | Freq: Two times a day (BID) | ORAL | 0 refills | Status: DC
Start: 2016-06-02 — End: 2017-08-24

## 2016-06-02 NOTE — ED Triage Notes (Signed)
Around 1:00 pm today patient injured left knee.  Slipped on oil on ground, twisting left knee.  This occurred while on the job.  Patient says supervisor notified

## 2016-06-02 NOTE — ED Provider Notes (Signed)
CSN: [id number]     Arrival date & time 06/02/16  1725 History   None    Chief Complaint  Patient presents with  . 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  He is limping and in moderate amount of pain.   The history is provided by the patient.  Leg Pain  Location:  Knee Time since incident:  1 day Injury: yes   Mechanism of injury: fall   Fall:    Impact surface:  Concrete   Point of impact:  Knees   Entrapped after fall: no   Knee location:  L knee Pain details:    Quality:  Aching   Radiates to:  Does not radiate   Severity:  Moderate   Onset quality:  Sudden   Duration:  1 day   Timing:  Constant   Progression:  Worsening Chronicity:  New Dislocation: no   Foreign body present:  No foreign bodies Tetanus status:  Unknown Prior injury to area:  No Relieved by:  None tried Worsened by:  Bearing weight Ineffective treatments:  None tried Associated symptoms: stiffness     Past Medical History:  Diagnosis Date  . Anxiety and depression    2009-suicidal ideation, involuntary commitment  . Congestive heart failure (HCC) ?  Marland Kitchen Coronary artery disease   . Gastroesophageal reflux disease   . History of PSVT (paroxysmal supraventricular tachycardia)    s/p ablation 2006; occasional palpitations; occasional atypical left chest pain; event recorder in 03/2011-normal sinus, sinus tachycardia, single symptomatic spell with sinus rhythm  . Hyperlipidemia    Intolerant to statins causing tongue swelling  . Hyperlipidemia    Lipid profile in 2008:225, 770, 27,?; statin allergy-oral edema  . Hypertension    Lab 02/2011: Normal CBC and BMet, TSH-1.6  . Obstructive sleep apnea    Wears CPAP intermittantly. Followed by Dr. Gildardo Cranker Pulmonology.   Past Surgical History:   Procedure Laterality Date  . ANKLE SURGERY     Right  . LEFT HEART CATHETERIZATION WITH CORONARY ANGIOGRAM N/A 02/21/2011   Procedure: LEFT HEART CATHETERIZATION WITH CORONARY ANGIOGRAM;  Surgeon: Tonny Bollman, MD;  Location: [facility] CATH LAB;  Service: Cardiovascular;  Laterality: N/A;  . NASAL TURBINATE REDUCTION    . SHOULDER SURGERY     Right   Family History  Problem Relation Age of Onset  . Heart disease Father   . Heart attack Father   . Heart disease Paternal Grandfather    Social History  Substance Use Topics  . Smoking status: Current Every Day Smoker    Packs/day: 1.00    Years: 23.00    Types: Cigarettes    Start date: 06/30/1987  . Smokeless tobacco: Never Used  . Alcohol use No    Review of Systems  Constitutional: Negative.   HENT: Negative.   Eyes: Negative.   Respiratory: Negative.   Cardiovascular: Negative.   Gastrointestinal: Negative.   Endocrine: Negative.   Genitourinary: Negative.   Musculoskeletal: Positive for arthralgias and stiffness.  Allergic/Immunologic: Negative.   Neurological: Negative.   Hematological: Negative.   Psychiatric/Behavioral: Negative.     Allergies  Simvastatin  Home Medications   Prior to Admission medications   Medication Sig Start Date End Date Taking? Authorizing Provider  budesonide-formoterol (SYMBICORT) 160-4.5 MCG/ACT inhaler Inhale 2 puffs into the lungs 2 (two) times daily.  [provider]  citalopram (CELEXA) 20 MG tablet Take 20 mg by mouth daily.    [provider]  diazepam (VALIUM) 5 MG tablet Take 1 tablet (5 mg total) by mouth 2 (two) times daily. 10/24/15   Gwyneth SproutPlunkett, Whitney, MD  diclofenac sodium (VOLTAREN) 1 % GEL Apply 2 g topically 4 (four) times daily. 10/24/15   Gwyneth SproutPlunkett, Whitney, MD  esomeprazole (NEXIUM) 40 MG capsule Take 40 mg by mouth daily before breakfast.      [provider]  HYDROcodone-acetaminophen (NORCO) 5-325 MG tablet Take 2 tablets by mouth every 4  (four) hours as needed. 05/24/16   Blane OharaZavitz, Joshua, MD  lamoTRIgine (LAMICTAL) 200 MG tablet Take 200 mg by mouth daily.    [provider]  levofloxacin (LEVAQUIN) 750 MG tablet Take 750 mg by mouth daily.    [provider]  metoprolol succinate (TOPROL-XL) 100 MG 24 hr tablet Take 100 mg by mouth daily. Take with or immediately following a meal.    [provider]  naproxen (NAPROSYN) 500 MG tablet Take 1 tablet (500 mg total) by mouth 2 (two) times daily with a meal. 06/02/16   Sheron Robin, Anselm PancoastWilliam J, FNP  oxyCODONE (OXY IR/ROXICODONE) 5 MG immediate release tablet Take 5-10 mg by mouth every 4 (four) hours as needed for severe pain.    [provider]   Meds Ordered and Administered this Visit  Medications - No data to display  BP 127/84 (BP Location: Left Arm)   Pulse 73   Temp 98.4 F (36.9 C) (Oral)   Resp 18   SpO2 96%  No data found.   Physical Exam  Constitutional: He appears well-developed and well-nourished.  HENT:  Head: Normocephalic and atraumatic.  Eyes: Conjunctivae and EOM are normal. Pupils are equal, round, and reactive to light.  Neck: Normal range of motion. Neck supple.  Cardiovascular: Normal rate, regular rhythm and normal heart sounds.   Pulmonary/Chest: Effort normal and breath sounds normal.  Abdominal: Soft. Bowel sounds are normal.  Musculoskeletal: He exhibits tenderness.  TTP left patella.  Neg Drawer, Neg varus/valgus strain.  Nursing note and vitals reviewed.   Urgent Care Course     Procedures (including critical care time)  Labs Review Labs Reviewed - No data to display  Imaging Review Dg Knee Complete 4 Views Left  Result Date: 06/02/2016 CLINICAL DATA:  Left knee pain EXAM: LEFT KNEE - COMPLETE 4+ VIEW COMPARISON:  03/26/2013 FINDINGS: No fracture or dislocation is seen. The joint spaces are preserved. Visualized soft tissues are within normal limits. No definite suprapatellar knee effusion. IMPRESSION:  Negative. Electronically Signed   By: Charline BillsSriyesh  Krishnan M.D.   On: 06/02/2016 18:53     Visual Acuity Review  Right Eye Distance:   Left Eye Distance:   Bilateral Distance:    Right Eye Near:   Left Eye Near:    Bilateral Near:         MDM   1. Injury of left knee, initial encounter   2. Sprain of other ligament of left knee, initial encounter    Left knee sleeve Use crutches from home Out of work for next 3 days.   Referral to Ortho  Naprosyn 500mg  one po bid x 10 days #20      Deatra CanterOxford, Mashelle Busick J, OregonFNP 06/02/16 [id number]

## 2016-10-19 ENCOUNTER — Emergency Department (HOSPITAL_COMMUNITY)
Admission: EM | Admit: 2016-10-19 | Discharge: 2016-10-20 | Disposition: A | Discharge status: Home / Self Care | Payer: Self-pay | Attending: Emergency Medicine | Admitting: Emergency Medicine

## 2016-10-19 ENCOUNTER — Emergency Department (HOSPITAL_COMMUNITY): Payer: Self-pay

## 2016-10-19 ENCOUNTER — Encounter (HOSPITAL_COMMUNITY): Payer: Self-pay

## 2016-10-19 DIAGNOSIS — I110 Hypertensive heart disease with heart failure: Secondary | ICD-10-CM | POA: Insufficient documentation

## 2016-10-19 DIAGNOSIS — F17210 Nicotine dependence, cigarettes, uncomplicated: Secondary | ICD-10-CM | POA: Insufficient documentation

## 2016-10-19 DIAGNOSIS — R079 Chest pain, unspecified: Secondary | ICD-10-CM | POA: Insufficient documentation

## 2016-10-19 DIAGNOSIS — Z79899 Other long term (current) drug therapy: Secondary | ICD-10-CM | POA: Insufficient documentation

## 2016-10-19 DIAGNOSIS — Z7982 Long term (current) use of aspirin: Secondary | ICD-10-CM | POA: Insufficient documentation

## 2016-10-19 DIAGNOSIS — I2510 Atherosclerotic heart disease of native coronary artery without angina pectoris: Secondary | ICD-10-CM | POA: Insufficient documentation

## 2016-10-19 DIAGNOSIS — I509 Heart failure, unspecified: Secondary | ICD-10-CM | POA: Insufficient documentation

## 2016-10-19 LAB — CBC
HCT: 44.7 % (ref 39.0–52.0)
HEMOGLOBIN: 15.6 g/dL (ref 13.0–17.0)
MCH: 32.4 pg (ref 26.0–34.0)
MCHC: 34.9 g/dL (ref 30.0–36.0)
MCV: 92.7 fL (ref 78.0–100.0)
PLATELETS: 239 10*3/uL (ref 150–400)
RBC: 4.82 MIL/uL (ref 4.22–5.81)
RDW: 14.1 % (ref 11.5–15.5)
WBC: 11.1 10*3/uL — ABNORMAL HIGH (ref 4.0–10.5)

## 2016-10-19 LAB — I-STAT TROPONIN, ED: TROPONIN I, POC: 0 ng/mL (ref 0.00–0.08)

## 2016-10-19 LAB — BASIC METABOLIC PANEL
ANION GAP: 8 (ref 5–15)
BUN: 9 mg/dL (ref 6–20)
CO2: 19 mmol/L — ABNORMAL LOW (ref 22–32)
Calcium: 8.5 mg/dL — ABNORMAL LOW (ref 8.9–10.3)
Chloride: 103 mmol/L (ref 101–111)
Creatinine, Ser: 1.05 mg/dL (ref 0.61–1.24)
GLUCOSE: 122 mg/dL — AB (ref 65–99)
Potassium: 3.8 mmol/L (ref 3.5–5.1)
Sodium: 130 mmol/L — ABNORMAL LOW (ref 135–145)

## 2016-10-19 NOTE — ED Provider Notes (Signed)
[facility]-EMERGENCY DEPT Provider Note   CSN: [id number] Arrival date & time: 10/19/16  2210   History   Chief Complaint Chief Complaint  Patient presents with  . Chest Pain    HPI  [name] is a 46yo male with PMH significant for PSVT s/p ablation 2006, HTN, HLD, OSA, GERD, and anxiety and depression who presents with left-sided chest pain. He has had intermittent episodes of left-sided chest pain for the last 2 weeks. These episodes last ~2-79min, can happen at any time, and resolve on their own. He thought he had pulled a muscle and has not seen a doctor for this. Today, he was out at a tractor show when he states that he had severe left shoulder pain, which radiated to the neck, jaw, and left arm. Associated SOB. Denies nausea or diaphoresis. His girlfriend gave him some ibuprofen and robaxin, which helped relieve the pain a little. He stopped at a fire station because the pain was so bad and they checked his BP, which was high. He received 4 baby aspirin with some improvement in his pain as well as SL NTG which gave him a headache. Pain is not worse with inspiration. Denies fevers, leg swelling, or recent travel or surgeries. Denies current chest pain.  Has a small nodule on the right side of his head, near his ear that appeared ~4 weeks ago. Patient thought it was a cyst. Went to his primary doctor who thought it was a blood clot. The nodule is painful to the touch and has been bothering the patient a lot. It has increased in size recently as well. Patient states that he has had bad throbbing headaches for the last 4 weeks as well, which he attributes to this nodule on the side of his head. Denies photophobia; associated phonophobia. The headaches resolve after ~3 hours with ibuprofen.  States today is his last day of smoking - has been on chantix. Denies alcohol use or other illicit drugs.  Past Medical History:  Diagnosis Date  . Anxiety and depression    2009-suicidal ideation, involuntary  commitment  . Congestive heart failure (HCC) ?  Marland Kitchen Coronary artery disease   . Gastroesophageal reflux disease   . History of PSVT (paroxysmal supraventricular tachycardia)    s/p ablation 2006; occasional palpitations; occasional atypical left chest pain; event recorder in 03/2011-normal sinus, sinus tachycardia, single symptomatic spell with sinus rhythm  . Hyperlipidemia    Intolerant to statins causing tongue swelling  . Hyperlipidemia    Lipid profile in 2008:225, 770, 27,?; statin allergy-oral edema  . Hypertension    Lab 02/2011: Normal CBC and BMet, TSH-1.6  . Obstructive sleep apnea    Wears CPAP intermittantly. Followed by Dr. Gildardo Cranker Pulmonology.    Patient Active Problem List   Diagnosis Date Noted  . Hypertension   . History of PSVT (paroxysmal supraventricular tachycardia)   . Obstructive sleep apnea   . Hyperlipidemia   . Gastroesophageal reflux disease   . Anxiety and depression     Past Surgical History:  Procedure Laterality Date  . ANKLE SURGERY     Right  . LEFT HEART CATHETERIZATION WITH CORONARY ANGIOGRAM N/A 02/21/2011   Procedure: LEFT HEART CATHETERIZATION WITH CORONARY ANGIOGRAM;  Surgeon: Tonny Bollman, MD;  Location: [facility] CATH LAB;  Service: Cardiovascular;  Laterality: N/A;  . NASAL TURBINATE REDUCTION    . SHOULDER SURGERY     Right       Home Medications    Prior to Admission medications  Medication Sig Start Date End Date Taking? Authorizing Provider  aspirin EC 325 MG tablet Take 325 mg by mouth daily.   Yes [provider]  cephALEXin (KEFLEX) 500 MG capsule Take 500 mg by mouth 3 (three) times daily. FOR 7 DAYS 10/11/16  Yes [provider]  esomeprazole (NEXIUM) 40 MG capsule Take 40 mg by mouth daily before breakfast.     Yes [provider]  ibuprofen (ADVIL,MOTRIN) 200 MG tablet Take 800 mg by mouth every 6 (six) hours as needed (for pain).   Yes [provider]  lamoTRIgine (LAMICTAL) 100 MG  tablet Take 100 mg by mouth daily.   Yes [provider]  metoprolol succinate (TOPROL-XL) 100 MG 24 hr tablet Take 100 mg by mouth daily. Take with or immediately following a meal.   Yes [provider]  naproxen (NAPROSYN) 500 MG tablet Take 1 tablet (500 mg total) by mouth 2 (two) times daily with a meal. 06/02/16  Yes Oxford, Anselm Pancoast, FNP  diazepam (VALIUM) 5 MG tablet Take 1 tablet (5 mg total) by mouth 2 (two) times daily. Patient not taking: Reported on 10/19/2016 10/24/15   Gwyneth Sprout, MD  diclofenac sodium (VOLTAREN) 1 % GEL Apply 2 g topically 4 (four) times daily. Patient not taking: Reported on 10/19/2016 10/24/15   Gwyneth Sprout, MD  HYDROcodone-acetaminophen (NORCO) 5-325 MG tablet Take 2 tablets by mouth every 4 (four) hours as needed. Patient not taking: Reported on 10/19/2016 05/24/16   Blane Ohara, MD    Family History Family History  Problem Relation Age of Onset  . Heart disease Father   . Heart attack Father   . Heart disease Paternal Grandfather     Social History Social History  Substance Use Topics  . Smoking status: Current Every Day Smoker    Packs/day: 1.00    Years: 23.00    Types: Cigarettes    Start date: 06/30/1987  . Smokeless tobacco: Never Used  . Alcohol use No     Allergies   Statins and Simvastatin   Review of Systems Review of Systems  Unremarkable except as stated in history of present illness  Physical Exam Updated Vital Signs There were no vitals taken for this visit.  Physical Exam GEN: Well-appearing, well-nourished male lying in bed. Alert and oriented. No acute distress.  HEAD: 1x1cm small non-fluctuant tender nodule on right side of head, above right ear. RESP: Coarse breath sounds. No wheezes or rales. No increased work of breathing. CV: Normal rate and regular rhythm. No murmurs, gallops, or rubs. No carotid bruits. No LE edema. No tenderness to palpation of anterior chest wall. ABD: Soft.  Non-tender. Non-distended. Normoactive bowel sounds. EXT: No clubbing, cyanosis, or edema. Warm and well perfused. Tenderness to palpation of left trapezius. NEURO: Cranial nerves II-XII grossly intact. Able to lift all four extremities against gravity. Speech fluent and appropriate.  ED Treatments / Results  Labs (all labs ordered are listed, but only abnormal results are displayed) Labs Reviewed  BASIC METABOLIC PANEL - Abnormal; Notable for the following:       Result Value   Sodium 130 (*)    CO2 19 (*)    Glucose, Bld 122 (*)    Calcium 8.5 (*)    All other components within normal limits  CBC - Abnormal; Notable for the following:    WBC 11.1 (*)    All other components within normal limits  I-STAT TROPONIN, ED    EKG  EKG Interpretation  Date/Time:  Saturday October 19 2016 22:29:24 EDT Ventricular Rate:  79 PR Interval:    QRS Duration: 106 QT Interval:  387 QTC Calculation: 444 R Axis:   43 Text Interpretation:  Sinus rhythm RSR' in V1 or V2, right VCD or RVH Normal ECG Confirmed by Gilda Crease [phone]) on 10/19/2016 11:52:39 PM       Radiology Dg Chest 2 View  Result Date: 10/19/2016 CLINICAL DATA:  46 y/o  M; left-sided chest pain. EXAM: CHEST  2 VIEW COMPARISON:  10/24/2015 chest radiograph FINDINGS: The heart size and mediastinal contours are within normal limits. Both lungs are clear. Multiple chronic left posterolateral rib fractures. IMPRESSION: No active cardiopulmonary disease. Electronically Signed   By: Mitzi Hansen M.D.   On: 10/19/2016 23:12    Procedures Procedures (including critical care time)  Medications Ordered in ED Medications - No data to display   Initial Impression / Assessment and Plan / ED Course  I have reviewed the triage vital signs and the nursing notes.  Pertinent labs & imaging results that were available during my care of the patient were reviewed by me and considered in my medical decision making  (see chart for details).  Mr. Kassa is a 46yo male with PMH significant for PSVT s/p ablation 2006, HTN, HLD, OSA, GERD, and anxiety and depression who presents with recurrent left-sided chest pain radiating to neck, left shoulder, and jaw. EKG wnl. CXR with no acute findings. CBC with mildly elevated WBC. Troponin neg x1. BMP with Na 130, bicarb 19. Denies current chest pain. HEART score at least 3. Low suspicion for ACS. Possibly trapezius spasm. Will order delta troponin.  Patient seen and discussed with Dr. Blinda Leatherwood.  Final Clinical Impressions(s) / ED Diagnoses   Final diagnoses:  None    New Prescriptions New Prescriptions   No medications on file     Scherrie Gerlach, MD 10/20/16 0011    Gilda Crease, MD 10/20/16 [phone]

## 2016-10-19 NOTE — ED Triage Notes (Signed)
To room via EMS.  Onset 2 weeks several episodes of left sided chest pain. Earlier today pt had left chest pain, diaphoresis, lasted several minutes.  Onset 9pm left chest pain, no radiation, diaphoresis, N/V.  Pt then started having radiation to left arm, left jaw, pt went to fire station for eval.  Pt now  Having left sided chest pain only.  2/10.  EMS gave ASA 324, NTG x  1.  Pt took Ibuprofen 800 mg  and Robaxin 500 mg @ 9pm thinking it was muscle pain.  After NTG pain decreased from 8/10 to 3/10.  EMS BP 128/86 Hr 80 RR 20 O2 sat 94%

## 2016-10-19 NOTE — ED Notes (Signed)
Patient transported to X-ray 

## 2016-10-20 LAB — I-STAT TROPONIN, ED: TROPONIN I, POC: 0.01 ng/mL (ref 0.00–0.08)

## 2016-10-20 NOTE — Discharge Instructions (Signed)
Take an aspirin daily, follow-up with your primary care doctor and schedule follow-up with cardiology. If you have worsening symptoms, return to the ER for repeat evaluation.

## 2016-10-20 NOTE — ED Notes (Signed)
Pt did not wait for DC instructions or DC vital signs

## 2016-11-08 ENCOUNTER — Emergency Department (HOSPITAL_COMMUNITY): Payer: Self-pay

## 2016-11-08 ENCOUNTER — Encounter (HOSPITAL_COMMUNITY): Payer: Self-pay

## 2016-11-08 ENCOUNTER — Emergency Department (HOSPITAL_COMMUNITY)
Admission: EM | Admit: 2016-11-08 | Discharge: 2016-11-08 | Disposition: A | Discharge status: Home / Self Care | Payer: Self-pay | Attending: Emergency Medicine | Admitting: Emergency Medicine

## 2016-11-08 DIAGNOSIS — Z5321 Procedure and treatment not carried out due to patient leaving prior to being seen by health care provider: Secondary | ICD-10-CM | POA: Insufficient documentation

## 2016-11-08 DIAGNOSIS — R079 Chest pain, unspecified: Secondary | ICD-10-CM | POA: Insufficient documentation

## 2016-11-08 LAB — BASIC METABOLIC PANEL
ANION GAP: 9 (ref 5–15)
BUN: 12 mg/dL (ref 6–20)
CO2: 23 mmol/L (ref 22–32)
Calcium: 9.5 mg/dL (ref 8.9–10.3)
Chloride: 105 mmol/L (ref 101–111)
Creatinine, Ser: 1.02 mg/dL (ref 0.61–1.24)
GFR calc non Af Amer: >60 mL/min (ref >60)
GLUCOSE: 94 mg/dL (ref 65–99)
POTASSIUM: 3.7 mmol/L (ref 3.5–5.1)
Sodium: 137 mmol/L (ref 135–145)

## 2016-11-08 LAB — CBC
HEMATOCRIT: 47.9 % (ref 39.0–52.0)
HEMOGLOBIN: 16.8 g/dL (ref 13.0–17.0)
MCH: 31.9 pg (ref 26.0–34.0)
MCHC: 35.1 g/dL (ref 30.0–36.0)
MCV: 91.1 fL (ref 78.0–100.0)
PLATELETS: 277 10*3/uL (ref 150–400)
RBC: 5.26 MIL/uL (ref 4.22–5.81)
RDW: 13.6 % (ref 11.5–15.5)
WBC: 8.8 10*3/uL (ref 4.0–10.5)

## 2016-11-08 LAB — I-STAT TROPONIN, ED: Troponin i, poc: 0 ng/mL (ref 0.00–0.08)

## 2016-11-08 NOTE — ED Notes (Signed)
No answer x2 

## 2016-11-08 NOTE — ED Triage Notes (Signed)
Pt. Was driving down the highway, and became dizzy, felt syncopal and clammy and sweaty.  Pt. Pulled off the road and called EMS.. Pt. Had a moment of chest pain that lasted about 20 minutes, but has resolved.  Pt. Denies any chest pain at this time.  Pt. Does have mild dizziness.  NIH is negative and [name] is negative.  Pt. Is alert and oriented X4.  Skin is warm, pink and dry.  ECG and labs completed in triage.

## 2016-11-08 NOTE — ED Notes (Signed)
No answer x3

## 2017-02-09 ENCOUNTER — Ambulatory Visit (HOSPITAL_COMMUNITY)
Admission: EM | Admit: 2017-02-09 | Discharge: 2017-02-09 | Disposition: A | Discharge status: Home / Self Care | Payer: Self-pay | Attending: Internal Medicine | Admitting: Internal Medicine

## 2017-02-09 ENCOUNTER — Ambulatory Visit (INDEPENDENT_AMBULATORY_CARE_PROVIDER_SITE_OTHER): Payer: Self-pay

## 2017-02-09 ENCOUNTER — Encounter (HOSPITAL_COMMUNITY): Payer: Self-pay | Admitting: Family Medicine

## 2017-02-09 DIAGNOSIS — R42 Dizziness and giddiness: Secondary | ICD-10-CM

## 2017-02-09 DIAGNOSIS — R0781 Pleurodynia: Secondary | ICD-10-CM

## 2017-02-09 DIAGNOSIS — R0782 Intercostal pain: Secondary | ICD-10-CM

## 2017-02-09 DIAGNOSIS — M542 Cervicalgia: Secondary | ICD-10-CM

## 2017-02-09 MED ORDER — MELOXICAM 7.5 MG PO TABS: 7.5000 mg | ORAL_TABLET | Freq: Every day | ORAL | 0 refills | Status: DC

## 2017-02-09 MED ORDER — CYCLOBENZAPRINE HCL 5 MG PO TABS: 5.0000 mg | ORAL_TABLET | Freq: Every evening | ORAL | 0 refills | Status: DC | PRN
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
## 2017-03-05 DIAGNOSIS — G44209 Tension-type headache, unspecified, not intractable: Secondary | ICD-10-CM | POA: Insufficient documentation

## 2017-03-05 DIAGNOSIS — R202 Paresthesia of skin: Secondary | ICD-10-CM | POA: Insufficient documentation

## 2017-03-05 DIAGNOSIS — M792 Neuralgia and neuritis, unspecified: Secondary | ICD-10-CM | POA: Insufficient documentation

## 2017-03-05 DIAGNOSIS — L729 Follicular cyst of the skin and subcutaneous tissue, unspecified: Secondary | ICD-10-CM | POA: Insufficient documentation

## 2017-08-24 ENCOUNTER — Encounter (HOSPITAL_COMMUNITY): Payer: Self-pay | Admitting: Emergency Medicine

## 2017-08-24 ENCOUNTER — Emergency Department (HOSPITAL_COMMUNITY)
Admission: EM | Admit: 2017-08-24 | Discharge: 2017-08-24 | Discharge status: Against Medical Advice | Payer: Self-pay | Attending: Emergency Medicine | Admitting: Emergency Medicine

## 2017-08-24 DIAGNOSIS — Z79899 Other long term (current) drug therapy: Secondary | ICD-10-CM | POA: Insufficient documentation

## 2017-08-24 DIAGNOSIS — K649 Unspecified hemorrhoids: Secondary | ICD-10-CM | POA: Insufficient documentation

## 2017-08-24 DIAGNOSIS — I509 Heart failure, unspecified: Secondary | ICD-10-CM | POA: Insufficient documentation

## 2017-08-24 DIAGNOSIS — I2510 Atherosclerotic heart disease of native coronary artery without angina pectoris: Secondary | ICD-10-CM | POA: Insufficient documentation

## 2017-08-24 DIAGNOSIS — Z7982 Long term (current) use of aspirin: Secondary | ICD-10-CM | POA: Insufficient documentation

## 2017-08-24 DIAGNOSIS — I110 Hypertensive heart disease with heart failure: Secondary | ICD-10-CM | POA: Insufficient documentation

## 2017-08-24 DIAGNOSIS — F17210 Nicotine dependence, cigarettes, uncomplicated: Secondary | ICD-10-CM | POA: Insufficient documentation

## 2017-08-24 DIAGNOSIS — R202 Paresthesia of skin: Secondary | ICD-10-CM | POA: Insufficient documentation

## 2017-08-24 LAB — BASIC METABOLIC PANEL
ANION GAP: 10 (ref 5–15)
BUN: 10 mg/dL (ref 6–20)
CHLORIDE: 99 mmol/L (ref 98–111)
CO2: 22 mmol/L (ref 22–32)
Calcium: 8.5 mg/dL — ABNORMAL LOW (ref 8.9–10.3)
Creatinine, Ser: 1.11 mg/dL (ref 0.61–1.24)
GFR calc non Af Amer: >60 mL/min (ref >60)
GLUCOSE: 117 mg/dL — AB (ref 70–99)
Potassium: 3.4 mmol/L — ABNORMAL LOW (ref 3.5–5.1)
Sodium: 131 mmol/L — ABNORMAL LOW (ref 135–145)

## 2017-08-24 LAB — CBC
HEMATOCRIT: 47 % (ref 39.0–52.0)
HEMOGLOBIN: 15.8 g/dL (ref 13.0–17.0)
MCH: 30.1 pg (ref 26.0–34.0)
MCHC: 33.6 g/dL (ref 30.0–36.0)
MCV: 89.5 fL (ref 78.0–100.0)
Platelets: 262 10*3/uL (ref 150–400)
RBC: 5.25 MIL/uL (ref 4.22–5.81)
RDW: 13.5 % (ref 11.5–15.5)
WBC: 10.4 10*3/uL (ref 4.0–10.5)

## 2017-08-24 LAB — URINALYSIS, ROUTINE W REFLEX MICROSCOPIC
Bilirubin Urine: NEGATIVE
GLUCOSE, UA: NEGATIVE mg/dL
Hgb urine dipstick: NEGATIVE
Ketones, ur: NEGATIVE mg/dL
LEUKOCYTES UA: NEGATIVE
NITRITE: NEGATIVE
Protein, ur: NEGATIVE mg/dL
SPECIFIC GRAVITY, URINE: 1.006 (ref 1.005–1.030)
pH: 6 (ref 5.0–8.0)

## 2017-08-24 MED ORDER — PREDNISONE 50 MG PO TABS: 50.0000 mg | ORAL_TABLET | Freq: Every day | ORAL | 0 refills | Status: DC

## 2017-08-24 MED ORDER — METHOCARBAMOL 500 MG PO TABS: 500.0000 mg | ORAL_TABLET | Freq: Three times a day (TID) | ORAL | 0 refills | Status: DC | PRN

## 2017-08-24 MED ORDER — HYDROCORTISONE 2.5 % RE CREA: 1.0000 "application " | TOPICAL_CREAM | Freq: Two times a day (BID) | RECTAL | 0 refills | Status: DC

## 2017-08-24 NOTE — Discharge Instructions (Signed)
You were seen in the emergency department for a hemorrhoid as well as for abnormal sensation tot he abdomen and right leg with urinary problems.   Regarding the hemorrhoid- please utilize sitz bath. Please utilize anusol topically 2 times per day. Please see general surgery in 3-5 days for re-evaluation and possible intervention  Regarding numbness, urinary symptoms, and leg pain we had planned for an MRI which you were unable to stay in the ER for. We are unsure of the exact cause. We will try treating this with prednisone and robaxin.  Prednisone is a steroid to help with inflammation, Robaxin as a muscle relaxer to help with spasms.  Take the prednisone daily.  Take Robaxin every 8 hours as needed.  Do not drive operate heavy machinery when taking Robaxin as this is a daily medication.  Do not drink alcohol when taking this medicine keep on the reach of small children.  May take Tylenol and/or Motrin per over-the-counter dosing with these medications.   We have prescribed you new medication(s) today. Discuss the medications prescribed today with your pharmacist as they can have adverse effects and interactions with your other medicines including over the counter and prescribed medications. Seek medical evaluation if you start to experience new or abnormal symptoms after taking one of these medicines, seek care immediately if you start to experience difficulty breathing, feeling of your throat closing, facial swelling, or rash as these could be indications of a more serious allergic reaction   Return to the ER at any time should you wish to have further evaluation.    Return to the ER immediately should you feel that you need to urinate and you are unable to, should you lose control of your urinary or bowel function, return of numbness, weakness, trouble walking, or any other concerns that you may have.  Would like you to follow-up closely with your primary care provider within the next 1 to 3 days  for reevaluation.

## 2017-08-24 NOTE — ED Provider Notes (Signed)
[facility] EMERGENCY DEPARTMENT Provider Note   CSN: [id number] Arrival date & time: 08/24/17  1808     History   Chief Complaint Chief Complaint  Patient presents with  . Hemorrhoids  . Numbness  . Urinary Retention    HPI BRYNDEN THUNE is a 47 y.o. male with a hx of tobacco abuse, anxiety, depression, CAD, HTN, hyperlipidemia, and sleep apnea who presents to the ED with complaints of rectal pain x 6 days as well as paresthesias for past 3 days.   Regarding rectal pain- States that it feels that there is a mass in the rectal area that is progressively worsening is size and discomfort. Rates current pain 7/10 in severity, without specific alleviating/aggrvating factors, has tried OTC Tux cream without relief.  Denies fevers, abdominal, diarrhea, constipation, drainage from rectal area, hematochezia, nausea, or vomiting. Last BM was this AM, normal BM for patient.   Regarding paresthesias- Patient reports paresthesias to pelvic/buttocks area radiating to the posterior proximal aspect of RLE which developed 3 days ago. Reports associated RLE pain and weakness as well as urinary retention. Urinary retention described by the patient as feeling urge to urinate, but being unable to produce more than a few drops of urine with continued urge sensation. His pain in the RLE worsened with walking. Upon arrival to the ED today he has been able to urinate regularly, actually quite frequently, and has had significant change in his paresthesias. Now only having some paresthesias/pain/weakness to proximal posterior RLE, none to pelvis/buttocks area. No other specific alleviating/aggravating factors. Denies incontinence to bowel/bladder, fever, chills, IV drug use, or hx of cancer.  Denies traumatic injury or significant change in activity. Denies nausea, vomiting, dysuria, abdominal pain, or hematuria. Denies leg swelling, recent surgery/trauma, recent long travel, hormone use, personal hx  of cancer, or hx of DVT/PE.      HPI  Past Medical History:  Diagnosis Date  . Anxiety and depression    2009-suicidal ideation, involuntary commitment  . Congestive heart failure (HCC) ?  Marland Kitchen Coronary artery disease   . Gastroesophageal reflux disease   . History of PSVT (paroxysmal supraventricular tachycardia)    s/p ablation 2006; occasional palpitations; occasional atypical left chest pain; event recorder in 03/2011-normal sinus, sinus tachycardia, single symptomatic spell with sinus rhythm  . Hyperlipidemia    Intolerant to statins causing tongue swelling  . Hyperlipidemia    Lipid profile in 2008:225, 770, 27,?; statin allergy-oral edema  . Hypertension    Lab 02/2011: Normal CBC and BMet, TSH-1.6  . Obstructive sleep apnea    Wears CPAP intermittantly. Followed by Dr. Gildardo Cranker Pulmonology.    Patient Active Problem List   Diagnosis Date Noted  . Hypertension   . History of PSVT (paroxysmal supraventricular tachycardia)   . Obstructive sleep apnea   . Hyperlipidemia   . Gastroesophageal reflux disease   . Anxiety and depression     Past Surgical History:  Procedure Laterality Date  . ANKLE SURGERY     Right  . LEFT HEART CATHETERIZATION WITH CORONARY ANGIOGRAM N/A 02/21/2011   Procedure: LEFT HEART CATHETERIZATION WITH CORONARY ANGIOGRAM;  Surgeon: Tonny Bollman, MD;  Location: [facility] CATH LAB;  Service: Cardiovascular;  Laterality: N/A;  . NASAL TURBINATE REDUCTION    . SHOULDER SURGERY     Right        Home Medications    Prior to Admission medications   Medication Sig Start Date End Date Taking? Authorizing Provider  aspirin EC 325  MG tablet Take 325 mg by mouth daily.    [provider]  cephALEXin (KEFLEX) 500 MG capsule Take 500 mg by mouth 3 (three) times daily. FOR 7 DAYS 10/11/16   [provider]  cyclobenzaprine (FLEXERIL) 5 MG tablet Take 1-2 tablets (5-10 mg total) by mouth at bedtime as needed for muscle spasms. 02/09/17   Cathie Hoops,  Amy V, PA-C  diazepam (VALIUM) 5 MG tablet Take 1 tablet (5 mg total) by mouth 2 (two) times daily. Patient not taking: Reported on 10/19/2016 10/24/15   Gwyneth Sprout, MD  diclofenac sodium (VOLTAREN) 1 % GEL Apply 2 g topically 4 (four) times daily. Patient not taking: Reported on 10/19/2016 10/24/15   Gwyneth Sprout, MD  esomeprazole (NEXIUM) 40 MG capsule Take 40 mg by mouth daily before breakfast.      [provider]  HYDROcodone-acetaminophen (NORCO) 5-325 MG tablet Take 2 tablets by mouth every 4 (four) hours as needed. Patient not taking: Reported on 10/19/2016 05/24/16   Blane Ohara, MD  ibuprofen (ADVIL,MOTRIN) 200 MG tablet Take 800 mg by mouth every 6 (six) hours as needed (for pain).    [provider]  lamoTRIgine (LAMICTAL) 100 MG tablet Take 100 mg by mouth daily.    [provider]  meloxicam (MOBIC) 7.5 MG tablet Take 1 tablet (7.5 mg total) by mouth daily. 02/09/17   Cathie Hoops, Amy V, PA-C  metoprolol succinate (TOPROL-XL) 100 MG 24 hr tablet Take 100 mg by mouth daily. Take with or immediately following a meal.    [provider]  naproxen (NAPROSYN) 500 MG tablet Take 1 tablet (500 mg total) by mouth 2 (two) times daily with a meal. 06/02/16   [place], Anselm Pancoast, FNP    Family History Family History  Problem Relation Age of Onset  . Heart disease Father   . Heart attack Father   . Heart disease Paternal Grandfather     Social History Social History   Tobacco Use  . Smoking status: Current Every Day Smoker    Packs/day: 1.00    Years: 23.00    Pack years: 23.00    Types: Cigarettes    Start date: 06/30/1987  . Smokeless tobacco: Never Used  Substance Use Topics  . Alcohol use: No    Alcohol/week: 0.0 oz  . Drug use: No     Allergies   Statins and Simvastatin   Review of Systems Review of Systems  All other systems reviewed and are negative.    Physical Exam Updated Vital Signs BP 122/84   Pulse (!) 57   Temp 98.1  F (36.7 C) (Oral)   Resp 18   Ht 6' (1.829 m)   Wt 102.1 kg (225 lb)   SpO2 95%   BMI 30.52 kg/m   Physical Exam  Constitutional: He appears well-developed and well-nourished.  Non-toxic appearance. No distress.  HENT:  Head: Normocephalic and atraumatic.  Eyes: Conjunctivae are normal. Right eye exhibits no discharge. Left eye exhibits no discharge.  Cardiovascular: Normal rate and regular rhythm.  No murmur heard. Pulses:      Dorsalis pedis pulses are 2+ on the right side, and 2+ on the left side.       Posterior tibial pulses are 2+ on the right side, and 2+ on the left side.  Pulmonary/Chest: Effort normal and breath sounds normal. No respiratory distress. He has no wheezes. He has no rhonchi. He has no rales.  Abdominal: Soft. He exhibits no distension. There is tenderness (  mild) in the suprapubic area. There is no rigidity, no rebound and no guarding.  Genitourinary: Rectal exam shows external hemorrhoid (Moderate to large size hemorrhoid measuring approximately 2 cm, slightly discolored with bluish tint, tender to palpation). Rectal exam shows anal tone normal. Internal hemorrhoid: Moderate to large size hemorrhoid measuring approximately 2 cm, slightly discolored with bluish tint, tender to palpation.  Genitourinary Comments: No palpable fluctuance.  No active drainage.  Anal tone is intact.  EDT present as chaperone.  Musculoskeletal:  No obvious deformity, appreciable swelling, edema, erythema, ecchymosis, or open wounds. Upper extremities: Normal range of motion.  Nontender.   Back: No midline tenderness to palpation.  No paraspinal muscle tenderness palpation.  No gluteal tenderness to palpation.  No palpable step-off or crepitus. Lower extremities: Patient has normal range of motion to bilateral hips, knees, ankles and all digits.  His lower extremities are nontender to palpation.  Compartments are soft.   Neurological: He is alert.  Clear speech.  CN III through XII  grossly intact.  5 out of 5 symmetric grip strength.  5 out of 5 strength with plantar dorsiflexion bilaterally.  5 out of 5 strength with hip flexion and extension bilaterally.  Sensation grossly intact to bilateral upper and lower extremities.  Patellar DTRs are 2+ and symmetric.  Gait is steady and intact, no foot drop noted.  Skin: Skin is warm and dry. No rash noted.  Psychiatric: He has a normal mood and affect. His behavior is normal.  Nursing note and vitals reviewed.   ED Treatments / Results  Labs Results for orders placed or performed during the hospital encounter of 08/24/17  Urinalysis, Routine w reflex microscopic- may I&O cath if menses  Result Value Ref Range   Color, Urine YELLOW YELLOW   APPearance CLEAR CLEAR   Specific Gravity, Urine 1.006 1.005 - 1.030   pH 6.0 5.0 - 8.0   Glucose, UA NEGATIVE NEGATIVE mg/dL   Hgb urine dipstick NEGATIVE NEGATIVE   Bilirubin Urine NEGATIVE NEGATIVE   Ketones, ur NEGATIVE NEGATIVE mg/dL   Protein, ur NEGATIVE NEGATIVE mg/dL   Nitrite NEGATIVE NEGATIVE   Leukocytes, UA NEGATIVE NEGATIVE  Basic metabolic panel  Result Value Ref Range   Sodium 131 (L) 135 - 145 mmol/L   Potassium 3.4 (L) 3.5 - 5.1 mmol/L   Chloride 99 98 - 111 mmol/L   CO2 22 22 - 32 mmol/L   Glucose, Bld 117 (H) 70 - 99 mg/dL   BUN 10 6 - 20 mg/dL   Creatinine, Ser 4.09 0.61 - 1.24 mg/dL   Calcium 8.5 (L) 8.9 - 10.3 mg/dL   GFR calc non Af Amer >60 >60 mL/min   GFR calc Af Amer >60 >60 mL/min   Anion gap 10 5 - 15  CBC  Result Value Ref Range   WBC 10.4 4.0 - 10.5 K/uL   RBC 5.25 4.22 - 5.81 MIL/uL   Hemoglobin 15.8 13.0 - 17.0 g/dL   HCT 81.1 91.4 - 78.2 %   MCV 89.5 78.0 - 100.0 fL   MCH 30.1 26.0 - 34.0 pg   MCHC 33.6 30.0 - 36.0 g/dL   RDW 95.6 21.3 - 08.6 %   Platelets 262 150 - 400 K/uL   No results found. EKG None  Radiology No results found.  Procedures Procedures (including critical care time)  Medications Ordered in ED Medications  - No data to display   Initial Impression / Assessment and Plan / ED Course  I have  reviewed the triage vital signs and the nursing notes.  Pertinent labs & imaging results that were available during my care of the patient were reviewed by me and considered in my medical decision making (see chart for details).   Patient presents to the emergency department with hemorrhoid, paresthesias, and urinary retention.  Patient nontoxic-appearing, resting comfortably, vitals without significant abnormality.  -Regarding rectal discomfort it appears patient has a somewhat thrombosed hemorrhoid on exam, this is tender to palpation-we will provide topical analgesia, recommended sitz bath, general surgery follow-up.  -Regarding paresthesias and urinary symptoms-this seems to be improving in the emergency department. He has a fairly benign exam without focal neurologic deficits and with steady gait. DDX: sciatica, electrolyte disturbance, infectious etiology, cauda equina.   Electrolytes with mild hypocalcemia at 8.5, mild hypokalemia at 3.4, and mild hyponatremia at 131. No anemia. No leukocytosis, UA WNL, no infection. Anal tone intact, post void bladder scan with 39 ccs per EDT. Given patient's provided history cauda equina remains on differential despite re-assuring exam- discussed with supervising physician Dr. Adela LankFloyd in agreement for MRI for further evaluation- he recommends T/L spine wo contrast. Upon further discussion with the patient he wishes to leave prior to MRI- patient signed out AGAINST MEDICAL ADVICE: The patient and I discussed the nature and purpose, risks and benefits, as well as, the alternatives of treatment. Time was given to allow the opportunity to ask questions and consider options. After the discussion, the patient decided to refuse the offerred treatment. The patient was informed that refusal could lead to, but was not limited to, death, permanent disability, or severe pain. Prior to  refusing, I determined that the patient had the capacity to make their decision and understood the consequences of that decision. After refusal, I made every reasonable effort to treat them to the best of my ability.  The patient was notified that they may return to the emergency department at any time for further treatment.    Trial of prednisone and robaxin provided- instructed patient not to drive or operate heavy machinery when taking robaxin. Instructed patient he may return to ER at any time, also discussed close PCP follow up. I discussed results during visit, treatment plan, need for PCP follow-up, and strict return precautions with the patient. Provided opportunity for questions, patient confirmed understanding.   Findings and plan of care discussed with supervising physician Dr. Suezanne CheshireFloyd-in agreement.  Final Clinical Impressions(s) / ED Diagnoses   Final diagnoses:  Hemorrhoids, unspecified hemorrhoid type  Paresthesia    ED Discharge Orders        Ordered    predniSONE (DELTASONE) 50 MG tablet  Daily with breakfast     08/24/17 2312    methocarbamol (ROBAXIN) 500 MG tablet  Every 8 hours PRN     08/24/17 2312    hydrocortisone (ANUSOL-HC) 2.5 % rectal cream  2 times daily     08/24/17 2312       Briante Loveall, ParadiseSamantha R, PA-C 08/25/17 0229    Melene PlanFloyd, Dan, DO 08/26/17 1911

## 2017-08-24 NOTE — ED Triage Notes (Signed)
Pt presents with hemorroid since Friday reports size of golf ball, cannot sit on bottom; pt also reporting numbness around waist and pain that radiates down R leg with tingling; pt states back pain that has been ongoing with no known injury; affecting urination  (lack of because of numbness)

## 2017-10-21 IMAGING — CR DG SHOULDER 2+V*L*
3 series · 3 of 3 positions shown · non-contrast
Comparison: None.

CLINICAL DATA: Left shoulder pain with movement for 4 days. No
known injury. Initial encounter.

EXAM:
LEFT SHOULDER - 2+ VIEW

[shoulder grashey]
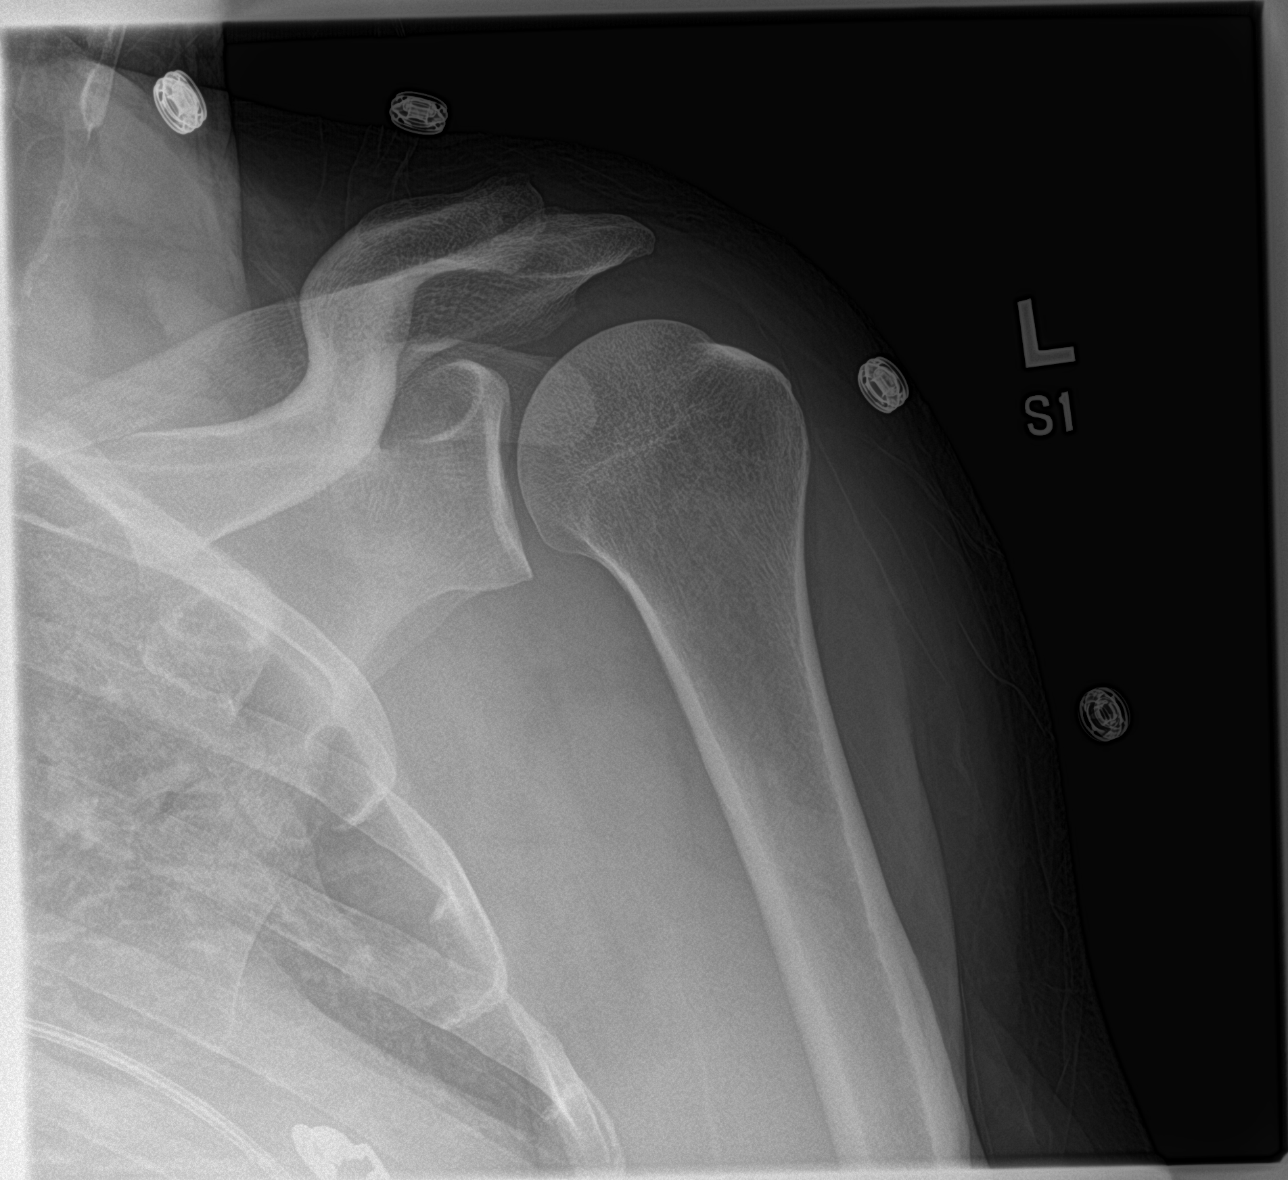

[shoulder y view]
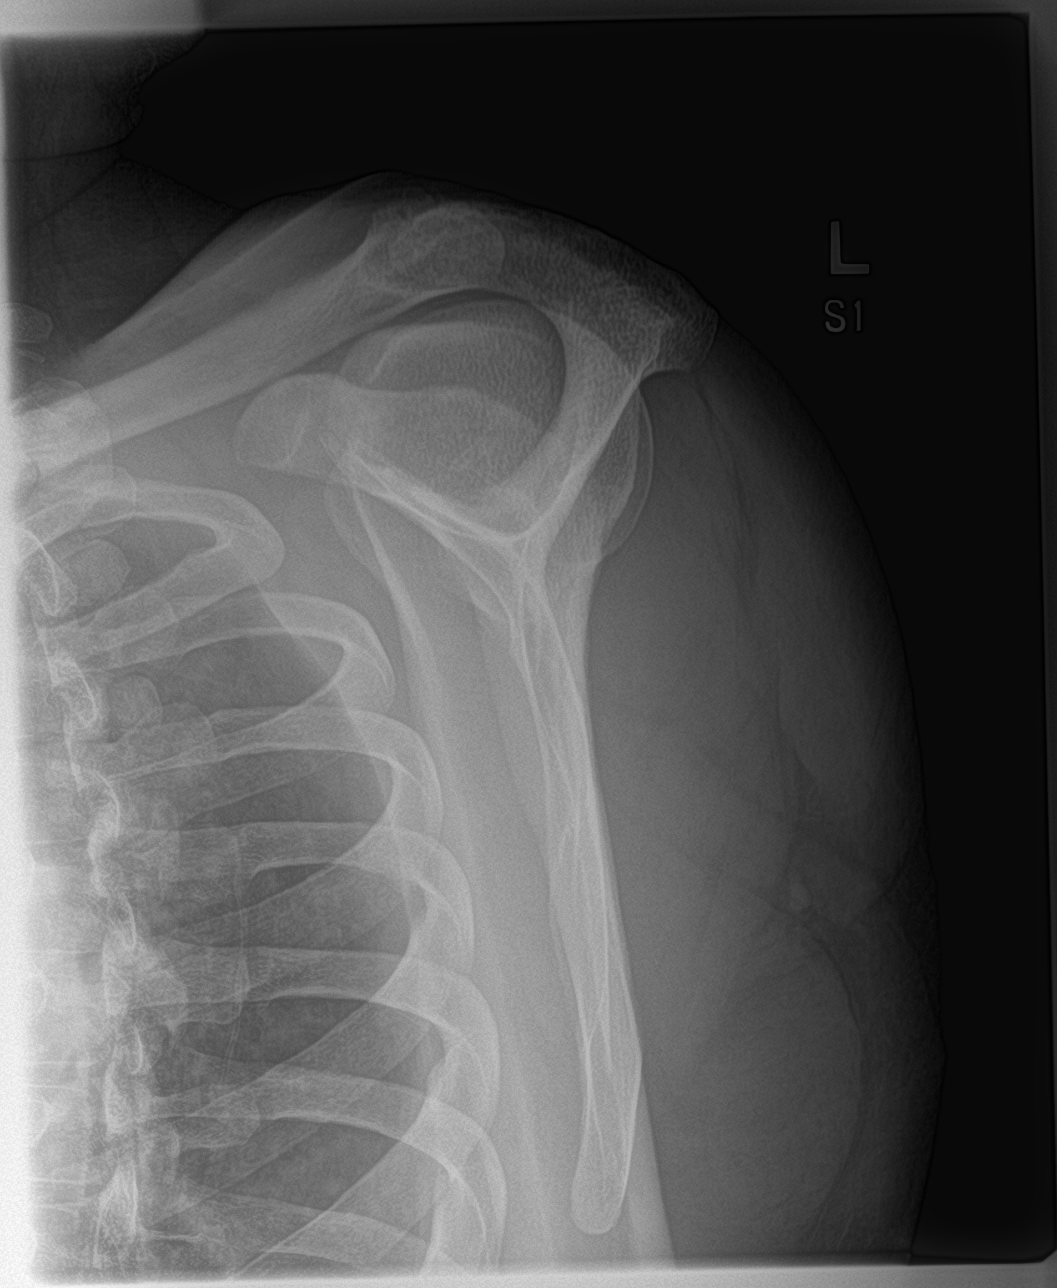

[shoulder axillary]
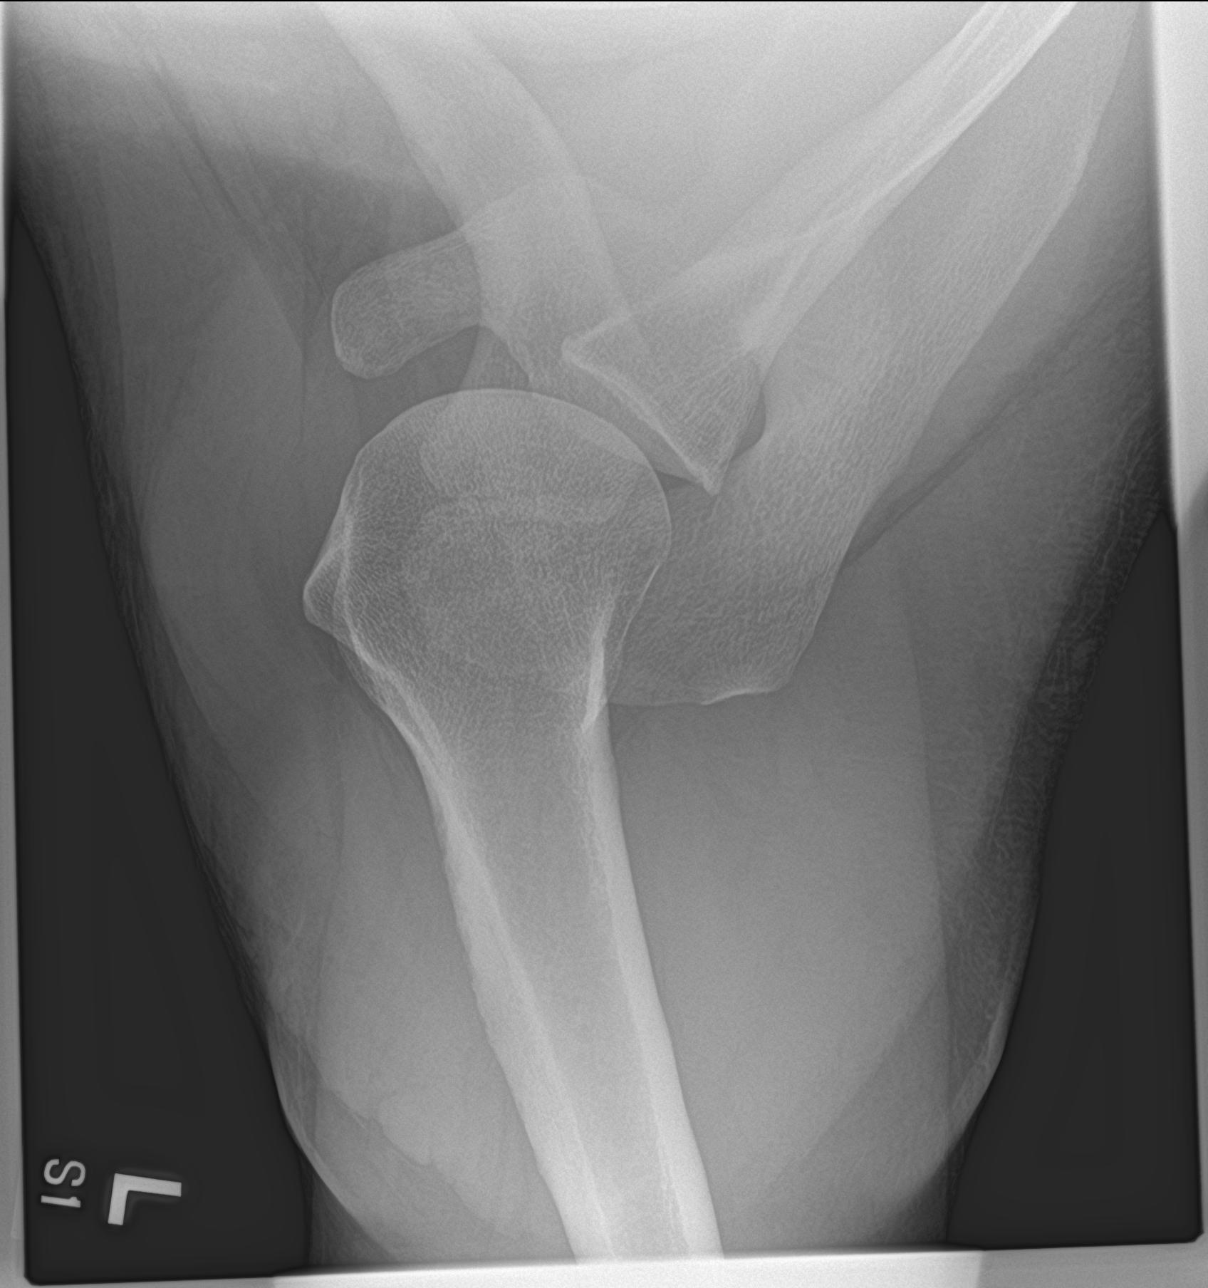

[3 of 3 positions shown; findings below may reference images not displayed]

FINDINGS: There is no evidence of fracture or dislocation. There is no focal
bone abnormality. Mild acromioclavicular osteoarthritis is noted.
Soft tissues are unremarkable.
IMPRESSION: No acute abnormality.

Mild acromioclavicular osteoarthritis.

## 2017-10-21 IMAGING — DX DG CHEST 2V
2 series · 2 of 2 positions shown · non-contrast
Comparison: CT chest 10/23/2015.  Chest 02/01/2014

CLINICAL DATA: Mid chest pain, nausea, left shoulder and arm pain
tonight.

EXAM:
CHEST  2 VIEW

[chest pa]
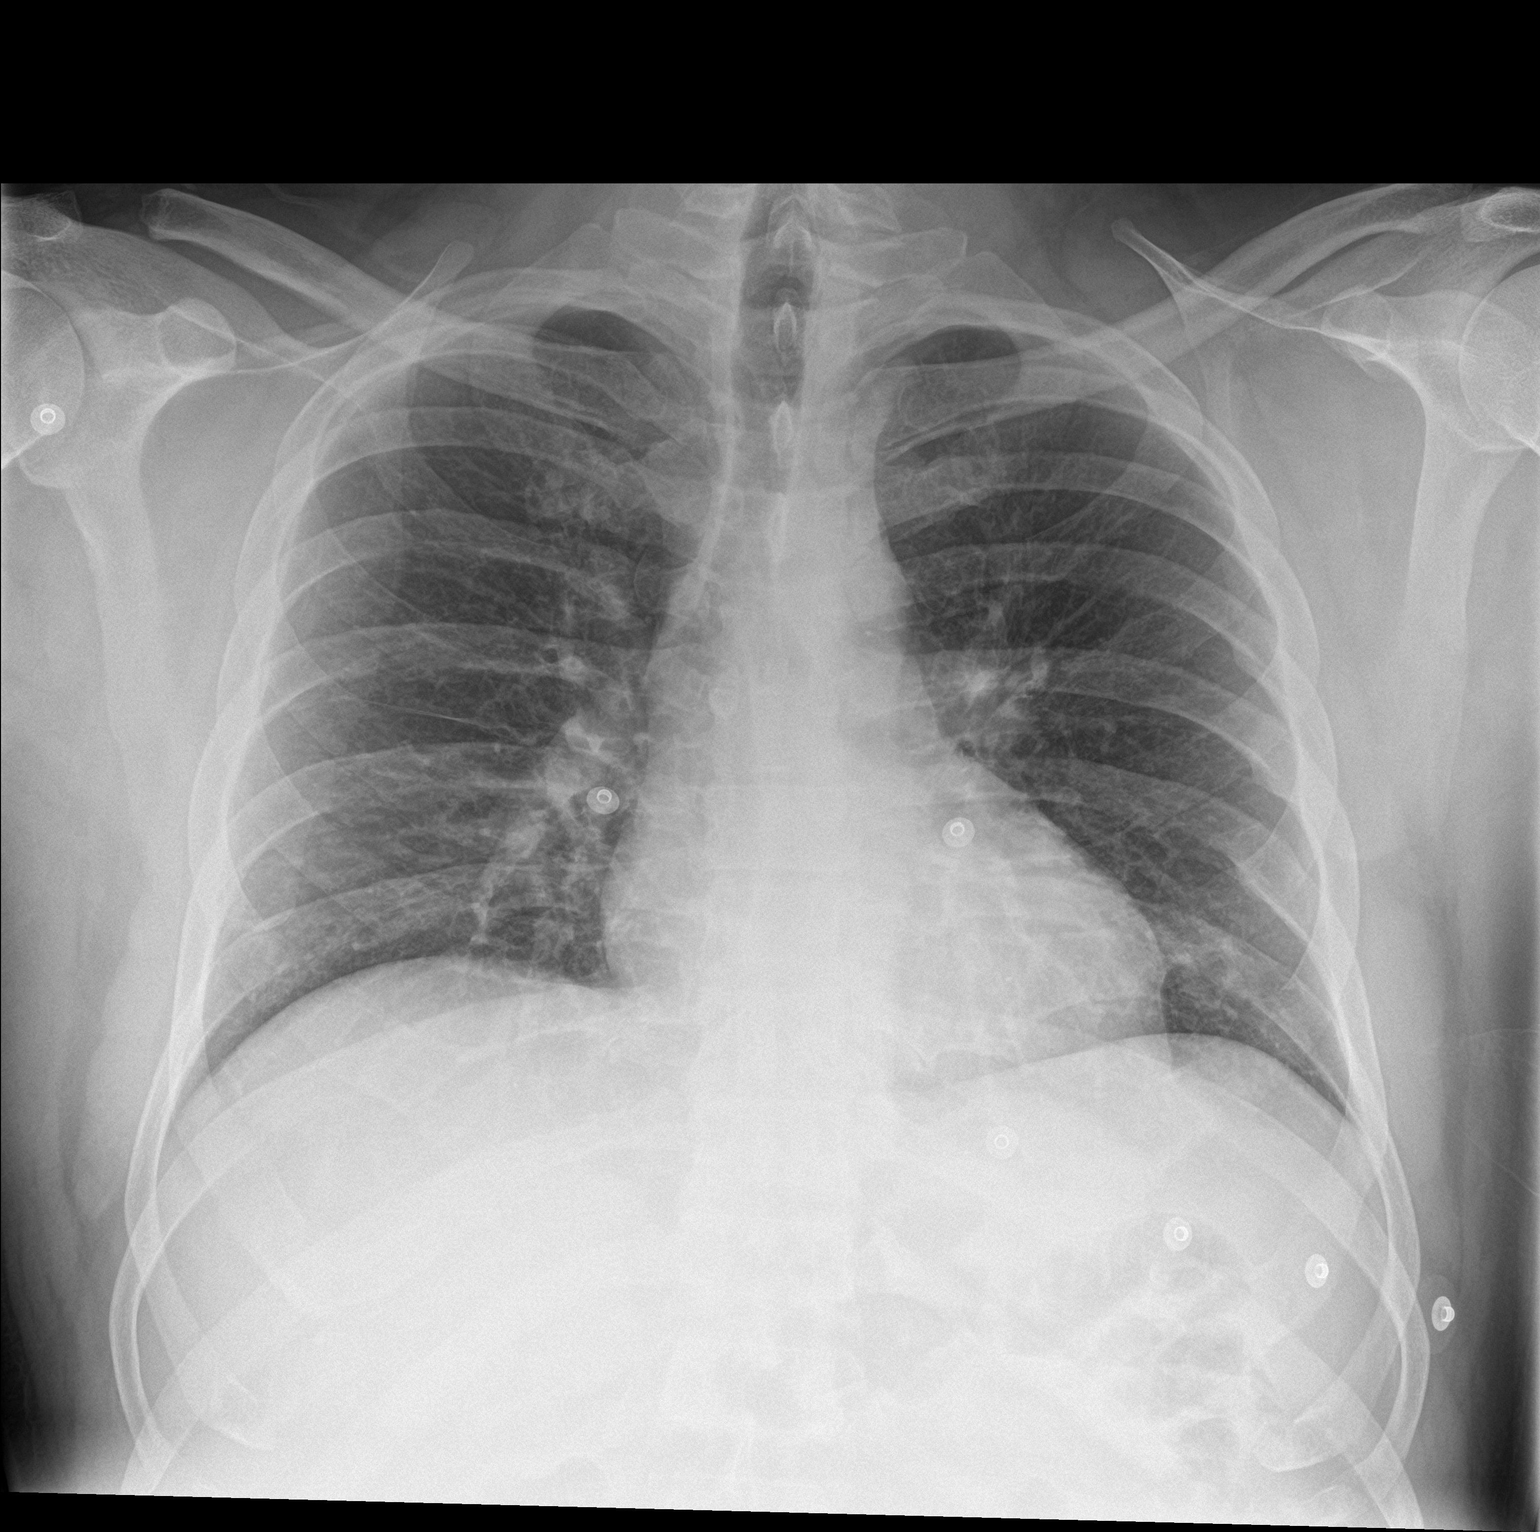

[chest lat]
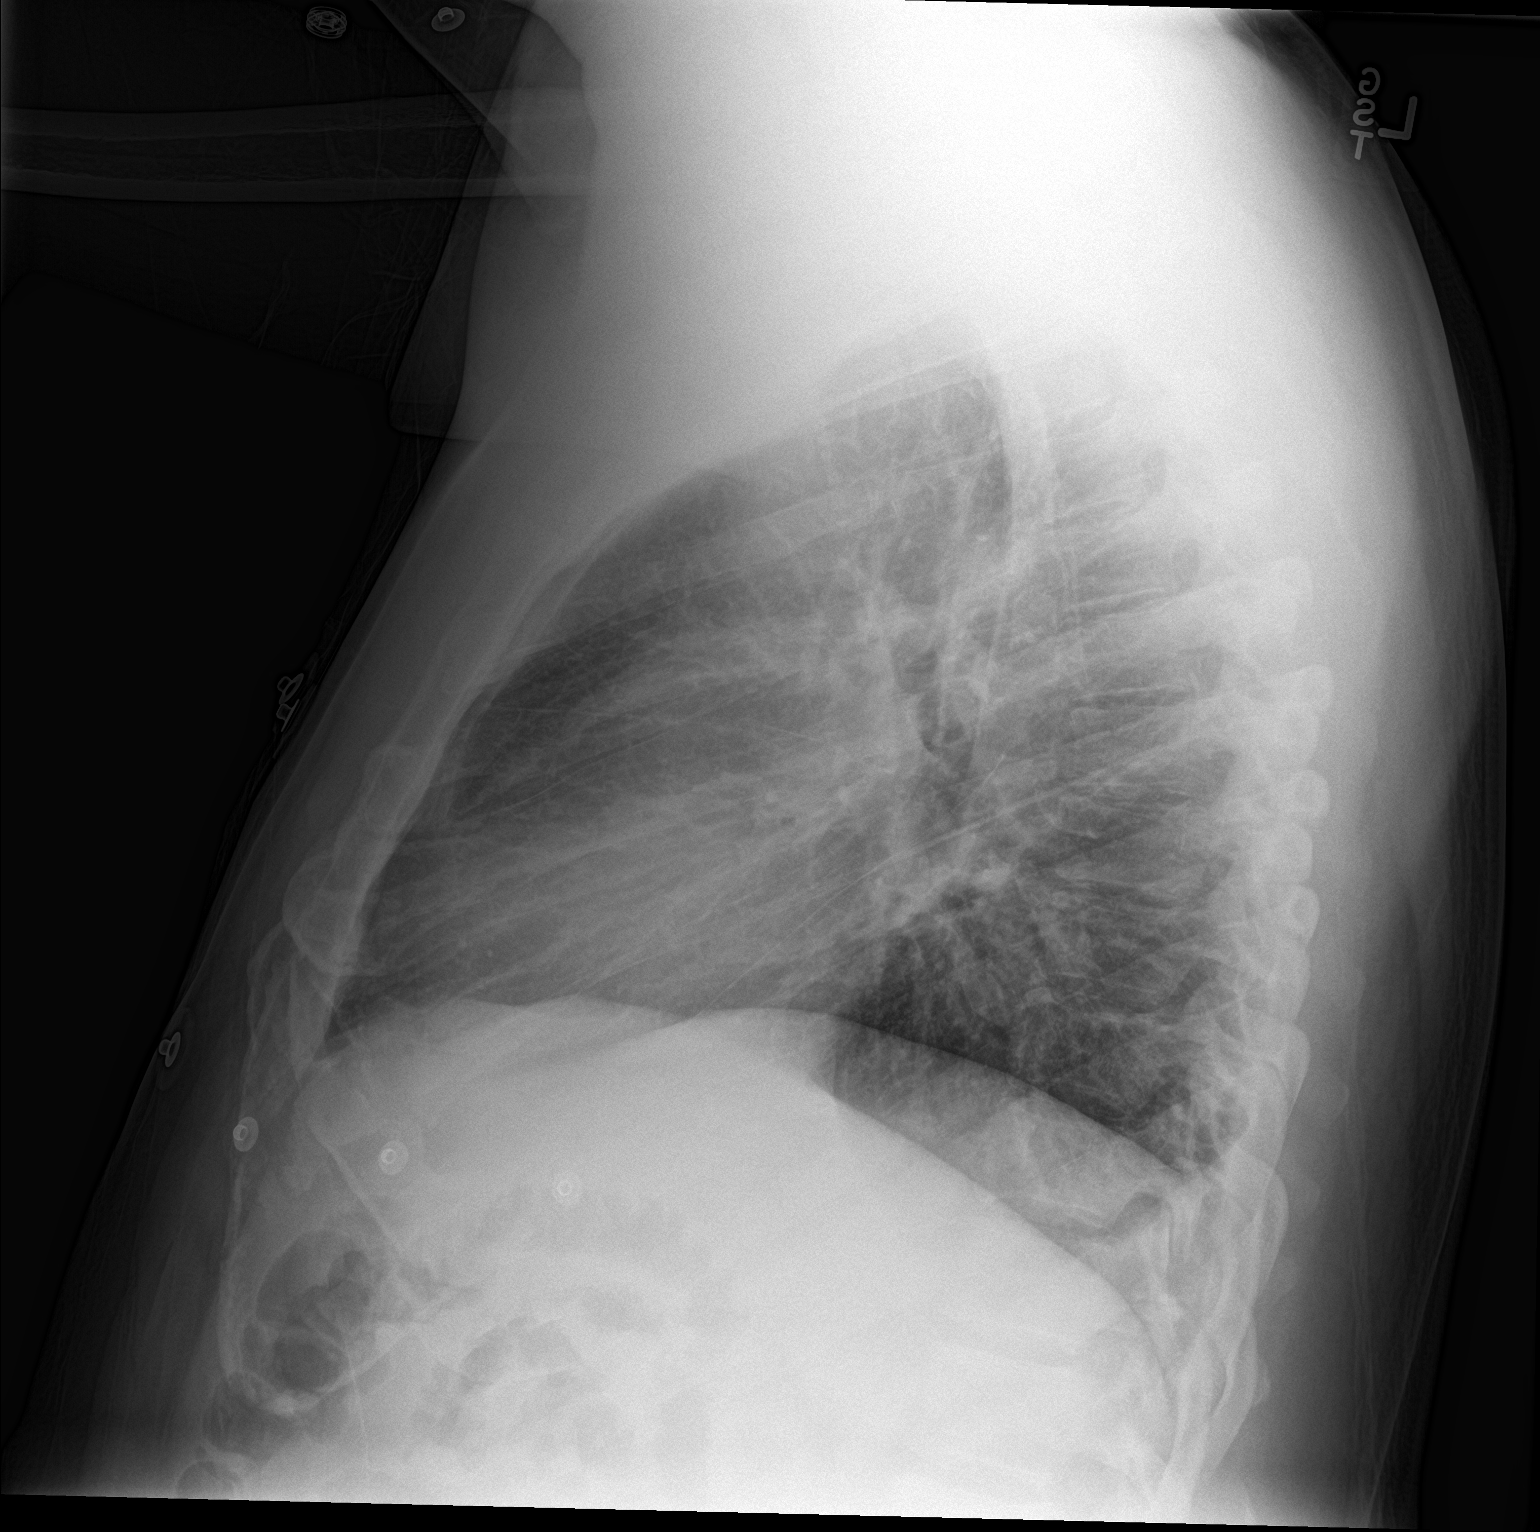

[2 of 2 positions shown; findings below may reference images not displayed]

FINDINGS: Normal heart size and pulmonary vascularity. No focal airspace
disease or consolidation in the lungs. No blunting of costophrenic
angles. No pneumothorax. Mediastinal contours appear intact. Old
left rib fractures.
IMPRESSION: No active cardiopulmonary disease.

## 2018-03-03 ENCOUNTER — Encounter (HOSPITAL_COMMUNITY): Payer: Self-pay | Admitting: *Deleted

## 2018-03-03 ENCOUNTER — Emergency Department (HOSPITAL_COMMUNITY)
Admission: EM | Admit: 2018-03-03 | Discharge: 2018-03-03 | Disposition: A | Discharge status: Home / Self Care | Payer: Self-pay | Attending: Emergency Medicine | Admitting: Emergency Medicine

## 2018-03-03 ENCOUNTER — Other Ambulatory Visit: Payer: Self-pay

## 2018-03-03 ENCOUNTER — Emergency Department (HOSPITAL_COMMUNITY): Payer: Self-pay

## 2018-03-03 DIAGNOSIS — I110 Hypertensive heart disease with heart failure: Secondary | ICD-10-CM | POA: Insufficient documentation

## 2018-03-03 DIAGNOSIS — I509 Heart failure, unspecified: Secondary | ICD-10-CM | POA: Insufficient documentation

## 2018-03-03 DIAGNOSIS — R55 Syncope and collapse: Secondary | ICD-10-CM | POA: Insufficient documentation

## 2018-03-03 DIAGNOSIS — Z79899 Other long term (current) drug therapy: Secondary | ICD-10-CM | POA: Insufficient documentation

## 2018-03-03 DIAGNOSIS — F17210 Nicotine dependence, cigarettes, uncomplicated: Secondary | ICD-10-CM | POA: Insufficient documentation

## 2018-03-03 LAB — CBG MONITORING, ED: Glucose-Capillary: 113 mg/dL — ABNORMAL HIGH (ref 70–99)

## 2018-03-03 LAB — BASIC METABOLIC PANEL
Anion gap: 11 (ref 5–15)
BUN: 12 mg/dL (ref 6–20)
CHLORIDE: 106 mmol/L (ref 98–111)
CO2: 23 mmol/L (ref 22–32)
CREATININE: 1.24 mg/dL (ref 0.61–1.24)
Calcium: 9.4 mg/dL (ref 8.9–10.3)
GFR calc Af Amer: >60 mL/min (ref >60)
Glucose, Bld: 122 mg/dL — ABNORMAL HIGH (ref 70–99)
Potassium: 5.1 mmol/L (ref 3.5–5.1)
SODIUM: 140 mmol/L (ref 135–145)

## 2018-03-03 LAB — CBC
HEMATOCRIT: 51.7 % (ref 39.0–52.0)
Hemoglobin: 17.2 g/dL — ABNORMAL HIGH (ref 13.0–17.0)
MCH: 30.3 pg (ref 26.0–34.0)
MCHC: 33.3 g/dL (ref 30.0–36.0)
MCV: 91.2 fL (ref 80.0–100.0)
Platelets: 277 10*3/uL (ref 150–400)
RBC: 5.67 MIL/uL (ref 4.22–5.81)
RDW: 13.3 % (ref 11.5–15.5)
WBC: 9.1 10*3/uL (ref 4.0–10.5)
nRBC: 0 % (ref 0.0–0.2)

## 2018-03-03 MED ORDER — SODIUM CHLORIDE 0.9 % IV BOLUS
1000.0000 mL | Freq: Once | INTRAVENOUS | Status: AC
  Administered 2018-03-03: 1000 mL via INTRAVENOUS

## 2018-03-03 MED ORDER — SODIUM CHLORIDE 0.9% FLUSH: 3.0000 mL | Freq: Once | INTRAVENOUS | Status: DC

## 2018-03-03 NOTE — ED Triage Notes (Signed)
Pt in from dental office via [facility], pt had a syncopal episode after having tooth extracted using lidocaine and epi, pt had witnessed syncope per staff, pt reported to have HR in the 30s by fire, no tongue injury, no bowel or bladder incontinence, pt A&O x4, SB

## 2018-03-03 NOTE — ED Provider Notes (Signed)
[facility] Note   CSN: [id number] Arrival date & time: 03/03/18  [id number]     History   Chief Complaint Chief Complaint  Patient presents with  . Loss of Consciousness    HPI Lucas NielsenJoseph E Doukas is a 48 y.o. male.  Patient with history of PSVT, high blood pressure, high cholesterol, normal echocardiogram in 2015 --presents to the emergency department with complaint of syncope versus seizure during a dental extraction this morning.  Patient states that he did not have anything to eat or drink other than coffee.  He took his metoprolol this morning.  Per EMS report, patient had a shaking spell at the end of his procedure.  Questionable confusion right afterwards.  Patient did not bite his tongue or have any urinary incontinence.  Patient has returned to his baseline and currently feels well.  Per EMS, when fire arrived on scene heart rate was in the 30s.  Patient states that the dentist told him that he reported feeling hot prior to the episode.  No previous history of seizures.  Patient has had syncopal episodes in the past.  No history of reactions to anesthetics.  The onset of this condition was acute. The course is resolved. Aggravating factors: none. Alleviating factors: none.       Past Medical History:  Diagnosis Date  . Anxiety and depression    2009-suicidal ideation, involuntary commitment  . Congestive heart failure (HCC) ?  Marland Kitchen. Coronary artery disease   . Gastroesophageal reflux disease   . History of PSVT (paroxysmal supraventricular tachycardia)    s/p ablation 2006; occasional palpitations; occasional atypical left chest pain; event recorder in 03/2011-normal sinus, sinus tachycardia, single symptomatic spell with sinus rhythm  . Hyperlipidemia    Intolerant to statins causing tongue swelling  . Hyperlipidemia    Lipid profile in 2008:225, 770, 27,?; statin allergy-oral edema  . Hypertension    Lab 02/2011: Normal CBC and BMet,  TSH-1.6  . Obstructive sleep apnea    Wears CPAP intermittantly. Followed by Dr[facility].    Patient Active Problem List   Diagnosis Date Noted  . Hypertension   . History of PSVT (paroxysmal supraventricular tachycardia)   . Obstructive sleep apnea   . Hyperlipidemia   . Gastroesophageal reflux disease   . Anxiety and depression     Past Surgical History:  Procedure Laterality Date  . ANKLE SURGERY     Right  . LEFT HEART CATHETERIZATION WITH CORONARY ANGIOGRAM N/A 02/21/2011   Procedure: LEFT HEART CATHETERIZATION WITH CORONARY ANGIOGRAM;  Surgeon: Tonny BollmanMichael Cooper, MD;  [facility];  Service: Cardiovascular;  Laterality: N/A;  . NASAL TURBINATE REDUCTION    . SHOULDER SURGERY     Right        Home Medications    Prior to Admission medications   Medication Sig Start Date End Date Taking? Authorizing Provider  esomeprazole (NEXIUM) 40 MG capsule Take 40 mg by mouth daily before breakfast.     Yes [provider]  ibuprofen (ADVIL,MOTRIN) 200 MG tablet Take 800 mg by mouth every 6 (six) hours as needed (for pain).    Yes [provider]  lamoTRIgine (LAMICTAL) 100 MG tablet Take 100 mg by mouth daily.   Yes [provider]  metoprolol succinate (TOPROL-XL) 50 MG 24 hr tablet Take 50 mg by mouth daily. Take with or immediately following a meal.    Yes [provider]  hydrocortisone (ANUSOL-HC) 2.5 % rectal  cream Place 1 application rectally 2 (two) times daily. Patient not taking: Reported on 03/03/2018 08/24/17   Petrucelli, Pleas KochSamantha R, PA-C  methocarbamol (ROBAXIN) 500 MG tablet Take 1 tablet (500 mg total) by mouth every 8 (eight) hours as needed. Patient not taking: Reported on 03/03/2018 08/24/17   Petrucelli, Pleas KochSamantha R, PA-C  predniSONE (DELTASONE) 50 MG tablet Take 1 tablet (50 mg total) by mouth daily with breakfast. Patient not taking: Reported on 03/03/2018 08/24/17   Petrucelli, Pleas KochSamantha R, PA-C    Family  History Family History  Problem Relation Age of Onset  . Heart disease Father   . Heart attack Father   . Heart disease Paternal Grandfather     Social History Social History   Tobacco Use  . Smoking status: Current Every Day Smoker    Packs/day: 1.00    Years: 23.00    Pack years: 23.00    Types: Cigarettes    Start date: 06/30/1987  . Smokeless tobacco: Never Used  Substance Use Topics  . Alcohol use: No    Alcohol/week: 0.0 standard drinks  . Drug use: No     Allergies   Statins and Simvastatin   Review of Systems Review of Systems  Constitutional: Negative for diaphoresis and fever.  Eyes: Negative for redness.  Respiratory: Negative for cough and shortness of breath.   Cardiovascular: Negative for chest pain, palpitations and leg swelling.  Gastrointestinal: Negative for abdominal pain, nausea and vomiting.  Genitourinary: Negative for dysuria and enuresis.  Musculoskeletal: Negative for back pain and neck pain.  Skin: Negative for rash.  Neurological: Positive for syncope. Negative for light-headedness.  Psychiatric/Behavioral: The patient is not nervous/anxious.      Physical Exam Updated Vital Signs BP (!) 130/91   Pulse (!) 55   Temp 97.7 F (36.5 C) (Oral)   Resp 15   SpO2 98%   Physical Exam Vitals signs and nursing note reviewed.  Constitutional:      Appearance: He is well-developed.  HENT:     Head: Normocephalic and atraumatic.     Right Ear: Tympanic membrane, ear canal and external ear normal.     Left Ear: Tympanic membrane, ear canal and external ear normal.     Nose: Nose normal.     Mouth/Throat:     Mouth: Mucous membranes are moist.     Pharynx: Uvula midline.     Comments: Dried blood noted on the lips but no lateral tongue trauma.  Patient had right mandibular molar extracted just prior to arrival and suspect the bleeding is from this. Eyes:     General: Lids are normal.        Right eye: No discharge.        Left eye: No  discharge.     Conjunctiva/sclera: Conjunctivae normal.     Pupils: Pupils are equal, round, and reactive to light.  Neck:     Musculoskeletal: Normal range of motion and neck supple.  Cardiovascular:     Rate and Rhythm: Normal rate and regular rhythm.     Heart sounds: Normal heart sounds.  Pulmonary:     Effort: Pulmonary effort is normal.     Breath sounds: Normal breath sounds.  Abdominal:     Palpations: Abdomen is soft.     Tenderness: There is no abdominal tenderness.  Musculoskeletal: Normal range of motion.     Cervical back: He exhibits normal range of motion, no tenderness and no bony tenderness.  Skin:    General:  Skin is warm and dry.  Neurological:     Mental Status: He is alert and oriented to person, place, and time.     GCS: GCS eye subscore is 4. GCS verbal subscore is 5. GCS motor subscore is 6.     Cranial Nerves: No cranial nerve deficit.     Sensory: No sensory deficit.     Motor: No abnormal muscle tone.     Coordination: Coordination normal.     Gait: Gait normal.     Deep Tendon Reflexes: Reflexes are normal and symmetric.      ED Treatments / Results  Labs (all labs ordered are listed, but only abnormal results are displayed) Labs Reviewed  BASIC METABOLIC PANEL - Abnormal; Notable for the following components:      Result Value   Glucose, Bld 122 (*)    All other components within normal limits  CBC - Abnormal; Notable for the following components:   Hemoglobin 17.2 (*)    All other components within normal limits  CBG MONITORING, ED - Abnormal; Notable for the following components:   Glucose-Capillary 113 (*)    All other components within normal limits  URINALYSIS, ROUTINE W REFLEX MICROSCOPIC    EKG EKG Interpretation  Date/Time:  Tuesday March 03 2018 08:58:51 EST Ventricular Rate:  59 PR Interval:  178 QRS Duration: 98 QT Interval:  432 QTC Calculation: 427 R Axis:   44 Text Interpretation:  Sinus bradycardia Incomplete  right bundle branch block Borderline ECG When compared to prior, no significant changes seen.  No STEMI Confirmed by Theda Belfast ([id number]) on 03/03/2018 9:08:18 AM   Radiology Dg Chest 2 View  Result Date: 03/03/2018 CLINICAL DATA:  Syncope, seizure today while in a dental chair EXAM: CHEST - 2 VIEW COMPARISON:  02/10/2015 FINDINGS: Lordotic positioning. Upper normal heart size likely accentuated by lordosis. Mediastinal contours and pulmonary vascularity normal. Minimal chronic peribronchial thickening. Lungs clear. No pulmonary infiltrate, pleural effusion, or pneumothorax. Old LEFT rib fractures. IMPRESSION: No acute abnormalities. Minimal chronic bronchitic changes. Electronically Signed   By: Ulyses Southward M.D.   On: 03/03/2018 10:05    Procedures Procedures (including critical care time)  Medications Ordered in ED Medications  sodium chloride flush (NS) 0.9 % injection 3 mL (3 mLs Intravenous Not Given 03/03/18 1021)  sodium chloride 0.9 % bolus 1,000 mL (0 mLs Intravenous Stopped 03/03/18 1208)     Initial Impression / Assessment and Plan / ED Course  I have reviewed the triage vital signs and the nursing notes.  Pertinent labs & imaging results that were available during my care of the patient were reviewed by me and considered in my medical decision making (see chart for details).     Patient seen and examined.  Patient with elements that are suggestive of seizure and syncope.  Will evaluate with blood work, EKG, chest x-ray.  Vital signs reviewed and are as follows: BP 126/87   Pulse (!) 59   Temp 97.7 F (36.5 C) (Oral)   Resp (!) 21   SpO2 95%   Patient discussed with and seen by Dr. Rush Landmark.  Patient cleared for discharged home at this time.  Work-up is reassuring.  Patient will continue good hydration at home and follow-up with PCP.  Encouraged return to the emergency department for further work-up with any additional lightheadedness, syncope, or seizure-like  activity.   Final Clinical Impressions(s) / ED Diagnoses   Final diagnoses:  Syncope, unspecified syncope type   Patient with  what is most likely a syncopal episode but seizure activity cannot be ruled out.  Patient had positive prodrome during a dental procedure.  Reported shaking type activity however do not know if this was related to hypoxia or seizure-like activity.  Patient is back to his baseline without any neuro deficits.  Head CT deferred at this time.  Patient is back to baseline and will be discharged home with PCP follow-up as above.  Return instructions as above.    ED Discharge Orders    None       Renne Crigler, PA-C 03/03/18 1249    Tegeler, Canary Brim, MD 03/03/18 [phone]

## 2018-03-03 NOTE — ED Notes (Signed)
Pt was upset about being discharged. Left without paperwork. Stated "the doctor said I could go". IV removed

## 2018-03-03 NOTE — ED Notes (Signed)
Pt noted as having dried blood at lips. Pt denies biting tongue. States he just haD TOOTH EXTRACTION.

## 2018-03-03 NOTE — Discharge Instructions (Signed)
Please read and follow all provided instructions.  Your diagnoses today include:  1. Syncope, unspecified syncope type    Tests performed today include:  An EKG of your heart  A chest x-ray  Cardiac enzymes - a blood test for heart muscle damage  Blood counts and electrolytes  Vital signs. See below for your results today.   Medications prescribed:   None  Take any prescribed medications only as directed.  Follow-up instructions: Please follow-up with your primary care provider as soon as you can for further evaluation of your symptoms.   Return instructions:  SEEK IMMEDIATE MEDICAL ATTENTION IF:  You have severe chest pain, especially if the pain is crushing or pressure-like and spreads to the arms, back, neck, or jaw, or if you have sweating, nausea (feeling sick to your stomach), or shortness of breath. THIS IS AN EMERGENCY. Don't wait to see if the pain will go away. Get medical help at once. Call 911 or 0 (operator). DO NOT drive yourself to the hospital.   Your chest pain gets worse and does not go away with rest.   You wake from sleep with chest pain or shortness of breath.  You feel dizzy or have additional episodes of fainting.  You have chest pain not typical of your usual pain for which you originally saw your caregiver.   You have any other emergent concerns regarding your health.  Your vital signs today were: BP 126/87    Pulse (!) 59    Temp 97.7 F (36.5 C) (Oral)    Resp (!) 21    SpO2 95%  If your blood pressure (BP) was elevated above 135/85 this visit, please have this repeated by your doctor within one month. --------------

## 2018-03-06 ENCOUNTER — Other Ambulatory Visit (HOSPITAL_COMMUNITY): Payer: Self-pay | Admitting: Family Medicine

## 2018-03-06 DIAGNOSIS — R569 Unspecified convulsions: Secondary | ICD-10-CM

## 2018-03-10 ENCOUNTER — Ambulatory Visit (HOSPITAL_COMMUNITY)
Admission: RE | Admit: 2018-03-10 | Discharge: 2018-03-10 | Disposition: A | Discharge status: Home / Self Care | Payer: Self-pay | Source: Ambulatory Visit | Attending: Family Medicine | Admitting: Family Medicine

## 2018-03-10 DIAGNOSIS — R569 Unspecified convulsions: Secondary | ICD-10-CM | POA: Insufficient documentation

## 2018-03-10 DIAGNOSIS — R55 Syncope and collapse: Secondary | ICD-10-CM

## 2018-03-10 NOTE — Procedures (Signed)
History: 48 yo M being evaluated for syncope vs seizure  Sedation: None  Technique: This is a 21 channel routine scalp EEG performed at the bedside with bipolar and monopolar montages arranged in accordance to the international 10/20 system of electrode placement. One channel was dedicated to EKG recording.    Background: The background consists of intermixed alpha and beta activities. There is a well defined posterior dominant rhythm of 8 Hz that attenuates with eye opening. Sleep is recorded with normal appearing structures.   Photic stimulation: Physiologic driving is present Hyperventilation was performed without epileptiform discharges  EEG Abnormalities: None  Clinical Interpretation: This normal EEG is recorded in the waking and sleep state. There was no seizure or seizure predisposition recorded on this study. Please note that lack of epileptiform activity on EEG does not preclude the possibility of epilepsy.   Ritta Slot, MD [facility] [phone]  If 7pm- 7am, please page neurology on call as listed in [facility]ION.

## 2018-03-10 NOTE — Progress Notes (Signed)
EEG completed; results pending.    

## 2018-03-11 ENCOUNTER — Ambulatory Visit (HOSPITAL_COMMUNITY)
Admission: RE | Admit: 2018-03-11 | Discharge: 2018-03-11 | Disposition: A | Discharge status: Home / Self Care | Payer: Self-pay | Source: Ambulatory Visit | Attending: Family Medicine | Admitting: Family Medicine

## 2018-03-11 DIAGNOSIS — R569 Unspecified convulsions: Secondary | ICD-10-CM

## 2018-10-10 ENCOUNTER — Emergency Department (HOSPITAL_COMMUNITY)
Admission: EM | Admit: 2018-10-10 | Discharge: 2018-10-11 | Discharge status: Absent without leave | Payer: BC Managed Care – PPO

## 2018-10-10 ENCOUNTER — Other Ambulatory Visit: Payer: Self-pay

## 2020-04-16 ENCOUNTER — Other Ambulatory Visit: Payer: Self-pay

## 2020-04-16 ENCOUNTER — Encounter (HOSPITAL_COMMUNITY): Payer: Self-pay | Admitting: Emergency Medicine

## 2020-04-16 ENCOUNTER — Emergency Department (HOSPITAL_COMMUNITY): Payer: Self-pay

## 2020-04-16 ENCOUNTER — Emergency Department (HOSPITAL_COMMUNITY)
Admission: EM | Admit: 2020-04-16 | Discharge: 2020-04-16 | Disposition: A | Discharge status: Home / Self Care | Payer: Self-pay | Attending: Emergency Medicine | Admitting: Emergency Medicine

## 2020-04-16 DIAGNOSIS — R519 Headache, unspecified: Secondary | ICD-10-CM

## 2020-04-16 DIAGNOSIS — I509 Heart failure, unspecified: Secondary | ICD-10-CM | POA: Insufficient documentation

## 2020-04-16 DIAGNOSIS — Z79899 Other long term (current) drug therapy: Secondary | ICD-10-CM | POA: Insufficient documentation

## 2020-04-16 DIAGNOSIS — I2510 Atherosclerotic heart disease of native coronary artery without angina pectoris: Secondary | ICD-10-CM | POA: Insufficient documentation

## 2020-04-16 DIAGNOSIS — I110 Hypertensive heart disease with heart failure: Secondary | ICD-10-CM | POA: Insufficient documentation

## 2020-04-16 DIAGNOSIS — F17210 Nicotine dependence, cigarettes, uncomplicated: Secondary | ICD-10-CM | POA: Insufficient documentation

## 2020-04-16 DIAGNOSIS — I10 Essential (primary) hypertension: Secondary | ICD-10-CM

## 2020-04-16 LAB — COMPREHENSIVE METABOLIC PANEL
ALT: 23 U/L (ref 0–44)
AST: 23 U/L (ref 15–41)
Albumin: 4.2 g/dL (ref 3.5–5.0)
Alkaline Phosphatase: 88 U/L (ref 38–126)
Anion gap: 9 (ref 5–15)
BUN: 8 mg/dL (ref 6–20)
CO2: 25 mmol/L (ref 22–32)
Calcium: 9.1 mg/dL (ref 8.9–10.3)
Chloride: 102 mmol/L (ref 98–111)
Creatinine, Ser: 1.02 mg/dL (ref 0.61–1.24)
GFR, Estimated: >60 mL/min (ref >60)
Glucose, Bld: 99 mg/dL (ref 70–99)
Potassium: 4.1 mmol/L (ref 3.5–5.1)
Sodium: 136 mmol/L (ref 135–145)
Total Bilirubin: 0.3 mg/dL (ref 0.3–1.2)
Total Protein: 7.8 g/dL (ref 6.5–8.1)

## 2020-04-16 LAB — URINALYSIS, ROUTINE W REFLEX MICROSCOPIC
Bacteria, UA: NONE SEEN
Bilirubin Urine: NEGATIVE
Glucose, UA: NEGATIVE mg/dL
Ketones, ur: NEGATIVE mg/dL
Leukocytes,Ua: NEGATIVE
Nitrite: NEGATIVE
Protein, ur: NEGATIVE mg/dL
Specific Gravity, Urine: 1.036 — ABNORMAL HIGH (ref 1.005–1.030)
pH: 6 (ref 5.0–8.0)

## 2020-04-16 LAB — DIFFERENTIAL
Abs Immature Granulocytes: 0.03 10*3/uL (ref 0.00–0.07)
Basophils Absolute: 0.1 10*3/uL (ref 0.0–0.1)
Basophils Relative: 1 %
Eosinophils Absolute: 0.2 10*3/uL (ref 0.0–0.5)
Eosinophils Relative: 3 %
Immature Granulocytes: 0 %
Lymphocytes Relative: 39 %
Lymphs Abs: 3.4 10*3/uL (ref 0.7–4.0)
Monocytes Absolute: 0.6 10*3/uL (ref 0.1–1.0)
Monocytes Relative: 7 %
Neutro Abs: 4.4 10*3/uL (ref 1.7–7.7)
Neutrophils Relative %: 50 %

## 2020-04-16 LAB — CBC
HCT: 51.6 % (ref 39.0–52.0)
Hemoglobin: 17.4 g/dL — ABNORMAL HIGH (ref 13.0–17.0)
MCH: 31 pg (ref 26.0–34.0)
MCHC: 33.7 g/dL (ref 30.0–36.0)
MCV: 91.8 fL (ref 80.0–100.0)
Platelets: 359 10*3/uL (ref 150–400)
RBC: 5.62 MIL/uL (ref 4.22–5.81)
RDW: 13.3 % (ref 11.5–15.5)
WBC: 8.7 10*3/uL (ref 4.0–10.5)
nRBC: 0 % (ref 0.0–0.2)

## 2020-04-16 LAB — PROTIME-INR
INR: 0.9 (ref 0.8–1.2)
Prothrombin Time: 11.8 seconds (ref 11.4–15.2)

## 2020-04-16 LAB — I-STAT CHEM 8, ED
BUN: 9 mg/dL (ref 6–20)
Calcium, Ion: 1.2 mmol/L (ref 1.15–1.40)
Chloride: 103 mmol/L (ref 98–111)
Creatinine, Ser: 1.1 mg/dL (ref 0.61–1.24)
Glucose, Bld: 93 mg/dL (ref 70–99)
HCT: 51 % (ref 39.0–52.0)
Hemoglobin: 17.3 g/dL — ABNORMAL HIGH (ref 13.0–17.0)
Potassium: 4.4 mmol/L (ref 3.5–5.1)
Sodium: 140 mmol/L (ref 135–145)
TCO2: 29 mmol/L (ref 22–32)

## 2020-04-16 LAB — RAPID URINE DRUG SCREEN, HOSP PERFORMED
Amphetamines: NOT DETECTED
Barbiturates: NOT DETECTED
Benzodiazepines: NOT DETECTED
Cocaine: NOT DETECTED
Opiates: NOT DETECTED
Tetrahydrocannabinol: NOT DETECTED

## 2020-04-16 LAB — CBG MONITORING, ED: Glucose-Capillary: 108 mg/dL — ABNORMAL HIGH (ref 70–99)

## 2020-04-16 LAB — APTT: aPTT: 28 seconds (ref 24–36)

## 2020-04-16 LAB — ETHANOL: Alcohol, Ethyl (B): <10 mg/dL (ref <10)

## 2020-04-16 MED ORDER — IOHEXOL 350 MG/ML SOLN
75.0000 mL | Freq: Once | INTRAVENOUS | Status: AC | PRN
  Administered 2020-04-16: 75 mL via INTRAVENOUS

## 2020-04-16 NOTE — Discharge Instructions (Signed)
As we talked about if you develop severe or worsening neurologic symptoms such as slurred speech, facial droop or weakness in the arms or the legs I would like for you to return to the emergency department immediately.  Otherwise you may continue to take ibuprofen as needed up to 400 mg 3 times a day at most, you may try to alternate this with Tylenol.  Your CT scans and blood work today did not show any specific abnormalities which is great news, that being said these symptoms may be related to headaches that are coming and going and thus I would like for you to see the neurologist, Dr. Gerilyn Pilgrim, please see his phone number above.  He can help you work through these headaches so that they do not come anymore.

## 2020-04-16 NOTE — ED Notes (Signed)
Pt back from CT

## 2020-04-16 NOTE — ED Triage Notes (Addendum)
Pt was watching TV when his right eye went burry and he began to have pressure on the right side of his head.  LKW 1330  Pt has no obvious facial droop, has no trouble speaking, is not confused, denies weakness to arms or legs.  Stroke workup initiated- code stroke not called.

## 2020-04-16 NOTE — ED Notes (Addendum)
Pt brought to CT by RN per Dr. Darlyn Chamber request.

## 2020-04-16 NOTE — ED Provider Notes (Addendum)
[facility] EMERGENCY DEPARTMENT Provider Note   CSN: [id number] Arrival date & time: 04/16/20  1345     History Chief Complaint  Patient presents with  . Cerebrovascular Accident    [name] is a 50 y.o. male.  Patient with sudden onset at 130 of right-sided headache described as pressure and some blurred vision in the right eye.  No right eye pain.  No trouble with vision in the left eye.  No trouble with speech.  No facial weakness or droop no weakness to arms and legs and no numbness to arms and legs.  Patient felt fine earlier today.  Patient does have a history of hypertension.  As well as hyperlipidemia hypertension as mentioned congestive heart failure and coronary artery disease and gastroesophageal reflux disease.  And has had a past history of PSVT.  Appears the patient's had a left heart catheterization.  We will look further into chart review to see if we can determine what the findings were.  No nausea or vomiting patient denies any history of migraines.  Blood pressure elevated here with systolic of 178.        Past Medical History:  Diagnosis Date  . Anxiety and depression    2009-suicidal ideation, involuntary commitment  . Congestive heart failure (HCC) ?  Marland Kitchen Coronary artery disease   . Gastroesophageal reflux disease   . History of PSVT (paroxysmal supraventricular tachycardia)    s/p ablation 2006; occasional palpitations; occasional atypical left chest pain; event recorder in 03/2011-normal sinus, sinus tachycardia, single symptomatic spell with sinus rhythm  . Hyperlipidemia    Intolerant to statins causing tongue swelling  . Hyperlipidemia    Lipid profile in 2008:225, 770, 27,?; statin allergy-oral edema  . Hypertension    Lab 02/2011: Normal CBC and BMet, TSH-1.6  . Obstructive sleep apnea    Wears CPAP intermittantly. Followed by Dr. [facility].    Patient Active Problem List   Diagnosis Date Noted  . Hypertension   . History  of PSVT (paroxysmal supraventricular tachycardia)   . Obstructive sleep apnea   . Hyperlipidemia   . Gastroesophageal reflux disease   . Anxiety and depression     Past Surgical History:  Procedure Laterality Date  . ANKLE SURGERY     Right  . LEFT HEART CATHETERIZATION WITH CORONARY ANGIOGRAM N/A 02/21/2011   Procedure: LEFT HEART CATHETERIZATION WITH CORONARY ANGIOGRAM;  Surgeon: Tonny Bollman, MD;  Location: [facility] CATH LAB;  Service: Cardiovascular;  Laterality: N/A;  . NASAL TURBINATE REDUCTION    . SHOULDER SURGERY     Right       Family History  Problem Relation Age of Onset  . Heart disease Father   . Heart attack Father   . Heart disease Paternal Grandfather     Social History   Tobacco Use  . Smoking status: Current Every Day Smoker    Packs/day: 1.00    Years: 23.00    Pack years: 23.00    Types: Cigarettes    Start date: 06/30/1987  . Smokeless tobacco: Never Used  Vaping Use  . Vaping Use: Never used  Substance Use Topics  . Alcohol use: No    Alcohol/week: 0.0 standard drinks  . Drug use: No    Home Medications Prior to Admission medications   Medication Sig Start Date End Date Taking? Authorizing Provider  esomeprazole (NEXIUM) 40 MG capsule Take 40 mg by mouth daily before breakfast.      [provider]  hydrocortisone (ANUSOL-HC) 2.5 % rectal cream Place 1 application rectally 2 (two) times daily. Patient not taking: Reported on 03/03/2018 08/24/17   Petrucelli, Pleas Koch, PA-C  ibuprofen (ADVIL,MOTRIN) 200 MG tablet Take 800 mg by mouth every 6 (six) hours as needed (for pain).     [provider]  lamoTRIgine (LAMICTAL) 100 MG tablet Take 100 mg by mouth daily.    [provider]  methocarbamol (ROBAXIN) 500 MG tablet Take 1 tablet (500 mg total) by mouth every 8 (eight) hours as needed. Patient not taking: Reported on 03/03/2018 08/24/17   Petrucelli, Lelon Mast R, PA-C  metoprolol succinate (TOPROL-XL) 50 MG 24 hr tablet Take  50 mg by mouth daily. Take with or immediately following a meal.     [provider]  predniSONE (DELTASONE) 50 MG tablet Take 1 tablet (50 mg total) by mouth daily with breakfast. Patient not taking: Reported on 03/03/2018 08/24/17   Petrucelli, Lelon Mast R, PA-C    Allergies    Statins and Simvastatin  Review of Systems   Review of Systems  Eyes: Positive for visual disturbance.  Neurological: Positive for headaches.    Physical Exam Updated Vital Signs BP (!) 153/89   Pulse (!) 58   Temp 97.6 F (36.4 C) (Oral)   Resp 19   Ht 1.829 m (6')   Wt 104.3 kg   SpO2 100%   BMI 31.19 kg/m   Physical Exam Vitals and nursing note reviewed.  Constitutional:      General: He is not in acute distress.    Appearance: Normal appearance. He is well-developed. He is not toxic-appearing.  HENT:     Head: Normocephalic and atraumatic.  Eyes:     Extraocular Movements: Extraocular movements intact.     Conjunctiva/sclera: Conjunctivae normal.     Pupils: Pupils are equal, round, and reactive to light.     Comments: No evidence of hyphema.  No pain with range of motion of eyes  Cardiovascular:     Rate and Rhythm: Normal rate and regular rhythm.     Heart sounds: No murmur heard.   Pulmonary:     Effort: Pulmonary effort is normal. No respiratory distress.     Breath sounds: Normal breath sounds.  Abdominal:     Palpations: Abdomen is soft.     Tenderness: There is no abdominal tenderness.  Musculoskeletal:        General: Normal range of motion.     Cervical back: Neck supple.  Skin:    General: Skin is warm and dry.     Capillary Refill: Capillary refill takes less than 2 seconds.  Neurological:     General: No focal deficit present.     Mental Status: He is alert and oriented to person, place, and time.     Cranial Nerves: Cranial nerve deficit present.     Sensory: No sensory deficit.     Motor: No weakness.     Comments: Subjective blurred vision right eye.   Vision grossly intact.     ED Results / Procedures / Treatments   Labs (all labs ordered are listed, but only abnormal results are displayed) Labs Reviewed  I-STAT CHEM 8, ED - Abnormal; Notable for the following components:      Result Value   Hemoglobin 17.3 (*)    All other components within normal limits  CBG MONITORING, ED - Abnormal; Notable for the following components:   Glucose-Capillary 108 (*)    All other components within normal  limits  RESP PANEL BY RT-PCR (FLU A&B, COVID) ARPGX2  ETHANOL  PROTIME-INR  APTT  CBC  DIFFERENTIAL  COMPREHENSIVE METABOLIC PANEL  RAPID URINE DRUG SCREEN, HOSP PERFORMED  URINALYSIS, ROUTINE W REFLEX MICROSCOPIC    EKG None  Radiology No results found.  Procedures Procedures   CRITICAL CARE Performed by: Vanetta Mulders Total critical care time: 45 minutes Critical care time was exclusive of separately billable procedures and treating other patients. Critical care was necessary to treat or prevent imminent or life-threatening deterioration. Critical care was time spent personally by me on the following activities: development of treatment plan with patient and/or surrogate as well as nursing, discussions with consultants, evaluation of patient's response to treatment, examination of patient, obtaining history from patient or surrogate, ordering and performing treatments and interventions, ordering and review of laboratory studies, ordering and review of radiographic studies, pulse oximetry and re-evaluation of patient's condition.   Medications Ordered in ED Medications - No data to display  ED Course  I have reviewed the triage vital signs and the nursing notes.  Pertinent labs & imaging results that were available during my care of the patient were reviewed by me and considered in my medical decision making (see chart for details).    MDM Rules/Calculators/A&P                          Patient symptoms somewhat concerning  maybe for cerebral hemorrhage.  Does not seem to have any significant neuro deficits.  And even now without the headache does not have significant deficits that would warrant TPA.  Patient has longstanding hypertension.  We will go ahead and activate stroke order set.  But not activate code stroke.   We will go get head CT.  Then CT angio head neck and CT venogram of head.  Patient's CT had CT angio and CT venogram without any acute findings.  New information from the patient's wife and that he had along with the blurred vision also had double or triple vision.  Patient now feeling better blood pressures are currently reasonable.  His blood pressure is followed by Dr. Janna Arch.  He is on medication for that.  Possible new onset migraine.  But do not know exactly how to explain the visual changes.  Patient with no eye pain at all.  And the pain is really more in his right temporal area.  Patient's had a cyst removed from that area in the past.  Sounds that was sebaceous cyst because pus drained from there that is nontender not infected.  Patient's ear canal and TM on the right side are normal.  Certainly not a TPA candidate but we will go ahead and have teleneurology consult on him to see whether patient can be discharged home or whether he needs to be admitted for MRI in the morning.  Final Clinical Impression(s) / ED Diagnoses Final diagnoses:  Primary hypertension  Acute intractable headache, unspecified headache type    Rx / DC Orders ED Discharge Orders    None       Vanetta Mulders, MD 04/16/20 1423    Vanetta Mulders, MD 04/16/20 1511

## 2020-04-16 NOTE — ED Provider Notes (Addendum)
Vision - was monocular - and has hx of migraines - though not diagnosed in the past - neuro has called me and suggested outpt f/u - optho for monocular diplopia - and treating migraines as outaptient.  Pt agreeable to plan.  BP is 126 / 80  Recommends against MRI and inpatient w/u.  I have updated the patient and his spouse, they are in total agreement, the patient is headache free, he has no diplopia at this time.  He was updated on all of his CT scans and laboratory work-up and understands the plan   Eber Hong, MD 04/16/20 1626    Eber Hong, MD 04/16/20 [phone]

## 2020-04-16 NOTE — ED Notes (Signed)
Pt brought back to ED room. Pt denies blurry vision at this time, but still reports the headache is the same.

## 2020-04-16 NOTE — Consult Note (Signed)
[facility]   Date of Service:   04/16/2020 16:06:15  Diagnosis:     .  H53.8 - Blurred Vision  Impression:     . 50 year old male presents with head pressure and monocular diplopia in the right eye. He did not have any binocular diplopia or any strokelike symptoms. The monocular diplopia in the right eye would suggest a potential ophthalmologic cause or migrainous phenomenon. He feels completely back to himself now. He does have undiagnosed migraine in the past although he is never had an aura before. Due to the fact that he is back to his baseline and low suspicion for ischemia as a cause of his monocular diplopia, I would recommend headache cocktails in the ER. If he feels completely back to normal, would suggest that he follow-up with an ophthalmologist to take a better look in the back of his eye on the right and make sure he does not have any refractive issue or any retinal issue that could have led to the monocular diplopia. I would also suggest that he follow-up with a neurologist as an outpatient for migraine management. Should he have any new neurological symptoms he should return to the ER.  Metrics: Last Known Well: 04/16/2020 13:30:00 [facility] Notification Time: 04/16/2020 16:05:43 Arrival Time: 04/16/2020 13:45:00 Stamp Time: 04/16/2020 16:06:15 Initial Response Time: 04/16/2020 16:10:36 Symptoms: HA, blurred vision. NIHSS Start Assessment Time: 04/16/2020 16:11:05 Patient is not a candidate for Thrombolytic. Thrombolytic Medical Decision: 04/16/2020 16:12:21 Patient was not deemed candidate for Thrombolytic because of following reasons: Resolved symptoms (no residual disabling symptoms). Other Diagnosis suspected.  CT head showed no acute hemorrhage or acute core infarct.  ED Physician notified of diagnostic impression and management plan on 04/16/2020 16:28:12  Advanced Imaging: CTA Head and Neck  Completed.  LVO:No   Our recommendations are outlined below.  Recommendations:     Marland Kitchen  Migraine cocktails as needed     .  Ophthalmology and neurology consultations as outpatient     .  Return precautions if any new neurological symptoms   Sign Out:     .  Discussed with Emergency Department Provider    ------------------------------------------------------------------------------  History of Present Illness: Patient is a 50 year old Male.  Patient was brought by private transportation with symptoms of HA, blurred vision.  50 year old male history of headaches but never diagnosed with migraine in the past, hypertension, presents with symptoms of head pressure, monocular diplopia and blurred vision in the right eye and lightheadedness. He reports that he began having symptoms at around 1:30 PM this afternoon. He began having blurred vision in the right eye as well as horizontal diplopia. However, he reports that the diplopia was monocular, did not improve with covering his left eye, and was not apparent in his left eye when he would cover his right eye. He also felt like his vision was a little bit hazy in the right eye as well. He felt a little lightheaded but no vertigo. No nausea or vomiting. Symptoms have essentially resolved at this point time and he feels completely back to baseline. Never had the symptoms before. Denies any intraocular issue such as glaucoma or retinal detachment in the past. Although he is never been diagnosed with migraine, he does get about 3 or 4 headaches per month that have migrainous qualities. CT angiogram head and neck as well as CT venogram of the brain with contrast showed no evidence of any abnormalities.   Past Medical History:     .  Hypertension     . Hyperlipidemia     . Coronary Artery Disease     . anxiety, depression, OSA  Anticoagulant use:  No  Antiplatelet use: No  Allergies:  Reviewed    Examination: BP(126/80), Pulse(64), Blood  Glucose(99) 1A: Level of Consciousness - Alert; keenly responsive + 0 1B: Ask Month and Age - Both Questions Right + 0 1C: Blink Eyes & Squeeze Hands - Performs Both Tasks + 0 2: Test Horizontal Extraocular Movements - Normal + 0 3: Test Visual Fields - No Visual Loss + 0 4: Test Facial Palsy (Use Grimace if Obtunded) - Normal symmetry + 0 5A: Test Left Arm Motor Drift - No Drift for 10 Seconds + 0 5B: Test Right Arm Motor Drift - No Drift for 10 Seconds + 0 6A: Test Left Leg Motor Drift - No Drift for 5 Seconds + 0 6B: Test Right Leg Motor Drift - No Drift for 5 Seconds + 0 7: Test Limb Ataxia (FNF/Heel-Shin) - No Ataxia + 0 8: Test Sensation - Normal; No sensory loss + 0 9: Test Language/Aphasia - Normal; No aphasia + 0 10: Test Dysarthria - Normal + 0 11: Test Extinction/Inattention - No abnormality + 0  NIHSS Score: 0  Pre-Morbid Modified Rankin Scale: 0 Points = No symptoms at all   Patient/Family was informed the Neurology Consult would occur via TeleHealth consult by way of interactive audio and video telecommunications and consented to receiving care in this manner.   Patient is being evaluated for possible acute neurologic impairment and high probability of imminent or life-threatening deterioration. I spent total of 35 minutes providing care to this patient, including time for face to face visit via telemedicine, review of medical records, imaging studies and discussion of findings with providers, the patient and/or family.   Dr Lacie Scotts   [facility] [phone]  Case [id number]

## 2020-04-16 NOTE — ED Notes (Signed)
Ulanda Edison, RN reported pt's creatinine level of 1.1 on I-stat chem 8. Lab value not crossing over to Epic at this time. Creatinine level reported to Micah, CT tech, in order to complete CT Angio.

## 2020-04-16 NOTE — ED Notes (Signed)
ED Provider at bedside. 

## 2020-08-21 ENCOUNTER — Other Ambulatory Visit: Payer: Self-pay

## 2020-08-21 ENCOUNTER — Emergency Department (HOSPITAL_COMMUNITY): Payer: Self-pay

## 2020-08-21 ENCOUNTER — Emergency Department (HOSPITAL_COMMUNITY)
Admission: EM | Admit: 2020-08-21 | Discharge: 2020-08-21 | Disposition: A | Discharge status: Home / Self Care | Payer: Self-pay | Attending: Emergency Medicine | Admitting: Emergency Medicine

## 2020-08-21 ENCOUNTER — Encounter (HOSPITAL_COMMUNITY): Payer: Self-pay | Admitting: *Deleted

## 2020-08-21 DIAGNOSIS — R1030 Lower abdominal pain, unspecified: Secondary | ICD-10-CM | POA: Insufficient documentation

## 2020-08-21 DIAGNOSIS — X58XXXA Exposure to other specified factors, initial encounter: Secondary | ICD-10-CM | POA: Insufficient documentation

## 2020-08-21 DIAGNOSIS — I10 Essential (primary) hypertension: Secondary | ICD-10-CM | POA: Insufficient documentation

## 2020-08-21 DIAGNOSIS — S79911A Unspecified injury of right hip, initial encounter: Secondary | ICD-10-CM | POA: Insufficient documentation

## 2020-08-21 DIAGNOSIS — I2510 Atherosclerotic heart disease of native coronary artery without angina pectoris: Secondary | ICD-10-CM | POA: Insufficient documentation

## 2020-08-21 DIAGNOSIS — F17210 Nicotine dependence, cigarettes, uncomplicated: Secondary | ICD-10-CM | POA: Insufficient documentation

## 2020-08-21 DIAGNOSIS — Z79899 Other long term (current) drug therapy: Secondary | ICD-10-CM | POA: Insufficient documentation

## 2020-08-21 LAB — CBC WITH DIFFERENTIAL/PLATELET
Abs Immature Granulocytes: 0.05 10*3/uL (ref 0.00–0.07)
Basophils Absolute: 0 10*3/uL (ref 0.0–0.1)
Basophils Relative: 0 %
Eosinophils Absolute: 0.2 10*3/uL (ref 0.0–0.5)
Eosinophils Relative: 1 %
HCT: 49.7 % (ref 39.0–52.0)
Hemoglobin: 17.5 g/dL — ABNORMAL HIGH (ref 13.0–17.0)
Immature Granulocytes: 0 %
Lymphocytes Relative: 21 %
Lymphs Abs: 2.6 10*3/uL (ref 0.7–4.0)
MCH: 32.1 pg (ref 26.0–34.0)
MCHC: 35.2 g/dL (ref 30.0–36.0)
MCV: 91 fL (ref 80.0–100.0)
Monocytes Absolute: 1.2 10*3/uL — ABNORMAL HIGH (ref 0.1–1.0)
Monocytes Relative: 10 %
Neutro Abs: 8.2 10*3/uL — ABNORMAL HIGH (ref 1.7–7.7)
Neutrophils Relative %: 68 %
Platelets: 270 10*3/uL (ref 150–400)
RBC: 5.46 MIL/uL (ref 4.22–5.81)
RDW: 14.2 % (ref 11.5–15.5)
WBC: 12.3 10*3/uL — ABNORMAL HIGH (ref 4.0–10.5)
nRBC: 0 % (ref 0.0–0.2)

## 2020-08-21 LAB — URINALYSIS, ROUTINE W REFLEX MICROSCOPIC
Bacteria, UA: NONE SEEN
Bilirubin Urine: NEGATIVE
Glucose, UA: NEGATIVE mg/dL
Ketones, ur: NEGATIVE mg/dL
Leukocytes,Ua: NEGATIVE
Nitrite: NEGATIVE
Protein, ur: NEGATIVE mg/dL
Specific Gravity, Urine: 1.018 (ref 1.005–1.030)
pH: 6 (ref 5.0–8.0)

## 2020-08-21 LAB — COMPREHENSIVE METABOLIC PANEL
ALT: 18 U/L (ref 0–44)
AST: 15 U/L (ref 15–41)
Albumin: 4.3 g/dL (ref 3.5–5.0)
Alkaline Phosphatase: 90 U/L (ref 38–126)
Anion gap: 8 (ref 5–15)
BUN: 11 mg/dL (ref 6–20)
CO2: 25 mmol/L (ref 22–32)
Calcium: 8.6 mg/dL — ABNORMAL LOW (ref 8.9–10.3)
Chloride: 102 mmol/L (ref 98–111)
Creatinine, Ser: 1.17 mg/dL (ref 0.61–1.24)
GFR, Estimated: >60 mL/min (ref >60)
Glucose, Bld: 96 mg/dL (ref 70–99)
Potassium: 3.8 mmol/L (ref 3.5–5.1)
Sodium: 135 mmol/L (ref 135–145)
Total Bilirubin: 0.8 mg/dL (ref 0.3–1.2)
Total Protein: 8 g/dL (ref 6.5–8.1)

## 2020-08-21 LAB — LIPASE, BLOOD: Lipase: 30 U/L (ref 11–51)

## 2020-08-21 MED ORDER — ONDANSETRON HCL 4 MG/2ML IJ SOLN
4.0000 mg | Freq: Once | INTRAMUSCULAR | Status: AC
  Administered 2020-08-21: 4 mg via INTRAVENOUS
  Filled 2020-08-21: qty 2

## 2020-08-21 MED ORDER — OXYCODONE-ACETAMINOPHEN 5-325 MG PO TABS: 1.0000 | ORAL_TABLET | ORAL | 0 refills | Status: DC | PRN

## 2020-08-21 MED ORDER — MORPHINE SULFATE (PF) 4 MG/ML IV SOLN
4.0000 mg | Freq: Once | INTRAVENOUS | Status: AC
  Administered 2020-08-21: 4 mg via INTRAVENOUS
  Filled 2020-08-21: qty 1

## 2020-08-21 MED ORDER — IOHEXOL 300 MG/ML  SOLN
100.0000 mL | Freq: Once | INTRAMUSCULAR | Status: AC | PRN
  Administered 2020-08-21: 100 mL via INTRAVENOUS

## 2020-08-21 MED ORDER — OXYCODONE-ACETAMINOPHEN 5-325 MG PO TABS: 1.0000 | ORAL_TABLET | Freq: Four times a day (QID) | ORAL | 0 refills | Status: DC | PRN

## 2020-08-21 NOTE — ED Notes (Signed)
Patient transported to CT 

## 2020-08-21 NOTE — ED Provider Notes (Signed)
[facility] EMERGENCY DEPARTMENT Provider Note   CSN: [id number] Arrival date & time: 08/21/20  1729     History Chief Complaint  Patient presents with   Hip Pain    SHREY BOIKE is a 50 y.o. male.   Hip Pain Associated symptoms include abdominal pain. Pertinent negatives include no chest pain and no shortness of breath.      LOYD MARHEFKA is a 50 y.o. male who presents to the Emergency Department complaining of right hip pain.  Pain is been present for 1 day.  Describes constant pain through his lower abdomen into his right groin area.  Pain is worse with movement.  He denies known injury but states that he does heavy lifting and lots of physical activity at his job.  Pain does not radiate into his leg.  He denies any numbness or weakness of his lower extremities.  No back or flank pain.  He denies any nausea, vomiting, chest pain or dysuria. No pain or swelling of his testicles.    Past Medical History:  Diagnosis Date   Anxiety and depression    2009-suicidal ideation, involuntary commitment   Congestive heart failure (HCC) ?   Coronary artery disease    Gastroesophageal reflux disease    History of PSVT (paroxysmal supraventricular tachycardia)    s/p ablation 2006; occasional palpitations; occasional atypical left chest pain; event recorder in 03/2011-normal sinus, sinus tachycardia, single symptomatic spell with sinus rhythm   Hyperlipidemia    Intolerant to statins causing tongue swelling   Hyperlipidemia    Lipid profile in 2008:225, 770, 27,?; statin allergy-oral edema   Hypertension    Lab 02/2011: Normal CBC and BMet, TSH-1.6   Obstructive sleep apnea    Wears CPAP intermittantly. Followed by Dr. Gildardo Cranker Pulmonology.    Patient Active Problem List   Diagnosis Date Noted   Hypertension    History of PSVT (paroxysmal supraventricular tachycardia)    Obstructive sleep apnea    Hyperlipidemia    Gastroesophageal reflux disease    Anxiety and depression      Past Surgical History:  Procedure Laterality Date   ANKLE SURGERY     Right   LEFT HEART CATHETERIZATION WITH CORONARY ANGIOGRAM N/A 02/21/2011   Procedure: LEFT HEART CATHETERIZATION WITH CORONARY ANGIOGRAM;  Surgeon: Tonny Bollman, MD;  Location: [facility] CATH LAB;  Service: Cardiovascular;  Laterality: N/A;   NASAL TURBINATE REDUCTION     SHOULDER SURGERY     Right       Family History  Problem Relation Age of Onset   Heart disease Father    Heart attack Father    Heart disease Paternal Grandfather     Social History   Tobacco Use   Smoking status: Every Day    Packs/day: 1.00    Years: 23.00    Pack years: 23.00    Types: Cigarettes    Start date: 06/30/1987   Smokeless tobacco: Never  Vaping Use   Vaping Use: Never used  Substance Use Topics   Alcohol use: No    Alcohol/week: 0.0 standard drinks   Drug use: No    Home Medications Prior to Admission medications   Medication Sig Start Date End Date Taking? Authorizing Provider  esomeprazole (NEXIUM) 40 MG capsule Take 40 mg by mouth daily before breakfast.      [provider]  hydrocortisone (ANUSOL-HC) 2.5 % rectal cream Place 1 application rectally 2 (two) times daily. Patient not taking: Reported on 03/03/2018 08/24/17  Petrucelli, Samantha R, PA-C  ibuprofen (ADVIL,MOTRIN) 200 MG tablet Take 800 mg by mouth every 6 (six) hours as needed (for pain).     [provider]  lamoTRIgine (LAMICTAL) 100 MG tablet Take 100 mg by mouth daily.    [provider]  methocarbamol (ROBAXIN) 500 MG tablet Take 1 tablet (500 mg total) by mouth every 8 (eight) hours as needed. Patient not taking: Reported on 03/03/2018 08/24/17   Petrucelli, Lelon Mast R, PA-C  metoprolol succinate (TOPROL-XL) 50 MG 24 hr tablet Take 50 mg by mouth daily. Take with or immediately following a meal.     [provider]  predniSONE (DELTASONE) 50 MG tablet Take 1 tablet (50 mg total) by mouth daily with  breakfast. Patient not taking: Reported on 03/03/2018 08/24/17   Petrucelli, Lelon Mast R, PA-C    Allergies    Statins and Simvastatin  Review of Systems   Review of Systems  Constitutional:  Negative for chills and fever.  Respiratory:  Negative for chest tightness and shortness of breath.   Cardiovascular:  Negative for chest pain.  Gastrointestinal:  Positive for abdominal pain. Negative for nausea and vomiting.  Genitourinary:  Negative for decreased urine volume, difficulty urinating, dysuria, penile pain, penile swelling, scrotal swelling and testicular pain.  Musculoskeletal:  Positive for arthralgias (right hip pain). Negative for back pain and joint swelling.  Skin:  Negative for color change and wound.  Neurological:  Negative for weakness and numbness.   Physical Exam Updated Vital Signs BP 135/85   Pulse (!) 104   Temp 98.8 F (37.1 C) (Oral)   Resp 20   SpO2 95%   Physical Exam Vitals and nursing note reviewed.  Constitutional:      Appearance: He is not ill-appearing or toxic-appearing.  HENT:     Mouth/Throat:     Mouth: Mucous membranes are moist.  Cardiovascular:     Rate and Rhythm: Normal rate and regular rhythm.     Pulses: Normal pulses.  Pulmonary:     Effort: Pulmonary effort is normal. No respiratory distress.  Abdominal:     Palpations: Abdomen is soft.     Tenderness: There is abdominal tenderness. There is no right CVA tenderness, left CVA tenderness or guarding.     Comments: Ttp of the RLQ, abd soft.  No guarding or rebound  Musculoskeletal:        General: Tenderness present. No swelling.     Cervical back: Normal range of motion.     Comments: Pain to the anterior right groin with SLR and external rotation of the right hip.  No bony deformity  Skin:    Capillary Refill: Capillary refill takes less than 2 seconds.     Findings: No rash.  Neurological:     Mental Status: He is alert.     Sensory: No sensory deficit.     Motor: No weakness.     ED Results / Procedures / Treatments   Labs (all labs ordered are listed, but only abnormal results are displayed) Labs Reviewed  COMPREHENSIVE METABOLIC PANEL - Abnormal; Notable for the following components:      Result Value   Calcium 8.6 (*)    All other components within normal limits  CBC WITH DIFFERENTIAL/PLATELET - Abnormal; Notable for the following components:   WBC 12.3 (*)    Hemoglobin 17.5 (*)    Neutro Abs 8.2 (*)    Monocytes Absolute 1.2 (*)    All other components within normal limits  URINALYSIS, ROUTINE W REFLEX MICROSCOPIC - Abnormal; Notable for the following components:   Hgb urine dipstick SMALL (*)    All other components within normal limits  LIPASE, BLOOD    EKG None  Radiology CT ABDOMEN PELVIS W CONTRAST  Result Date: 08/21/2020 CLINICAL DATA:  Nausea/vomiting RLQ abdominal pain, appendicitis suspected (Age >= 14y) EXAM: CT ABDOMEN AND PELVIS WITH CONTRAST TECHNIQUE: Multidetector CT imaging of the abdomen and pelvis was performed using the standard protocol following bolus administration of intravenous contrast. CONTRAST:  100mL OMNIPAQUE IOHEXOL 300 MG/ML  SOLN COMPARISON:  03/25/2009 FINDINGS: Lower chest: Included lung bases are clear.  Heart size is normal. Hepatobiliary: No focal liver abnormality is seen. No gallstones, gallbladder wall thickening, or biliary dilatation. Pancreas: Unremarkable. No pancreatic ductal dilatation or surrounding inflammatory changes. Spleen: Normal in size without focal abnormality. Adrenals/Urinary Tract: Unremarkable adrenal glands. 10 mm cyst within the interpolar region of the right kidney. Kidneys have otherwise symmetric enhancement. No renal stone or hydronephrosis. Ureters are unremarkable. Urinary bladder is decompressed, limiting its evaluation. Stomach/Bowel: Stomach is within normal limits. Appendix appears normal (series 2, image 59). No evidence of bowel wall thickening, distention, or inflammatory changes.  Vascular/Lymphatic: Scattered aortoiliac atherosclerotic calcifications without aneurysm. No abdominopelvic lymphadenopathy. Reproductive: Prostate is unremarkable. Other: No free fluid. No abdominopelvic fluid collection. No pneumoperitoneum. No abdominal wall hernia. Musculoskeletal: There are new irregular bony fragments along the anterior margin of the right anterior inferior iliac spine (series 2, image 73). Perifascial edema noted within the proximal anterior compartment musculature of the right thigh (series 2, image 85). Constellation of findings are suggestive of a osseous avulsion injury involving the rectus femoris tendon origin. Remaining visualized osseous structures demonstrate no evidence of acute fracture. IMPRESSION: 1. Irregular bony fragments along the anterior margin of the right anterior inferior iliac spine with associated perifascial edema within the proximal anterior compartment musculature of the right thigh. Findings are suggestive of a osseous avulsion injury involving the rectus femoris tendon origin. 2. No acute abdominopelvic findings. Normal appendix. Aortic Atherosclerosis (ICD10-I70.0). Electronically Signed   By: Duanne GuessNicholas  Plundo D.O.   On: 08/21/2020 21:53    Procedures Procedures   Medications Ordered in ED Medications  ondansetron (ZOFRAN) injection 4 mg (has no administration in time range)  morphine 4 MG/ML injection 4 mg (has no administration in time range)    ED Course  I have reviewed the triage vital signs and the nursing notes.  Pertinent labs & imaging results that were available during my care of the patient were reviewed by me and considered in my medical decision making (see chart for details).    MDM Rules/Calculators/A&P                          Patient here with sudden onset of right groin pain today.  Pain worsened with standing and walking.  Improves at rest.  No known injury, but does endorse heavy lifting and strenuous physical activity. On  exam, patient does have some tenderness of the right lower quadrant and groin area.  He also has reproducible pain with flexion and external rotation of the right hip.  Abdomen is soft.  No CVA tenderness.  Patient well-appearing.  Vital signs reassuring.  Sx's likely MSK, but acute appendicitis is also of concern.  Work-up will include CT of the abdomen/pelvis urinalysis and labs  Labs this evening without significant leukocytosis.  No electrolyte derangement.  Lipase unremarkable.  Urinalysis without evidence of  infection.  CT abdomen and pelvis show normal-appearing appendix with evidence suggestive of a bony avulsion injury involving the rectus femoris tendon  Discussed findings with Dr. Jacqulyn Bath.  Will provide crutches and pain medication.  Patient is agreeable to close outpatient follow-up with local orthopedics. Pain improved here after medication.  Pt has ambulated to restroom and gait was slow but steady.     Final Clinical Impression(s) / ED Diagnoses Final diagnoses:  Injury of right hip, initial encounter    Rx / DC Orders ED Discharge Orders     None        Pauline Aus, PA-C 08/22/20 1523    Long, Arlyss Repress, MD 08/23/20 1123

## 2020-08-21 NOTE — ED Triage Notes (Signed)
Right hip pain, worse with movement

## 2020-08-21 NOTE — Discharge Instructions (Signed)
The CT this evening shows that you have an injury of your hip.  I recommend that you apply ice packs on and off.  Use the crutches for weightbearing.  Call Dr. Mort Sawyers office to arrange a follow-up appointment.

## 2020-08-22 MED FILL — Oxycodone w/ Acetaminophen Tab 5-325 MG: ORAL | Qty: 6 | Status: AC

## 2020-10-06 ENCOUNTER — Encounter: Payer: Self-pay | Admitting: Internal Medicine

## 2020-10-06 ENCOUNTER — Ambulatory Visit (INDEPENDENT_AMBULATORY_CARE_PROVIDER_SITE_OTHER): Payer: 59 | Admitting: Internal Medicine

## 2020-10-06 ENCOUNTER — Other Ambulatory Visit: Payer: Self-pay

## 2020-10-06 VITALS — BP 129/81 | HR 61 | Temp 97.9°F | Resp 18 | Ht 72.0 in | Wt 235.1 lb

## 2020-10-06 DIAGNOSIS — Z2821 Immunization not carried out because of patient refusal: Secondary | ICD-10-CM

## 2020-10-06 DIAGNOSIS — Z7689 Persons encountering health services in other specified circumstances: Secondary | ICD-10-CM | POA: Diagnosis not present

## 2020-10-06 DIAGNOSIS — K219 Gastro-esophageal reflux disease without esophagitis: Secondary | ICD-10-CM

## 2020-10-06 DIAGNOSIS — I10 Essential (primary) hypertension: Secondary | ICD-10-CM | POA: Diagnosis not present

## 2020-10-06 DIAGNOSIS — M792 Neuralgia and neuritis, unspecified: Secondary | ICD-10-CM

## 2020-10-06 DIAGNOSIS — Z8679 Personal history of other diseases of the circulatory system: Secondary | ICD-10-CM

## 2020-10-06 DIAGNOSIS — L729 Follicular cyst of the skin and subcutaneous tissue, unspecified: Secondary | ICD-10-CM

## 2020-10-06 MED ORDER — METOPROLOL TARTRATE 25 MG PO TABS: 25.0000 mg | ORAL_TABLET | Freq: Two times a day (BID) | ORAL | 1 refills | Status: DC

## 2020-10-06 NOTE — Patient Instructions (Signed)
Please continue taking medications as prescribed.  Please try to cut down -> quit smoking.  Please follow low salt diet and perform moderate exercise/walking at least 150 mins/week.

## 2020-10-06 NOTE — Assessment & Plan Note (Signed)
BP Readings from Last 1 Encounters:  10/06/20 129/81   Well-controlled Counseled for compliance with the medications Advised DASH diet and moderate exercise/walking, at least 150 mins/week

## 2020-10-06 NOTE — Assessment & Plan Note (Signed)
On Nexium H/o gastric ulcer

## 2020-10-06 NOTE — Assessment & Plan Note (Signed)
S/p right peri-auricular cyst resection in 2018

## 2020-10-06 NOTE — Progress Notes (Signed)
New Patient Office Visit  Subjective:  Patient ID: [name], male    DOB: [birth year]  Age: 50 y.o. MRN: [id number]  CC:  Chief Complaint  Patient presents with   New Patient (Initial Visit)    New patient was seeing dr Janna Arch just establishing care     HPI [name] is a 50 y.o. male with PMH of PSVT s/p cardiac ablation, HTN, scalp cyst s/p excision, scalp neuralgia, GERD and HLD who presents for establishing care.  He is a former patient of Dr Janna Arch.  He has history of PSVT s/p cardiac ablation.  He denies any chest pain, dyspnea or palpitations currently.  He is on metoprolol for PSVT and HTN.  He has history of a scalp cyst, which was excised by her dermatologist.  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  Denies any dizziness, ear pain or ear discharge currently.  He has been taking Lamictal for scalp neuralgia since then.  He takes Nexium for GERD.  Denies any dysphagia or odynophagia.  He has had 3 doses of COVID vaccine.  He denied flu vaccine in the office today.  Past Medical History:  Diagnosis Date   Anxiety and depression    2009-suicidal ideation, involuntary commitment   Congestive heart failure (HCC) ?   Coronary artery disease    Gastroesophageal reflux disease    History of PSVT (paroxysmal supraventricular tachycardia)    s/p ablation 2006; occasional palpitations; occasional atypical left chest pain; event recorder in 03/2011-normal sinus, sinus tachycardia, single symptomatic spell with sinus rhythm   Hyperlipidemia    Intolerant to statins causing tongue swelling   Hyperlipidemia    Lipid profile in 2008:225, 770, 27,?; statin allergy-oral edema   Hypertension    Lab 02/2011: Normal CBC and BMet, TSH-1.6   Obstructive sleep apnea    Wears CPAP intermittantly. Followed by Dr. Gildardo Cranker Pulmonology.    Past Surgical History:  Procedure Laterality Date   ANKLE SURGERY     Right   LEFT HEART CATHETERIZATION  WITH CORONARY ANGIOGRAM N/A 02/21/2011   Procedure: LEFT HEART CATHETERIZATION WITH CORONARY ANGIOGRAM;  Surgeon: Tonny Bollman, MD;  Location: [facility] CATH LAB;  Service: Cardiovascular;  Laterality: N/A;   NASAL TURBINATE REDUCTION     SHOULDER SURGERY     Right    Family History  Problem Relation Age of Onset   Heart disease Father    Heart attack Father    Heart disease Paternal Grandfather     Social History   Socioeconomic History   Marital status: Single    Spouse name: Not on file   Number of children: Not on file   Years of education: Not on file   Highest education level: Not on file  Occupational History   Occupation: Construction-grading  Tobacco Use   Smoking status: Every Day    Packs/day: 1.00    Years: 23.00    Pack years: 23.00    Types: Cigarettes    Start date: 06/30/1987   Smokeless tobacco: Never  Vaping Use   Vaping Use: Never used  Substance and Sexual Activity   Alcohol use: No    Alcohol/week: 0.0 standard drinks   Drug use: No   Sexual activity: Not on file  Other Topics Concern   Not on file  Social History Narrative   Not on file   Social Determinants of Health   Financial Resource Strain: Not on file  Food Insecurity: Not  on file  Transportation Needs: Not on file  Physical Activity: Not on file  Stress: Not on file  Social Connections: Not on file  Intimate Partner Violence: Not on file    ROS Review of Systems  Constitutional:  Negative for chills and fever.  HENT:  Negative for congestion and sore throat.   Eyes:  Negative for pain and discharge.  Respiratory:  Negative for cough and shortness of breath.   Cardiovascular:  Negative for chest pain and palpitations.  Gastrointestinal:  Negative for constipation, diarrhea, nausea and vomiting.  Endocrine: Negative for polydipsia and polyuria.  Genitourinary:  Negative for dysuria and hematuria.  Musculoskeletal:  Negative for neck pain and neck stiffness.  Skin:  Negative for  rash.  Neurological:  Positive for headaches (Around right ear/scalp area). Negative for dizziness, weakness and numbness.  Psychiatric/Behavioral:  Negative for agitation and behavioral problems.    Objective:   Today's Vitals: BP 129/81 (BP Location: Left Arm, Patient Position: Sitting, Cuff Size: Normal)   Pulse 61   Temp 97.9 F (36.6 C) (Oral)   Resp 18   Ht 6' (1.829 m)   Wt 235 lb 1.9 oz (106.6 kg)   SpO2 95%   BMI 31.89 kg/m   Physical Exam Vitals reviewed.  Constitutional:      General: He is not in acute distress.    Appearance: He is not diaphoretic.  HENT:     Head: Normocephalic and atraumatic.     Nose: Nose normal.     Mouth/Throat:     Mouth: Mucous membranes are moist.  Eyes:     General: No scleral icterus.    Extraocular Movements: Extraocular movements intact.  Cardiovascular:     Rate and Rhythm: Normal rate and regular rhythm.     Pulses: Normal pulses.     Heart sounds: Normal heart sounds. No murmur heard. Pulmonary:     Breath sounds: Normal breath sounds. No wheezing or rales.  Musculoskeletal:     Cervical back: Neck supple. No tenderness.     Right lower leg: No edema.     Left lower leg: No edema.  Skin:    General: Skin is warm.     Findings: No rash.     Comments: Skin thickness over scalp area near right ear, s/p cyst excision  Neurological:     General: No focal deficit present.     Mental Status: He is alert and oriented to person, place, and time.  Psychiatric:        Mood and Affect: Mood normal.        Behavior: Behavior normal.    Assessment & Plan:   Problem List Items Addressed This Visit       Cardiovascular and Mediastinum   Hypertension    BP Readings from Last 1 Encounters:  10/06/20 129/81  Well-controlled Counseled for compliance with the medications Advised DASH diet and moderate exercise/walking, at least 150 mins/week       Relevant Medications   metoprolol tartrate (LOPRESSOR) 25 MG tablet      Digestive   Gastroesophageal reflux disease    On Nexium H/o gastric ulcer        Musculoskeletal and Integument   Scalp cyst    S/p right peri-auricular cyst resection in 2018        Other   History of PSVT (paroxysmal supraventricular tachycardia)    S/p cardiac ablation in 2006 On Metoprolol 25 mg BID      Relevant Medications  metoprolol tartrate (LOPRESSOR) 25 MG tablet   Neuralgia involving scalp    Right periauricular area since cyst resection On Lamictal, well-controlled currently      Encounter to establish care - Primary    Care established History and medications reviewed with the patient      Other Visit Diagnoses     Refused influenza vaccine           Outpatient Encounter Medications as of 10/06/2020  Medication Sig   esomeprazole (NEXIUM) 40 MG capsule Take 40 mg by mouth daily before breakfast.     ibuprofen (ADVIL,MOTRIN) 200 MG tablet Take 800 mg by mouth every 6 (six) hours as needed (for pain).    lamoTRIgine (LAMICTAL) 100 MG tablet Take 100 mg by mouth daily.   metoprolol tartrate (LOPRESSOR) 25 MG tablet Take 1 tablet (25 mg total) by mouth 2 (two) times daily.   [DISCONTINUED] metoprolol succinate (TOPROL-XL) 50 MG 24 hr tablet Take 50 mg by mouth daily. Take with or immediately following a meal.    [DISCONTINUED] hydrocortisone (ANUSOL-HC) 2.5 % rectal cream Place 1 application rectally 2 (two) times daily. (Patient not taking: No sig reported)   [DISCONTINUED] methocarbamol (ROBAXIN) 500 MG tablet Take 1 tablet (500 mg total) by mouth every 8 (eight) hours as needed. (Patient not taking: No sig reported)   [DISCONTINUED] oxyCODONE-acetaminophen (PERCOCET/ROXICET) 5-325 MG tablet Take 1 tablet by mouth every 6 (six) hours as needed for severe pain. (Patient not taking: Reported on 10/06/2020)   [DISCONTINUED] oxyCODONE-acetaminophen (PERCOCET/ROXICET) 5-325 MG tablet Take 1 tablet by mouth every 4 (four) hours as needed. (Patient not taking:  Reported on 10/06/2020)   [DISCONTINUED] predniSONE (DELTASONE) 50 MG tablet Take 1 tablet (50 mg total) by mouth daily with breakfast. (Patient not taking: No sig reported)   No facility-administered encounter medications on file as of 10/06/2020.    Follow-up: Return in about 6 months (around 04/05/2021) for Annual physical.   Anabel Halon, MD

## 2020-10-06 NOTE — Assessment & Plan Note (Signed)
S/p cardiac ablation in 2006 On Metoprolol 25 mg BID 

## 2020-10-06 NOTE — Assessment & Plan Note (Signed)
Care established History and medications reviewed with the patient 

## 2020-10-06 NOTE — Assessment & Plan Note (Signed)
Right periauricular area since cyst resection On Lamictal, well-controlled currently 

## 2020-12-21 ENCOUNTER — Ambulatory Visit
Admission: EM | Admit: 2020-12-21 | Discharge: 2020-12-21 | Disposition: A | Discharge status: Home / Self Care | Payer: 59 | Attending: Urgent Care | Admitting: Urgent Care

## 2020-12-21 ENCOUNTER — Other Ambulatory Visit: Payer: Self-pay

## 2020-12-21 ENCOUNTER — Encounter: Payer: Self-pay | Admitting: Emergency Medicine

## 2020-12-21 DIAGNOSIS — B9789 Other viral agents as the cause of diseases classified elsewhere: Secondary | ICD-10-CM

## 2020-12-21 DIAGNOSIS — F17200 Nicotine dependence, unspecified, uncomplicated: Secondary | ICD-10-CM

## 2020-12-21 DIAGNOSIS — R051 Acute cough: Secondary | ICD-10-CM

## 2020-12-21 DIAGNOSIS — J988 Other specified respiratory disorders: Secondary | ICD-10-CM

## 2020-12-21 DIAGNOSIS — R52 Pain, unspecified: Secondary | ICD-10-CM

## 2020-12-21 DIAGNOSIS — Z20822 Contact with and (suspected) exposure to covid-19: Secondary | ICD-10-CM

## 2020-12-21 MED ORDER — PREDNISONE 20 MG PO TABS: ORAL_TABLET | ORAL | 0 refills | Status: DC

## 2020-12-21 MED ORDER — BENZONATATE 100 MG PO CAPS: 100.0000 mg | ORAL_CAPSULE | Freq: Three times a day (TID) | ORAL | 0 refills | Status: DC | PRN

## 2020-12-21 MED ORDER — OSELTAMIVIR PHOSPHATE 75 MG PO CAPS: 75.0000 mg | ORAL_CAPSULE | Freq: Two times a day (BID) | ORAL | 0 refills | Status: DC

## 2020-12-21 MED ORDER — PROMETHAZINE-DM 6.25-15 MG/5ML PO SYRP: 5.0000 mL | ORAL_SOLUTION | Freq: Every evening | ORAL | 0 refills | Status: DC | PRN

## 2020-12-21 NOTE — ED Provider Notes (Signed)
[facility]   MRN: [id number] DOB: December 01, 1970  Subjective:   [name] is a 50 y.o. male presenting for moderate to severe body aches, sinus congestion, coughing that elicits chest pain and slight wheezing.  The cough is also hurting his upper back.  Has an extensive history of smoking, still does 1 pack/day.  Denies history of asthma, COPD.  He has significant risk factors of coronary artery disease, congestive heart failure.  No sick contacts to his knowledge.  No current facility-administered medications for this encounter.  Current Outpatient Medications:    esomeprazole (NEXIUM) 40 MG capsule, Take 40 mg by mouth daily before breakfast.  , Disp: , Rfl:    ibuprofen (ADVIL,MOTRIN) 200 MG tablet, Take 800 mg by mouth every 6 (six) hours as needed (for pain). , Disp: , Rfl:    lamoTRIgine (LAMICTAL) 100 MG tablet, Take 100 mg by mouth daily., Disp: , Rfl:    metoprolol tartrate (LOPRESSOR) 25 MG tablet, Take 1 tablet (25 mg total) by mouth 2 (two) times daily., Disp: 180 tablet, Rfl: 1   Allergies  Allergen Reactions   Statins Other (See Comments)    Causes flushing   Simvastatin Other (See Comments)    Caused flushing    Past Medical History:  Diagnosis Date   Anxiety and depression    2009-suicidal ideation, involuntary commitment   Congestive heart failure (HCC) ?   Coronary artery disease    Gastroesophageal reflux disease    History of PSVT (paroxysmal supraventricular tachycardia)    s/p ablation 2006; occasional palpitations; occasional atypical left chest pain; event recorder in 03/2011-normal sinus, sinus tachycardia, single symptomatic spell with sinus rhythm   Hyperlipidemia    Intolerant to statins causing tongue swelling   Hyperlipidemia    Lipid profile in 2008:225, 770, 27,?; statin allergy-oral edema   Hypertension    Lab 02/2011: Normal CBC and BMet, TSH-1.6   Obstructive sleep apnea    Wears CPAP intermittantly. Followed by Dr. Gildardo Cranker Pulmonology.     Past Surgical History:  Procedure Laterality Date   ANKLE SURGERY     Right   LEFT HEART CATHETERIZATION WITH CORONARY ANGIOGRAM N/A 02/21/2011   Procedure: LEFT HEART CATHETERIZATION WITH CORONARY ANGIOGRAM;  Surgeon: Tonny Bollman, MD;  Location: [facility] CATH LAB;  Service: Cardiovascular;  Laterality: N/A;   NASAL TURBINATE REDUCTION     SHOULDER SURGERY     Right    Family History  Problem Relation Age of Onset   Heart disease Father    Heart attack Father    Heart disease Paternal Grandfather     Social History   Tobacco Use   Smoking status: Every Day    Packs/day: 1.00    Years: 23.00    Pack years: 23.00    Types: Cigarettes    Start date: 06/30/1987   Smokeless tobacco: Never  Vaping Use   Vaping Use: Never used  Substance Use Topics   Alcohol use: No    Alcohol/week: 0.0 standard drinks   Drug use: No    ROS   Objective:   Vitals: BP (!) 148/91 (BP Location: Right Arm)   Pulse 73   Temp 98 F (36.7 C) (Oral)   Resp 18   SpO2 97%   Physical Exam Constitutional:      General: He is not in acute distress.    Appearance: Normal appearance. He is well-developed. He is not ill-appearing, toxic-appearing or diaphoretic.  HENT:     Head:  Normocephalic and atraumatic.     Right Ear: External ear normal.     Left Ear: External ear normal.     Nose: Congestion and rhinorrhea present.     Mouth/Throat:     Mouth: Mucous membranes are moist.  Eyes:     General: No scleral icterus.       Right eye: No discharge.        Left eye: No discharge.     Extraocular Movements: Extraocular movements intact.     Conjunctiva/sclera: Conjunctivae normal.     Pupils: Pupils are equal, round, and reactive to light.  Cardiovascular:     Rate and Rhythm: Normal rate and regular rhythm.     Heart sounds: Normal heart sounds. No murmur heard.   No friction rub. No gallop.  Pulmonary:     Effort: Pulmonary effort is normal. No respiratory  distress.     Breath sounds: Normal breath sounds. No stridor. No wheezing, rhonchi or rales.  Skin:    General: Skin is warm and dry.  Neurological:     Mental Status: He is alert and oriented to person, place, and time.  Psychiatric:        Mood and Affect: Mood normal.        Behavior: Behavior normal.        Thought Content: Thought content normal.        Judgment: Judgment normal.    Assessment and Plan :   PDMP not reviewed this encounter.  1. Viral respiratory illness   2. Exposure to COVID-19 virus   3. Body aches   4. Smoker   5. Acute cough    Patient requested coverage for influenza given his symptoms and I am in agreement given the incidence in the community and his high risk factors.  Would like to avoid different treatment especially not a smoker.  Offered him a prednisone course as well given his respiratory symptoms. Deferred imaging given clear cardiopulmonary exam, hemodynamically stable vital signs.  Testing is pending.  Counseled patient on potential for adverse effects with medications prescribed/recommended today, ER and return-to-clinic precautions discussed, patient verbalized understanding.    Wallis Bamberg, PA-C 12/21/20 [id number]

## 2020-12-21 NOTE — Discharge Instructions (Signed)
We will notify you of your test results as they arrive and may take between 48-72 hours.  I encourage you to sign up for MyChart if you have not already done so as this can be the easiest way for [place] to communicate results to you online or through a phone app.  Generally, we only contact you if it is a positive test result.  In the meantime, if you develop worsening symptoms including fever, chest pain, shortness of breath despite our current treatment plan then please report to the emergency room as this may be a sign of worsening status from possible viral infection.  Otherwise, we will manage this as a viral syndrome such as influenza. For sore throat or cough try using a honey-based tea. Use 3 teaspoons of honey with juice squeezed from half lemon. Place shaved pieces of ginger into 1/2-1 cup of water and warm over stove top. Then mix the ingredients and repeat every 4 hours as needed. Please take Tylenol 500mg -650mg  every 6 hours for aches and pains, fevers. Hydrate very well with at least 2 liters of water. Eat light meals such as soups to replenish electrolytes and soft fruits, veggies. Start an antihistamine like Zyrtec for postnasal drainage, sinus congestion.

## 2020-12-21 NOTE — ED Triage Notes (Signed)
Body aches, fever, cough, nasal congestion, upper back pain x 2 days.

## 2020-12-22 ENCOUNTER — Other Ambulatory Visit: Payer: Self-pay | Admitting: *Deleted

## 2020-12-22 MED ORDER — LAMOTRIGINE 100 MG PO TABS: 100.0000 mg | ORAL_TABLET | Freq: Every day | ORAL | 0 refills | Status: DC

## 2020-12-23 LAB — COVID-19, FLU A+B NAA
Influenza A, NAA: NOT DETECTED
Influenza B, NAA: NOT DETECTED
SARS-CoV-2, NAA: DETECTED — AB

## 2021-03-07 ENCOUNTER — Ambulatory Visit (INDEPENDENT_AMBULATORY_CARE_PROVIDER_SITE_OTHER): Payer: 59 | Admitting: Internal Medicine

## 2021-03-07 ENCOUNTER — Encounter: Payer: Self-pay | Admitting: Internal Medicine

## 2021-03-07 ENCOUNTER — Encounter (INDEPENDENT_AMBULATORY_CARE_PROVIDER_SITE_OTHER): Payer: Self-pay

## 2021-03-07 ENCOUNTER — Other Ambulatory Visit: Payer: Self-pay

## 2021-03-07 DIAGNOSIS — J0100 Acute maxillary sinusitis, unspecified: Secondary | ICD-10-CM

## 2021-03-07 DIAGNOSIS — M792 Neuralgia and neuritis, unspecified: Secondary | ICD-10-CM | POA: Diagnosis not present

## 2021-03-07 MED ORDER — AZITHROMYCIN 250 MG PO TABS: ORAL_TABLET | ORAL | 0 refills | Status: AC

## 2021-03-07 MED ORDER — LAMOTRIGINE 200 MG PO TABS: 200.0000 mg | ORAL_TABLET | Freq: Every day | ORAL | 1 refills | Status: DC

## 2021-03-07 MED ORDER — FLUTICASONE PROPIONATE 50 MCG/ACT NA SUSP: 2.0000 | Freq: Every day | NASAL | 6 refills | Status: DC

## 2021-03-07 NOTE — Progress Notes (Signed)
Virtual Visit via Telephone Note   This visit type was conducted due to national recommendations for restrictions regarding the COVID-19 Pandemic (e.g. social distancing) in an effort to limit this patient's exposure and mitigate transmission in our community.  Due to his co-morbid illnesses, this patient is at least at moderate risk for complications without adequate follow up.  This format is felt to be most appropriate for this patient at this time.  The patient did not have access to video technology/had technical difficulties with video requiring transitioning to audio format only (telephone).  All issues noted in this document were discussed and addressed.  No physical exam could be performed with this format.   Evaluation Performed:  Follow-up visit  Date:  03/07/2021   ID:  [name], DOB 1970/04/20, MRN [id number]  Patient Location: Home Provider Location: Office/Clinic  Participants: Patient Location of Patient: Home Location of Provider: Telehealth Consent was obtain for visit to be over via telehealth. I verified that I am speaking with the correct person using two identifiers.  PCP:  Anabel Halon, MD   Chief Complaint: Nasal congestion and cough  History of Present Illness:    [name] is a 51 y.o. male who has a televisit for complaint of nasal congestion, cough and postnasal drip for about the month now.  He started having sore throat and fatigue since yesterday.  Denies any fever, chills, dyspnea or wheezing currently.  He has tried using Claritin-D for sinus symptoms.  The patient does not have symptoms concerning for COVID-19 infection (fever, chills, cough, or new shortness of breath).   Past Medical, Surgical, Social History, Allergies, and Medications have been Reviewed.  Past Medical History:  Diagnosis Date   Anxiety and depression    2009-suicidal ideation, involuntary commitment   Congestive heart failure (HCC) ?   Coronary artery disease     Gastroesophageal reflux disease    History of PSVT (paroxysmal supraventricular tachycardia)    s/p ablation 2006; occasional palpitations; occasional atypical left chest pain; event recorder in 03/2011-normal sinus, sinus tachycardia, single symptomatic spell with sinus rhythm   Hyperlipidemia    Intolerant to statins causing tongue swelling   Hyperlipidemia    Lipid profile in 2008:225, 770, 27,?; statin allergy-oral edema   Hypertension    Lab 02/2011: Normal CBC and BMet, TSH-1.6   Obstructive sleep apnea    Wears CPAP intermittantly. Followed by Dr. Gildardo Cranker Pulmonology.   Past Surgical History:  Procedure Laterality Date   ANKLE SURGERY     Right   LEFT HEART CATHETERIZATION WITH CORONARY ANGIOGRAM N/A 02/21/2011   Procedure: LEFT HEART CATHETERIZATION WITH CORONARY ANGIOGRAM;  Surgeon: Tonny Bollman, MD;  Location: [facility] CATH LAB;  Service: Cardiovascular;  Laterality: N/A;   NASAL TURBINATE REDUCTION     SHOULDER SURGERY     Right     Current Meds  Medication Sig   azithromycin (ZITHROMAX) 250 MG tablet Take 2 tablets on day 1, then 1 tablet daily on days 2 through 5   esomeprazole (NEXIUM) 40 MG capsule Take 40 mg by mouth daily before breakfast.     fluticasone (FLONASE) 50 MCG/ACT nasal spray Place 2 sprays into both nostrils daily.   ibuprofen (ADVIL,MOTRIN) 200 MG tablet Take 800 mg by mouth every 6 (six) hours as needed (for pain).    metoprolol tartrate (LOPRESSOR) 25 MG tablet Take 1 tablet (25 mg total) by mouth 2 (two) times daily.   [DISCONTINUED] lamoTRIgine (LAMICTAL) 100  MG tablet Take 1 tablet (100 mg total) by mouth daily.     Allergies:   Statins and Simvastatin   ROS:   Please see the history of present illness.     All other systems reviewed and are negative.   Labs/Other Tests and Data Reviewed:    Recent Labs: 08/21/2020: ALT 18; BUN 11; Creatinine, Ser 1.17; Hemoglobin 17.5; Platelets 270; Potassium 3.8; Sodium 135   Recent Lipid  Panel Lab Results  Component Value Date/Time   CHOL 240 (H) 05/22/2010 08:20 AM   TRIG 364 (H) 05/22/2010 08:20 AM   HDL 34 (L) 05/22/2010 08:20 AM   CHOLHDL 7.1 05/22/2010 08:20 AM   LDLCALC 133 (H) 05/22/2010 08:20 AM    Wt Readings from Last 3 Encounters:  10/06/20 235 lb 1.9 oz (106.6 kg)  04/16/20 230 lb (104.3 kg)  08/24/17 225 lb (102.1 kg)     ASSESSMENT & PLAN:    Acute sinusitis Started empiric azithromycin as he has persistent symptoms despite symptomatic treatment Added Flonase for allergies Continue Claritin for allergies Advised to use Mucinex or Robitussin for cough/congestion  Neuralgia of scalp Was given Lamictal 100 mg dose Refilled Lamictal 200 mg QD as he was on 200 mg dose in the past  Time:   Today, I have spent 12 minutes reviewing the chart, including problem list, medications, and with the patient with telehealth technology discussing the above problems.   Medication Adjustments/Labs and Tests Ordered: Current medicines are reviewed at length with the patient today.  Concerns regarding medicines are outlined above.   Tests Ordered: No orders of the defined types were placed in this encounter.   Medication Changes: Meds ordered this encounter  Medications   azithromycin (ZITHROMAX) 250 MG tablet    Sig: Take 2 tablets on day 1, then 1 tablet daily on days 2 through 5    Dispense:  6 tablet    Refill:  0   fluticasone (FLONASE) 50 MCG/ACT nasal spray    Sig: Place 2 sprays into both nostrils daily.    Dispense:  16 g    Refill:  6   lamoTRIgine (LAMICTAL) 200 MG tablet    Sig: Take 1 tablet (200 mg total) by mouth daily.    Dispense:  90 tablet    Refill:  1    Dose change     Note: This dictation was prepared with Dragon dictation along with smaller phrase technology. Similar sounding words can be transcribed inadequately or may not be corrected upon review. Any transcriptional errors that result from this process are unintentional.       Disposition:  Follow up  Signed, Anabel Halon, MD  03/07/2021 2:50 PM     [name]

## 2021-03-20 ENCOUNTER — Telehealth: Payer: Self-pay

## 2021-03-20 NOTE — Telephone Encounter (Signed)
, °

## 2021-03-21 ENCOUNTER — Other Ambulatory Visit: Payer: Self-pay

## 2021-03-21 ENCOUNTER — Ambulatory Visit (INDEPENDENT_AMBULATORY_CARE_PROVIDER_SITE_OTHER): Payer: 59 | Admitting: Family Medicine

## 2021-03-21 ENCOUNTER — Encounter: Payer: Self-pay | Admitting: Family Medicine

## 2021-03-21 VITALS — BP 142/91 | HR 64 | Resp 16 | Ht 72.0 in | Wt 233.8 lb

## 2021-03-21 DIAGNOSIS — R6889 Other general symptoms and signs: Secondary | ICD-10-CM

## 2021-03-21 DIAGNOSIS — R053 Chronic cough: Secondary | ICD-10-CM

## 2021-03-21 DIAGNOSIS — R059 Cough, unspecified: Secondary | ICD-10-CM | POA: Insufficient documentation

## 2021-03-21 DIAGNOSIS — E785 Hyperlipidemia, unspecified: Secondary | ICD-10-CM

## 2021-03-21 DIAGNOSIS — I10 Essential (primary) hypertension: Secondary | ICD-10-CM

## 2021-03-21 DIAGNOSIS — F17210 Nicotine dependence, cigarettes, uncomplicated: Secondary | ICD-10-CM

## 2021-03-21 MED ORDER — ALBUTEROL SULFATE HFA 108 (90 BASE) MCG/ACT IN AERS: 2.0000 | INHALATION_SPRAY | Freq: Four times a day (QID) | RESPIRATORY_TRACT | 0 refills | Status: DC | PRN

## 2021-03-21 MED ORDER — BENZONATATE 100 MG PO CAPS: 100.0000 mg | ORAL_CAPSULE | Freq: Two times a day (BID) | ORAL | 0 refills | Status: DC | PRN

## 2021-03-21 MED ORDER — PREDNISONE 20 MG PO TABS: 20.0000 mg | ORAL_TABLET | Freq: Two times a day (BID) | ORAL | 0 refills | Status: DC

## 2021-03-21 NOTE — Patient Instructions (Addendum)
F/u with Dr Posey Pronto in 3 to 4 weeks, call if you need to be seen sooner ? ?Covid test today ? ?Work excuse for today and tomorrow ? ?CXR today, we will call you with result  today ? ?Labs today, CBC, lipid, cmp and eGFr ? ?Need to quit smoking for improved health, call [id number] for 24/7 help ? ?Prednisone, tessalon perles and albuterol inhaler prescribed, use as directed please ? ?Samples of delsyn dM , 7.5 cc twice daily for 3 days to be given to pateint ? ? Depo Medrol 40 mg IM in office today  ? ?Thanks for choosing [facility], we consider it a privelige to serve you. ? ?

## 2021-03-21 NOTE — Progress Notes (Signed)
? ?  GOERGE MOHR     MRN: [id number]      DOB: 1970/03/16 ? ? ?HPI ?Mr. Ferrin is here c/o shortness of breath , and chest tightness and cough, worse at night, was treated for sinusitis 2 weeks ago ?Denies sputum ? ?ROS ?Denies chest pains, palpitations and leg swelling ?Denies abdominal pain, nausea, vomiting,diarrhea or constipation.   ?Denies dysuria, frequency, hesitancy or incontinence. ?Denies joint pain, swelling and limitation in mobility. ?Denies headaches, seizures, numbness, or tingling. ?Denies depression, anxiety or insomnia. ?Denies skin break down or rash. ? ? ?PE ? ?BP (!) 142/91   Pulse 64   Resp 16   Ht 6' (1.829 m)   Wt 233 lb 12.8 oz (106.1 kg)   SpO2 95%   BMI 31.71 kg/m?  ? ?Patient alert and oriented and in no cardiopulmonary distress. ? ?HEENT: No facial asymmetry, EOMI,     Neck supple . ? ?Chest: Clear to auscultation bilaterally.decreased though adequate air entry ? ?CVS: S1, S2 no murmurs, no S3.Regular rate. ? ?ABD: Soft non tender.  ? ?Ext: No edema ? ?MS: Adequate ROM spine, shoulders, hips and knees. ? ?Skin: Intact, no ulcerations or rash noted. ? ?Psych: Good eye contact, normal affect. Memory intact not anxious or depressed appearing. ? ?CNS: CN 2-12 intact, power,  normal throughout.no focal deficits noted. ? ? ?Assessment & Plan ? ?Cough ?2 week history associated with wheezing and SOB, recent sinusitis . Short course of steroids, IV depomedrol in office, albuterol and tessalon perle ?CXR ?Needs to quit smoking ?Work excuse x 2 days ? ?Hypertension ?Not at goal, no med change, upcomoig re eval with PCP ?DASH diet and commitment to daily physical activity for a minimum of 30 minutes discussed and encouraged, as a part of hypertension management. ?The importance of attaining a healthy weight is also discussed. ? ?BP/Weight 03/21/2021 12/21/2020 10/06/2020 08/21/2020 04/16/2020 03/03/2018 08/24/2017  ?Systolic BP 142 148 129 118 122 102 132  ?Diastolic BP 91 91 81 77 74 74 94  ?Wt. (Lbs)  233.8 - 235.12 - 230 - 225  ?BMI 31.71 - 31.89 - 31.19 - 30.52  ? ? ? ? ? ?Cigarette smoker ?Asked:confirms currently smokes cigarettes ?Assess: Unwilling to set a quit date, but is cutting back ?Advise: needs to QUIT to reduce risk of cancer, cardio and cerebrovascular disease ?Assist: counseled for 5 minutes and literature provided ?Arrange: follow up in 2 to 4 months ? ? ?Flu-like symptoms ?covid testing  ? ? ?

## 2021-03-21 NOTE — Progress Notes (Signed)
SOB

## 2021-03-22 LAB — CMP14+EGFR
ALT: 20 IU/L (ref 0–44)
AST: 19 IU/L (ref 0–40)
Albumin/Globulin Ratio: 1.9 (ref 1.2–2.2)
Albumin: 4.9 g/dL (ref 4.0–5.0)
Alkaline Phosphatase: 114 IU/L (ref 44–121)
BUN/Creatinine Ratio: 15 (ref 9–20)
BUN: 16 mg/dL (ref 6–24)
Bilirubin Total: 0.5 mg/dL (ref 0.0–1.2)
CO2: 20 mmol/L (ref 20–29)
Calcium: 9.7 mg/dL (ref 8.7–10.2)
Chloride: 104 mmol/L (ref 96–106)
Creatinine, Ser: 1.08 mg/dL (ref 0.76–1.27)
Globulin, Total: 2.6 g/dL (ref 1.5–4.5)
Glucose: 87 mg/dL (ref 70–99)
Potassium: 4.6 mmol/L (ref 3.5–5.2)
Sodium: 139 mmol/L (ref 134–144)
Total Protein: 7.5 g/dL (ref 6.0–8.5)
eGFR: 84 mL/min/{1.73_m2} (ref >59)

## 2021-03-22 LAB — CBC
Hematocrit: 47.7 % (ref 37.5–51.0)
Hemoglobin: 16.5 g/dL (ref 13.0–17.7)
MCH: 30.9 pg (ref 26.6–33.0)
MCHC: 34.6 g/dL (ref 31.5–35.7)
MCV: 89 fL (ref 79–97)
Platelets: 294 10*3/uL (ref 150–450)
RBC: 5.34 x10E6/uL (ref 4.14–5.80)
RDW: 13.8 % (ref 11.6–15.4)
WBC: 8.1 10*3/uL (ref 3.4–10.8)

## 2021-03-22 LAB — LIPID PANEL
Chol/HDL Ratio: 8.8 ratio — ABNORMAL HIGH (ref 0.0–5.0)
Cholesterol, Total: 246 mg/dL — ABNORMAL HIGH (ref 100–199)
HDL: 28 mg/dL — ABNORMAL LOW (ref >39)
LDL Chol Calc (NIH): 124 mg/dL — ABNORMAL HIGH (ref 0–99)
Triglycerides: 522 mg/dL — ABNORMAL HIGH (ref 0–149)
VLDL Cholesterol Cal: 94 mg/dL — ABNORMAL HIGH (ref 5–40)

## 2021-03-23 LAB — NOVEL CORONAVIRUS, NAA

## 2021-03-30 ENCOUNTER — Encounter: Payer: Self-pay | Admitting: Family Medicine

## 2021-03-30 DIAGNOSIS — R6889 Other general symptoms and signs: Secondary | ICD-10-CM | POA: Insufficient documentation

## 2021-03-30 DIAGNOSIS — F17210 Nicotine dependence, cigarettes, uncomplicated: Secondary | ICD-10-CM | POA: Insufficient documentation

## 2021-03-30 NOTE — Assessment & Plan Note (Signed)
2 week history associated with wheezing and SOB, recent sinusitis . Short course of steroids, IV depomedrol in office, albuterol and tessalon perle ?CXR ?Needs to quit smoking ?Work excuse x 2 days ?

## 2021-03-30 NOTE — Assessment & Plan Note (Signed)
Asked:confirms currently smokes cigarettes °Assess: Unwilling to set a quit date, but is cutting back °Advise: needs to QUIT to reduce risk of cancer, cardio and cerebrovascular disease °Assist: counseled for 5 minutes and literature provided °Arrange: follow up in 2 to 4 months ° °

## 2021-03-30 NOTE — Assessment & Plan Note (Signed)
covid testing

## 2021-03-30 NOTE — Assessment & Plan Note (Signed)
Not at goal, no med change, upcomoig re eval with PCP ?DASH diet and commitment to daily physical activity for a minimum of 30 minutes discussed and encouraged, as a part of hypertension management. ?The importance of attaining a healthy weight is also discussed. ? ?BP/Weight 03/21/2021 12/21/2020 10/06/2020 08/21/2020 04/16/2020 03/03/2018 08/24/2017  ?Systolic BP [id number] 123456 122 102 132  ?Diastolic BP 91 91 81 77 74 74 94  ?Wt. (Lbs) 233.8 - 235.12 - 230 - 225  ?BMI 31.71 - 31.89 - 31.19 - 30.52  ? ? ? ? ?

## 2021-04-06 ENCOUNTER — Encounter: Payer: 59 | Admitting: Internal Medicine

## 2021-04-25 ENCOUNTER — Encounter: Payer: Self-pay | Admitting: Internal Medicine

## 2021-04-25 ENCOUNTER — Ambulatory Visit (INDEPENDENT_AMBULATORY_CARE_PROVIDER_SITE_OTHER): Payer: Self-pay | Admitting: Internal Medicine

## 2021-04-25 VITALS — BP 118/74 | HR 73 | Resp 18 | Ht 72.0 in | Wt 230.4 lb

## 2021-04-25 DIAGNOSIS — G4733 Obstructive sleep apnea (adult) (pediatric): Secondary | ICD-10-CM

## 2021-04-25 DIAGNOSIS — Z8679 Personal history of other diseases of the circulatory system: Secondary | ICD-10-CM

## 2021-04-25 DIAGNOSIS — J209 Acute bronchitis, unspecified: Secondary | ICD-10-CM

## 2021-04-25 DIAGNOSIS — Z1211 Encounter for screening for malignant neoplasm of colon: Secondary | ICD-10-CM

## 2021-04-25 DIAGNOSIS — Z114 Encounter for screening for human immunodeficiency virus [HIV]: Secondary | ICD-10-CM

## 2021-04-25 DIAGNOSIS — E559 Vitamin D deficiency, unspecified: Secondary | ICD-10-CM

## 2021-04-25 DIAGNOSIS — J309 Allergic rhinitis, unspecified: Secondary | ICD-10-CM

## 2021-04-25 DIAGNOSIS — Z0000 Encounter for general adult medical examination without abnormal findings: Secondary | ICD-10-CM

## 2021-04-25 DIAGNOSIS — E782 Mixed hyperlipidemia: Secondary | ICD-10-CM

## 2021-04-25 DIAGNOSIS — M792 Neuralgia and neuritis, unspecified: Secondary | ICD-10-CM

## 2021-04-25 DIAGNOSIS — Z1159 Encounter for screening for other viral diseases: Secondary | ICD-10-CM

## 2021-04-25 DIAGNOSIS — I10 Essential (primary) hypertension: Secondary | ICD-10-CM

## 2021-04-25 MED ORDER — ALBUTEROL SULFATE HFA 108 (90 BASE) MCG/ACT IN AERS: 2.0000 | INHALATION_SPRAY | Freq: Four times a day (QID) | RESPIRATORY_TRACT | 5 refills | Status: DC | PRN

## 2021-04-25 MED ORDER — EZETIMIBE 10 MG PO TABS: 10.0000 mg | ORAL_TABLET | Freq: Every day | ORAL | 3 refills | Status: DC

## 2021-04-25 MED ORDER — LEVOCETIRIZINE DIHYDROCHLORIDE 5 MG PO TABS: 5.0000 mg | ORAL_TABLET | Freq: Every evening | ORAL | 11 refills | Status: DC

## 2021-04-25 NOTE — Patient Instructions (Signed)
Please start taking Zetia for cholesterol. Please take Fish oil 2000 mg twice daily. ? ?Please continue to follow low cholesterol diet and ambulate as tolerated. ? ?Please take Xyzal for allergies. Use Flonase for allergies/nasal congestion. ? ?Please try to cut down -> quit smoking. ?

## 2021-04-26 ENCOUNTER — Encounter (INDEPENDENT_AMBULATORY_CARE_PROVIDER_SITE_OTHER): Payer: Self-pay | Admitting: *Deleted

## 2021-04-26 DIAGNOSIS — J329 Chronic sinusitis, unspecified: Secondary | ICD-10-CM | POA: Insufficient documentation

## 2021-04-26 DIAGNOSIS — J309 Allergic rhinitis, unspecified: Secondary | ICD-10-CM | POA: Insufficient documentation

## 2021-04-26 NOTE — Assessment & Plan Note (Signed)
BP Readings from Last 1 Encounters:  ?04/25/21 118/74  ? ?Well-controlled ?Counseled for compliance with the medications ?Advised DASH diet and moderate exercise/walking, at least 150 mins/week ?

## 2021-04-26 NOTE — Assessment & Plan Note (Signed)
Right periauricular area since cyst resection On Lamictal, well-controlled currently 

## 2021-04-26 NOTE — Assessment & Plan Note (Signed)
Lipid profile reviewed ?He does not tolerate statin, had angioedema like reaction ?Started Zetia for now ?Advised to take fish oil as well ?

## 2021-04-26 NOTE — Assessment & Plan Note (Addendum)
His dyspnea/apnea episodes could be due to OSA, advised to start using CPAP again ?If persistent symptoms, will refer to pulmonology ?

## 2021-04-26 NOTE — Progress Notes (Signed)
? ?Established Patient Office Visit ? ?Subjective:  ?Patient ID: [name], male    DOB: 1970-05-28  Age: 51 y.o. MRN: [id number] ? ?CC:  ?Chief Complaint  ?Patient presents with  ? Follow-up  ?  Follow up pt having trouble out of sinuses for about 2 months also wakes up feeling like he cant breathe at night   ? ? ?HPI ?[name] is a 51 y.o. male with past medical history of PSVT s/p cardiac ablation, HTN, scalp cyst s/p excision, scalp neuralgia, GERD and HLD who presents for f/u of his chronic medical conditions. ? ?He complains of nasal congestion, sinus pressure related headache and postnasal drip for about 2 months now.  He has been treated with antibiotic and steroids, but has recurrent sinus issues.  He denies any fever, chills, dyspnea or wheezing currently.  He has tried using Flonase with some relief. ? ?He also complains of dyspnea at nighttime and has had apneic episodes.  He used to use CPAP for OSA, but states that her last sleep study did not show OSA and was told to stop using CPAP.  He agrees to start using CPAP for now. ? ?He has history of PSVT s/p cardiac ablation.  He denies any chest pain, dyspnea or palpitations currently.  He is on metoprolol for PSVT and HTN. ?  ?He has history of a scalp cyst, which was excised by his dermatologist.  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  Denies any dizziness, ear pain or ear discharge currently.  He has been taking Lamictal for scalp neuralgia since then. ? ? ? ? ? ? ? ?Past Medical History:  ?Diagnosis Date  ? Anxiety and depression   ? 2009-suicidal ideation, involuntary commitment  ? Congestive heart failure ([place]) ?  ? Coronary artery disease   ? Gastroesophageal reflux disease   ? History of PSVT (paroxysmal supraventricular tachycardia)   ? s/p ablation 2006; occasional palpitations; occasional atypical left chest pain; event recorder in 03/2011-normal sinus, sinus tachycardia, single symptomatic spell with sinus  rhythm  ? Hyperlipidemia   ? Intolerant to statins causing tongue swelling  ? Hyperlipidemia   ? Lipid profile in 2008:225, 770, 27,?; statin allergy-oral edema  ? Hypertension   ? Lab 02/2011: Normal CBC and BMet, TSH-1.6  ? Obstructive sleep apnea   ? Wears CPAP intermittantly. Followed by Dr. Damita Dunnings Pulmonology.  ? ? ?Past Surgical History:  ?Procedure Laterality Date  ? ANKLE SURGERY    ? Right  ? LEFT HEART CATHETERIZATION WITH CORONARY ANGIOGRAM N/A 02/21/2011  ? Procedure: LEFT HEART CATHETERIZATION WITH CORONARY ANGIOGRAM;  Surgeon: Sherren Mocha, MD;  Location[facility] CATH LAB;  Service: Cardiovascular;  Laterality: N/A;  ? NASAL TURBINATE REDUCTION    ? SHOULDER SURGERY    ? Right  ? ? ?Family History  ?Problem Relation Age of Onset  ? Heart disease Father   ? Heart attack Father   ? Heart disease Paternal Grandfather   ? ? ?Social History  ? ?Socioeconomic History  ? Marital status: Single  ?  Spouse name: Not on file  ? Number of children: Not on file  ? Years of education: Not on file  ? Highest education level: Not on file  ?Occupational History  ? Occupation: Construction-grading  ?Tobacco Use  ? Smoking status: Every Day  ?  Packs/day: 1.00  ?  Years: 23.00  ?  Pack years: 23.00  ?  Types: Cigarettes  ?  Start date: 06/30/1987  ? Smokeless tobacco: Never  ?Vaping Use  ? Vaping Use: Never used  ?Substance and Sexual Activity  ? Alcohol use: No  ?  Alcohol/week: 0.0 standard drinks  ? Drug use: No  ? Sexual activity: Not on file  ?Other Topics Concern  ? Not on file  ?Social History Narrative  ? Not on file  ? ?Social Determinants of Health  ? ?Financial Resource Strain: Not on file  ?Food Insecurity: Not on file  ?Transportation Needs: Not on file  ?Physical Activity: Not on file  ?Stress: Not on file  ?Social Connections: Not on file  ?Intimate Partner Violence: Not on file  ? ? ?Outpatient Medications Prior to Visit  ?Medication Sig Dispense Refill  ? benzonatate (TESSALON) 100 MG capsule Take 1  capsule (100 mg total) by mouth 2 (two) times daily as needed for cough. 20 capsule 0  ? esomeprazole (NEXIUM) 40 MG capsule Take 40 mg by mouth daily before breakfast.      ? fluticasone (FLONASE) 50 MCG/ACT nasal spray Place 2 sprays into both nostrils daily. 16 g 6  ? ibuprofen (ADVIL,MOTRIN) 200 MG tablet Take 800 mg by mouth every 6 (six) hours as needed (for pain).     ? lamoTRIgine (LAMICTAL) 200 MG tablet Take 1 tablet (200 mg total) by mouth daily. 90 tablet 1  ? metoprolol tartrate (LOPRESSOR) 25 MG tablet Take 1 tablet (25 mg total) by mouth 2 (two) times daily. 180 tablet 1  ? albuterol (VENTOLIN HFA) 108 (90 Base) MCG/ACT inhaler Inhale 2 puffs into the lungs every 6 (six) hours as needed for wheezing or shortness of breath. 8 g 0  ? predniSONE (DELTASONE) 20 MG tablet Take 1 tablet (20 mg total) by mouth 2 (two) times daily with a meal. (Patient not taking: Reported on 04/25/2021) 10 tablet 0  ? ?No facility-administered medications prior to visit.  ? ? ?Allergies  ?Allergen Reactions  ? Statins Other (See Comments)  ?  Causes flushing  ? Simvastatin Other (See Comments)  ?  Caused flushing  ? ? ?ROS ?Review of Systems  ?Constitutional:  Negative for chills and fever.  ?HENT:  Positive for congestion, postnasal drip and sinus pressure. Negative for sore throat.   ?Eyes:  Negative for pain and discharge.  ?Respiratory:  Negative for cough and shortness of breath.   ?Cardiovascular:  Negative for chest pain and palpitations.  ?Gastrointestinal:  Negative for constipation, diarrhea, nausea and vomiting.  ?Endocrine: Negative for polydipsia and polyuria.  ?Genitourinary:  Negative for dysuria and hematuria.  ?Musculoskeletal:  Negative for neck pain and neck stiffness.  ?Skin:  Negative for rash.  ?Neurological:  Positive for headaches (Around right ear/scalp area). Negative for dizziness, weakness and numbness.  ?Psychiatric/Behavioral:  Negative for agitation and behavioral problems.   ? ?  ?Objective:  ?   ?Physical Exam ?Vitals reviewed.  ?Constitutional:   ?   General: He is not in acute distress. ?   Appearance: He is not diaphoretic.  ?HENT:  ?   Head: Normocephalic and atraumatic.  ?   Nose: Congestion present.  ?   Mouth/Throat:  ?   Mouth: Mucous membranes are moist.  ?Eyes:  ?   General: No scleral icterus. ?   Extraocular Movements: Extraocular movements intact.  ?Cardiovascular:  ?   Rate and Rhythm: Normal rate and regular rhythm.  ?   Pulses: Normal pulses.  ?   Heart sounds: Normal heart sounds. No murmur heard. ?Pulmonary:  ?  Breath sounds: Normal breath sounds. No wheezing or rales.  ?Musculoskeletal:  ?   Cervical back: Neck supple. No tenderness.  ?   Right lower leg: No edema.  ?   Left lower leg: No edema.  ?Skin: ?   General: Skin is warm.  ?   Findings: No rash.  ?   Comments: Skin thickness over scalp area near right ear, s/p cyst excision  ?Neurological:  ?   General: No focal deficit present.  ?   Mental Status: He is alert and oriented to person, place, and time.  ?Psychiatric:     ?   Mood and Affect: Mood normal.     ?   Behavior: Behavior normal.  ? ? ?BP 118/74 (BP Location: Right Arm, Patient Position: Sitting, Cuff Size: Normal)   Pulse 73   Resp 18   Ht 6' (1.829 m)   Wt 230 lb 6.4 oz (104.5 kg)   SpO2 96%   BMI 31.25 kg/m?  ?Wt Readings from Last 3 Encounters:  ?04/25/21 230 lb 6.4 oz (104.5 kg)  ?03/21/21 233 lb 12.8 oz (106.1 kg)  ?10/06/20 235 lb 1.9 oz (106.6 kg)  ? ? ?Lab Results  ?Component Value Date  ? TSH 1.63 03/13/2011  ? ?Lab Results  ?Component Value Date  ? WBC 8.1 03/21/2021  ? HGB 16.5 03/21/2021  ? HCT 47.7 03/21/2021  ? MCV 89 03/21/2021  ? PLT 294 03/21/2021  ? ?Lab Results  ?Component Value Date  ? NA 139 03/21/2021  ? K 4.6 03/21/2021  ? CO2 20 03/21/2021  ? GLUCOSE 87 03/21/2021  ? BUN 16 03/21/2021  ? CREATININE 1.08 03/21/2021  ? BILITOT 0.5 03/21/2021  ? ALKPHOS 114 03/21/2021  ? AST 19 03/21/2021  ? ALT 20 03/21/2021  ? PROT 7.5 03/21/2021  ? ALBUMIN  4.9 03/21/2021  ? CALCIUM 9.7 03/21/2021  ? ANIONGAP 8 08/21/2020  ? EGFR 84 03/21/2021  ? GFR 86.11 03/13/2011  ? ?Lab Results  ?Component Value Date  ? CHOL 246 (H) 03/21/2021  ? ?Lab Results  ?Component Valu

## 2021-04-26 NOTE — Assessment & Plan Note (Addendum)
His sinus symptoms are likely due to allergies ?Started Xyzal for allergies ?Continue Flonase for allergies ?Albuterol as needed for dyspnea/wheezing, could be related to bronchitis ?

## 2021-04-26 NOTE — Assessment & Plan Note (Signed)
S/p cardiac ablation in 2006 On Metoprolol 25 mg BID 

## 2021-04-28 ENCOUNTER — Other Ambulatory Visit: Payer: Self-pay

## 2021-04-28 ENCOUNTER — Emergency Department (HOSPITAL_COMMUNITY): Payer: Commercial Managed Care - PPO

## 2021-04-28 ENCOUNTER — Emergency Department (HOSPITAL_COMMUNITY)
Admission: EM | Admit: 2021-04-28 | Discharge: 2021-04-28 | Disposition: A | Discharge status: Home / Self Care | Payer: Commercial Managed Care - PPO | Attending: Emergency Medicine | Admitting: Emergency Medicine

## 2021-04-28 ENCOUNTER — Encounter (HOSPITAL_COMMUNITY): Payer: Self-pay

## 2021-04-28 DIAGNOSIS — R202 Paresthesia of skin: Secondary | ICD-10-CM | POA: Diagnosis not present

## 2021-04-28 DIAGNOSIS — I10 Essential (primary) hypertension: Secondary | ICD-10-CM | POA: Diagnosis not present

## 2021-04-28 DIAGNOSIS — F17210 Nicotine dependence, cigarettes, uncomplicated: Secondary | ICD-10-CM | POA: Insufficient documentation

## 2021-04-28 DIAGNOSIS — Z79899 Other long term (current) drug therapy: Secondary | ICD-10-CM | POA: Diagnosis not present

## 2021-04-28 DIAGNOSIS — R0789 Other chest pain: Secondary | ICD-10-CM | POA: Diagnosis present

## 2021-04-28 LAB — CBC WITH DIFFERENTIAL/PLATELET
Abs Immature Granulocytes: 0.01 10*3/uL (ref 0.00–0.07)
Basophils Absolute: 0 10*3/uL (ref 0.0–0.1)
Basophils Relative: 1 %
Eosinophils Absolute: 0.2 10*3/uL (ref 0.0–0.5)
Eosinophils Relative: 3 %
HCT: 47.5 % (ref 39.0–52.0)
Hemoglobin: 16.4 g/dL (ref 13.0–17.0)
Immature Granulocytes: 0 %
Lymphocytes Relative: 30 %
Lymphs Abs: 2.7 10*3/uL (ref 0.7–4.0)
MCH: 31.5 pg (ref 26.0–34.0)
MCHC: 34.5 g/dL (ref 30.0–36.0)
MCV: 91.3 fL (ref 80.0–100.0)
Monocytes Absolute: 0.8 10*3/uL (ref 0.1–1.0)
Monocytes Relative: 9 %
Neutro Abs: 5.1 10*3/uL (ref 1.7–7.7)
Neutrophils Relative %: 57 %
Platelets: 257 10*3/uL (ref 150–400)
RBC: 5.2 MIL/uL (ref 4.22–5.81)
RDW: 13.6 % (ref 11.5–15.5)
WBC: 8.8 10*3/uL (ref 4.0–10.5)
nRBC: 0 % (ref 0.0–0.2)

## 2021-04-28 LAB — BASIC METABOLIC PANEL
Anion gap: 8 (ref 5–15)
BUN: 10 mg/dL (ref 6–20)
CO2: 23 mmol/L (ref 22–32)
Calcium: 9 mg/dL (ref 8.9–10.3)
Chloride: 109 mmol/L (ref 98–111)
Creatinine, Ser: 1.05 mg/dL (ref 0.61–1.24)
GFR, Estimated: >60 mL/min (ref >60)
Glucose, Bld: 136 mg/dL — ABNORMAL HIGH (ref 70–99)
Potassium: 3.7 mmol/L (ref 3.5–5.1)
Sodium: 140 mmol/L (ref 135–145)

## 2021-04-28 LAB — TROPONIN I (HIGH SENSITIVITY)
Troponin I (High Sensitivity): 4 ng/L (ref <18)
Troponin I (High Sensitivity): 4 ng/L (ref <18)

## 2021-04-28 NOTE — Discharge Instructions (Addendum)
You were seen in the emergency department today for chest pain.  Your work-up here was reassuring.  Please continue to take your metoprolol as prescribed.  I am sending you a referral to cardiology so you can have follow-up.  If they do not call you by Tuesday please give them a call to set up a follow-up appointment.  In the meantime if you begin to have worsening chest pain with sweating, nausea or radiation of the pain to your arm, jaw please return to the emergency department. ?

## 2021-04-28 NOTE — ED Provider Notes (Signed)
?[facility] EMERGENCY DEPARTMENT ?Provider Note ? ? ?CSN: [id number] ?Arrival date & time: 04/28/21  1624 ? ?  ? ?History ? ?Chief Complaint  ?Patient presents with  ? Chest Pain  ? ? ?[name] is a 51 y.o. male. With past medical history of HTN, PSVT, OSA who presents to the emergency department with chest pain. ? ?States that at about 4:20PM was driving when he had an onset of left sided chest tightness. States that chest pressure and tightness progressed to having tightness and tingling in his left shoulder, left arm, left jaw. States he then became nauseated. No diaphoresis, shortness of breath. Does endorse he felt like his heart was racing. States that the intense episode lasted for around 2-3 minutes. He continues to endorse low level chest tightness.  ? ?Currently daily 1PPD smoker. No alcohol or drug use. Compliant on metoprolol. History of SVT s/p ablation in 2006. ? ? ?Chest Pain ?Associated symptoms: nausea, palpitations and shortness of breath   ?Associated symptoms: no abdominal pain, no diaphoresis, no dizziness, no fever, no headache and no vomiting   ? ?  ? ?Home Medications ?Prior to Admission medications   ?Medication Sig Start Date End Date Taking? Authorizing Provider  ?albuterol (VENTOLIN HFA) 108 (90 Base) MCG/ACT inhaler Inhale 2 puffs into the lungs every 6 (six) hours as needed for wheezing or shortness of breath. 04/25/21   Anabel Halon, MD  ?benzonatate (TESSALON) 100 MG capsule Take 1 capsule (100 mg total) by mouth 2 (two) times daily as needed for cough. 03/21/21   Kerri Perches, MD  ?esomeprazole (NEXIUM) 40 MG capsule Take 40 mg by mouth daily before breakfast.      [provider]  ?ezetimibe (ZETIA) 10 MG tablet Take 1 tablet (10 mg total) by mouth daily. 04/25/21   Anabel Halon, MD  ?fluticasone Aleda Grana) 50 MCG/ACT nasal spray Place 2 sprays into both nostrils daily. 03/07/21   Anabel Halon, MD  ?ibuprofen (ADVIL,MOTRIN) 200 MG tablet Take 800 mg by mouth  every 6 (six) hours as needed (for pain).     [provider]  ?lamoTRIgine (LAMICTAL) 200 MG tablet Take 1 tablet (200 mg total) by mouth daily. 03/07/21   Anabel Halon, MD  ?levocetirizine (XYZAL) 5 MG tablet Take 1 tablet (5 mg total) by mouth every evening. 04/25/21   Anabel Halon, MD  ?metoprolol tartrate (LOPRESSOR) 25 MG tablet Take 1 tablet (25 mg total) by mouth 2 (two) times daily. 10/06/20   Anabel Halon, MD  ?   ? ?Allergies    ?Statins and Simvastatin   ? ?Review of Systems   ?Review of Systems  ?Constitutional:  Negative for diaphoresis and fever.  ?Respiratory:  Positive for chest tightness and shortness of breath.   ?Cardiovascular:  Positive for chest pain and palpitations. Negative for leg swelling.  ?Gastrointestinal:  Positive for nausea. Negative for abdominal pain and vomiting.  ?Neurological:  Negative for dizziness, syncope and headaches.  ? ?Physical Exam ?Updated Vital Signs ?BP (!) 141/86   Pulse 71   Temp 98.5 ?F (36.9 ?C) (Oral)   Resp 16   Ht 6' (1.829 m)   Wt 102.1 kg   SpO2 99%   BMI 30.52 kg/m?  ?Physical Exam ?Vitals and nursing note reviewed.  ?Constitutional:   ?   General: He is not in acute distress. ?   Appearance: Normal appearance. He is well-developed. He is not ill-appearing or toxic-appearing.  ?HENT:  ?  Head: Normocephalic and atraumatic.  ?   Mouth/Throat:  ?   Mouth: Mucous membranes are moist.  ?   Pharynx: Oropharynx is clear.  ?Eyes:  ?   General: No scleral icterus. ?   Extraocular Movements: Extraocular movements intact.  ?   Pupils: Pupils are equal, round, and reactive to light.  ?Neck:  ?   Vascular: No JVD.  ?Cardiovascular:  ?   Rate and Rhythm: Normal rate and regular rhythm.  ?   Pulses:     ?     Radial pulses are 2+ on the right side and 2+ on the left side.  ?     Posterior tibial pulses are 1+ on the right side and 1+ on the left side.  ?   Heart sounds: Normal heart sounds. No murmur heard. ?Pulmonary:  ?   Effort: Pulmonary  effort is normal. No tachypnea or respiratory distress.  ?   Breath sounds: Normal breath sounds.  ?Chest:  ?   Chest wall: No tenderness.  ?Abdominal:  ?   General: Bowel sounds are normal. There is no distension.  ?   Palpations: Abdomen is soft.  ?   Tenderness: There is no abdominal tenderness.  ?Musculoskeletal:     ?   General: Normal range of motion.  ?   Cervical back: Normal range of motion and neck supple.  ?   Right lower leg: No edema.  ?   Left lower leg: No edema.  ?Skin: ?   General: Skin is warm and dry.  ?   Capillary Refill: Capillary refill takes less than 2 seconds.  ?Neurological:  ?   General: No focal deficit present.  ?   Mental Status: He is alert and oriented to person, place, and time. Mental status is at baseline.  ?Psychiatric:     ?   Mood and Affect: Mood normal.     ?   Behavior: Behavior normal.     ?   Thought Content: Thought content normal.     ?   Judgment: Judgment normal.  ? ? ?ED Results / Procedures / Treatments   ?Labs ?(all labs ordered are listed, but only abnormal results are displayed) ?Labs Reviewed  ?BASIC METABOLIC PANEL - Abnormal; Notable for the following components:  ?    Result Value  ? Glucose, Bld 136 (*)   ? All other components within normal limits  ?CBC WITH DIFFERENTIAL/PLATELET  ?TROPONIN I (HIGH SENSITIVITY)  ?TROPONIN I (HIGH SENSITIVITY)  ? ?EKG ?None ? ?Radiology ?DG Chest 2 View ? ?Result Date: 04/28/2021 ?CLINICAL DATA:  Chest pain for 45 minutes. History of smoking and coronary artery disease. EXAM: CHEST - 2 VIEW COMPARISON:  Radiographs 03/03/2018.  CT 10/23/2015. FINDINGS: The heart size and mediastinal contours are normal. The lungs are clear. There is no pleural effusion or pneumothorax. No acute osseous findings are identified. Multiple old rib fractures are present on the left. Telemetry leads overlie the chest. IMPRESSION: Stable chest.  No evidence of active cardiopulmonary process. Electronically Signed   By: Carey Bullocks M.D.   On:  04/28/2021 16:57   ? ?Procedures ?Procedures  ? ?Medications Ordered in ED ?Medications - No data to display ? ?ED Course/ Medical Decision Making/ A&P ?  ?                        ?Medical Decision Making ?Amount and/or Complexity of Data Reviewed ?Labs: ordered. ?Radiology: ordered. ? ?This patient  presents to the ED for concern of chest pain, this involves an extensive number of treatment options, and is a complaint that carries with it a high risk of complications and morbidity.  The differential diagnosis includes ACS, PE, PTX, PNA, stable angina, dissection, etc ? ?Co morbidities that complicate the patient evaluation ?HTN, PSVT ? ?Additional history obtained:  ?Additional history obtained from: none  ?External records from outside source obtained and reviewed including: previous ED visits ? ?EKG: ?EKG: normal EKG, normal sinus rhythm.  ? ?Cardiac Monitoring: ?The patient was maintained on a cardiac monitor.  I personally viewed and interpreted the cardiac monitored which showed an underlying rhythm of: sinus rhythm  ? ?Lab Results: ?I personally ordered, reviewed, and interpreted labs. ?Pertinent results include: ?BMP, CBC wnl ?Troponin 4, repeat 4, delta 0 ? ?Imaging Studies ordered:  ?I ordered imaging studies which included x-ray.  I independently reviewed & interpreted imaging & am in agreement with radiology impression. ?Imaging shows: ?CXR negative  ? ?ED Course: ?51 year old male who presents to the emergency department with chest pain. ? ?Exam without evidence of volume overload so doubt heart failure.  EKG without signs of active ischemia.  Troponin x2 4, delta 0.  Doubt NSTEMI.  Presentation is not consistent with acute PE.  PERC negative.  Chest x-ray without pneumothorax.  Symptoms are not consistent with dissection, pericarditis, tamponade, myocarditis.  No evidence of pneumonia on chest x-ray, no infectious symptoms. ? ?HEAR Score: 4   ? ?Although patient has more elevated heart score, work-up  here has been reassuring.  I am going to send an ambulatory referral to cardiology to ensure follow-up with the patient.  He has not been seen by cardiologist in years.  Last nuclear medicine scan was back in 2015 which wa

## 2021-04-28 NOTE — ED Triage Notes (Signed)
Patient c/o chest tightness just PTA. Tight and radiating into left arm. Patient also endorses dizziness ?

## 2021-05-10 ENCOUNTER — Encounter: Payer: Self-pay | Admitting: Cardiology

## 2021-05-10 ENCOUNTER — Ambulatory Visit (INDEPENDENT_AMBULATORY_CARE_PROVIDER_SITE_OTHER): Payer: Commercial Managed Care - PPO | Admitting: Cardiology

## 2021-05-10 VITALS — BP 120/78 | HR 67 | Ht 72.0 in | Wt 230.4 lb

## 2021-05-10 DIAGNOSIS — R079 Chest pain, unspecified: Secondary | ICD-10-CM

## 2021-05-10 MED ORDER — NITROGLYCERIN 0.4 MG SL SUBL: 0.4000 mg | SUBLINGUAL_TABLET | SUBLINGUAL | 3 refills | Status: DC | PRN

## 2021-05-10 MED ORDER — METOPROLOL TARTRATE 100 MG PO TABS: ORAL_TABLET | ORAL | 0 refills | Status: DC

## 2021-05-10 MED ORDER — ASPIRIN EC 81 MG PO TBEC: 81.0000 mg | DELAYED_RELEASE_TABLET | Freq: Every day | ORAL | 3 refills | Status: AC

## 2021-05-10 NOTE — Progress Notes (Signed)
?  ?Cardiology Office Note ? ? ?Date:  05/10/2021  ? ?ID:  [name], DOB 09/29/1970, MRN [id number] ? ?PCP:  Anabel HalonPatel, Rutwik K, MD  ?Cardiologist:   None ?Referring:  Anabel HalonPatel, Rutwik K, MD ? ?Chief Complaint  ?Patient presents with  ? Chest Pain  ? ? ?  ?History of Present Illness: ?[name] is a 51 y.o. male who presents for evaluation of chest pain.  He was referred by the ED.  I reviewed these records for this visit.     Enzymes were negative and EKG was not acute.      He said that the chest discomfort happened while he was driving down the road.  It was 8 out of 10 in intensity.  He was diaphoretic.  It lasted for 30 minutes.  He had some radiation to his jaw and down his left arm.  He was short of breath and nauseated.  This was different than pain he had at the time of his stress test.  It was different than reflux he had.  It was not positional.  It did not have anything to do with inspiration.  He has not had that discomfort since then.  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  He was physical today in fact and he did not get this discomfort. ? ?He has had SVT ablation in the past by Dr. Graciela HusbandsKlein.    He did have an echo in 2015.  This demonstrated normal ejection fraction.  There was mild left ventricular hypertrophy.  There was mild aortic leaflet thickening.  At that time he was seen by Dr. Wyline MoodBranch and had some chest pain.  He had a negative perfusion study.    ? ? ?Past Medical History:  ?Diagnosis Date  ? Anxiety and depression   ? 2009-suicidal ideation, involuntary commitment  ? Congestive heart failure (HCC) ?  ? Coronary artery disease   ? Gastroesophageal reflux disease   ? History of PSVT (paroxysmal supraventricular tachycardia)   ? s/p ablation 2006; occasional palpitations; occasional atypical left chest pain; event recorder in 03/2011-normal sinus, sinus tachycardia, single symptomatic spell with sinus rhythm  ? Hyperlipidemia   ? Intolerant to statins causing tongue  swelling  ? Hypertension   ? Lab 02/2011: Normal CBC and BMet, TSH-1.6  ? Obstructive sleep apnea   ? Wears CPAP intermittantly. [facility]  ? ? ?Past Surgical History:  ?Procedure Laterality Date  ? ANKLE SURGERY    ? Right  ? LEFT HEART CATHETERIZATION WITH CORONARY ANGIOGRAM N/A 02/21/2011  ? Procedure: LEFT HEART CATHETERIZATION WITH CORONARY ANGIOGRAM;  Surgeon: Tonny BollmanMichael Cooper, MD;  [facility];  Service: Cardiovascular;  Laterality: N/A;  ? NASAL TURBINATE REDUCTION    ? SHOULDER SURGERY    ? Right  ? ? ? ?Current Outpatient Medications  ?Medication Sig Dispense Refill  ? albuterol (VENTOLIN HFA) 108 (90 Base) MCG/ACT inhaler Inhale 2 puffs into the lungs every 6 (six) hours as needed for wheezing or shortness of breath. 8 g 5  ? aspirin EC 81 MG tablet Take 1 tablet (81 mg total) by mouth daily. Swallow whole. 90 tablet 3  ? esomeprazole (NEXIUM) 40 MG capsule Take 40 mg by mouth daily before breakfast.      ? ezetimibe (ZETIA) 10 MG tablet Take 1 tablet (10 mg total) by mouth daily. 90 tablet 3  ? fluticasone (FLONASE) 50 MCG/ACT nasal spray Place  2 sprays into both nostrils daily. 16 g 6  ? ibuprofen (ADVIL,MOTRIN) 200 MG tablet Take 800 mg by mouth every 6 (six) hours as needed (for pain).     ? lamoTRIgine (LAMICTAL) 200 MG tablet Take 1 tablet (200 mg total) by mouth daily. 90 tablet 1  ? metoprolol tartrate (LOPRESSOR) 100 MG tablet Take one tablet two hours prior to the test 1 tablet 0  ? metoprolol tartrate (LOPRESSOR) 25 MG tablet Take 1 tablet (25 mg total) by mouth 2 (two) times daily. 180 tablet 1  ? nitroGLYCERIN (NITROSTAT) 0.4 MG SL tablet Place 1 tablet (0.4 mg total) under the tongue every 5 (five) minutes as needed for chest pain. 25 tablet 3  ? ?No current facility-administered medications for this visit.  ? ? ?Allergies:   Statins and Simvastatin  ? ? ?Social History:  The patient  reports that he has been smoking cigarettes. He started smoking  about 33 years ago. He has a 23.00 pack-year smoking history. He has never used smokeless tobacco. He reports that he does not drink alcohol and does not use drugs.  ? ?Family History:  The patient's family history includes Heart attack (age of onset: 70) in his father; Heart disease in his father and paternal grandfather.  ? ? ?ROS:  Please see the history of present illness.   Otherwise, review of systems are positive for none.   All other systems are reviewed and negative.  ? ? ?PHYSICAL EXAM: ?VS:  BP 120/78   Pulse 67   Ht 6' (1.829 m)   Wt 230 lb 6.4 oz (104.5 kg)   SpO2 96%   BMI 31.25 kg/m?  , BMI Body mass index is 31.25 kg/m?. ?GENERAL:  Well appearing ?HEENT:  Pupils equal round and reactive, fundi not visualized, oral mucosa unremarkable ?NECK:  No jugular venous distention, waveform within normal limits, carotid upstroke brisk and symmetric, no bruits, no thyromegaly ?LYMPHATICS:  No cervical, inguinal adenopathy ?LUNGS:  Clear to auscultation bilaterally ?BACK:  No CVA tenderness ?CHEST:  Unremarkable ?HEART:  PMI not displaced or sustained,S1 and S2 within normal limits, no S3, no S4, no clicks, no rubs, no murmurs ?ABD:  Flat, positive bowel sounds normal in frequency in pitch, no bruits, no rebound, no guarding, no midline pulsatile mass, no hepatomegaly, no splenomegaly ?EXT:  2 plus pulses throughout, no edema, no cyanosis no clubbing ?SKIN:  No rashes no nodules ?NEURO:  Cranial nerves II through XII grossly intact, motor grossly intact throughout ?PSYCH:  Cognitively intact, oriented to person place and time ? ? ? ?EKG:  EKG is ordered today. ?The ekg ordered today demonstrates sinus rhythm, rate 67, axis within normal limits, intervals within normal limits, cannot exclude old inferior infarct. ? ? ?Recent Labs: ?03/21/2021: ALT 20 ?04/28/2021: BUN 10; Creatinine, Ser 1.05; Hemoglobin 16.4; Platelets 257; Potassium 3.7; Sodium 140  ? ? ?Lipid Panel ?   ?Component Value Date/Time  ? CHOL 246 (H)  03/21/2021 0844  ? TRIG 522 (H) 03/21/2021 0844  ? HDL 28 (L) 03/21/2021 0844  ? CHOLHDL 8.8 (H) 03/21/2021 0844  ? CHOLHDL 7.1 05/22/2010 0820  ? VLDL 73 (H) 05/22/2010 0820  ? LDLCALC 124 (H) 03/21/2021 0844  ? ?  ? ?Wt Readings from Last 3 Encounters:  ?05/10/21 230 lb 6.4 oz (104.5 kg)  ?04/28/21 225 lb (102.1 kg)  ?04/25/21 230 lb 6.4 oz (104.5 kg)  ?  ? ? ?Other studies Reviewed: ?Additional studies/ records that were reviewed today include:  ED records. ?Review of the above records demonstrates:  Please see elsewhere in the note.   ? ? ?ASSESSMENT AND PLAN: ? ?Chest pain: I think the chest discomfort has symptoms consistent with angina.  I think the pretest probability is at least moderate given his risk factors.  I am going to have him start taking an aspirin 81 mg daily.  He will get a prescription for sublingual nitroglycerin to take as needed.  I will set him up to have a coronary CT. ? ?Dyslipidemia: He is having this predominantly hypertriglyceridemia managed by his primary provider with diet and exercise with goals of therapy will be based on the study above. ? ?Tobacco: He understands need to stop smoking and we talked about this today. ? ? ?Current medicines are reviewed at length with the patient today.  The patient does not have concerns regarding medicines. ? ?The following changes have been made: As above ? ?Labs/ tests ordered today include:  ? ?Orders Placed This Encounter  ?Procedures  ? CT CORONARY MORPH W/CTA COR W/SCORE W/CA W/CM &/OR WO/CM  ? EKG 12-Lead  ? ? ? ?Disposition:   FU with me as needed based on the results of the above. ? ? ?Signed, ?Rollene Rotunda, MD  ?05/10/2021 3:05 PM    ?[facility] ? ? ? ?

## 2021-05-10 NOTE — Patient Instructions (Signed)
Medication Instructions:  ?START Aspirin 81 mg once daily ? ?NITROGLYCERIN: Take Nitroglycerin as needed for chest pain: Place one tablet under the tongue as needed every 5 minutes for chest pain. If you have to use three tablets with no relief, please call 911 or go to your closest Emergency Department. ? ?*If you need a refill on your cardiac medications before your next appointment, please call your pharmacy* ? ? ?Lab Work: ?None ordered ?If you have labs (blood work) drawn today and your tests are completely normal, you will receive your results only by: ?MyChart Message (if you have MyChart) OR ?A paper copy in the mail ?If you have any lab test that is abnormal or we need to change your treatment, we will call you to review the results. ? ?Follow-Up: ?At [facility], you and your health needs are our priority.  As part of our continuing mission to provide you with exceptional heart care, we have created designated Provider Care Teams.  These Care Teams include your primary Cardiologist (physician) and Advanced Practice Providers (APPs -  Physician Assistants and Nurse Practitioners) who all work together to provide you with the care you need, when you need it. ? ?We recommend signing up for the patient portal called "MyChart".  Sign up information is provided on this After Visit Summary.  MyChart is used to connect with patients for Virtual Visits (Telemedicine).  Patients are able to view lab/test results, encounter notes, upcoming appointments, etc.  Non-urgent messages can be sent to your provider as well.   ?To learn more about what you can do with MyChart, go to [email or web address].   ? ?Your next appointment:   ?Follow up with Dr. Antoine Poche pending results ? ?Other Instructions ? ? ?Your cardiac CT will be scheduled at one of the below locations:  ? ?[facility] ?[address] ?[place], [place] [address] ?[phone] ? ?OR ? ?[facility] ?[address] ?[address] ?[place], [place] [address] ?[phone] ? ?If scheduled at [facility], please arrive at the [facility] (Entrance C2) of [facility] 30 minutes prior to test start time. ?You can use the FREE valet parking offered at entrance C (encouraged to control the heart rate for the test)  ?Proceed to the [facility] Radiology Department (first floor) to check-in and test prep. ? ?All radiology patients and guests should use entrance C2 at [facility], accessed from [facility], even though the hospital's physical address listed is [address]. ? ? ? ?If scheduled at [facility], please arrive 15 mins early for check-in and test prep. ? ?Please follow these instructions carefully (unless otherwise directed): ? ?Hold all erectile dysfunction medications at least 3 days (72 hrs) prior to test. ? ?On the Night Before the Test: ?Be sure to Drink plenty of water. ?Do not consume any caffeinated/decaffeinated beverages or chocolate 12 hours prior to your test. ?Do not take any antihistamines 12 hours prior to your test. ? ? ?On the Day of the Test: ?Drink plenty of water until 1 hour prior to the test. ?Do not eat any food 4 hours prior to the test. ?You may take your regular medications prior to the test.  ?Take metoprolol (Lopressor) two hours prior to test. ?     ?After the Test: ?Drink plenty of water. ?After receiving IV contrast, you may experience a mild flushed feeling. This is normal. ?On occasion, you may experience  a mild rash up to 24 hours after the test. This is not dangerous. If this occurs, you can take Benadryl 25 mg and increase your fluid intake. ?If you experience trouble breathing, this can be serious. If it is severe call 911 IMMEDIATELY. If it is mild, please call our office. ?If you take any of these medications: Glipizide/Metformin, Avandament, Glucavance, please do not take 48 hours after  completing test unless otherwise instructed. ? ?We will call to schedule your test 2-4 weeks out understanding that some insurance companies will need an authorization prior to the service being performed.  ? ?For non-scheduling related questions, please contact the cardiac imaging nurse navigator should you have any questions/concerns: ?Rockwell Alexandria, Cardiac Imaging Nurse Navigator ?Larey Brick, Cardiac Imaging Nurse Navigator ?[facility] ?Direct Office Dial: [phone]  ? ?For scheduling needs, including cancellations and rescheduling, please call [place], [phone]. ? ? ? ? ?

## 2021-05-11 ENCOUNTER — Telehealth: Payer: Self-pay | Admitting: Licensed Clinical Social Worker

## 2021-05-11 NOTE — Telephone Encounter (Signed)
LCSW attempted to reach pt via telephone today at [phone] to discuss assistance applications, appears pt [name] may have termed in March 2023. No answer, voicemail left requesting call back. Will re-attempt as able.  ? ?Octavio Graves, MSW, LCSW ?Clinical Social Worker II ?[facility]  ?[phone]- work cell phone (preferred) ?[phone]- desk phone ? ?

## 2021-05-11 NOTE — Telephone Encounter (Signed)
Pt returned my call. We discussed self pay notes on account noting that pt coverage termed and was active 08/21/20-04/20/21. Pt does not get insurance through work, pays for it on his own. Encouraged him to reach out to [place]. Pt called [employer] and spoke with representative who shared his plan per their notes was still active. ?Plan ID # [id number] ?Member ID # [id number] ? ?I reached out to [place] with financial counseling who shared that pt should contact customer service w/ billing at [phone] to ensure they have active coverage on file.  ?Pt provided number via text as requested. I remain available, will not actively follow at this time.  ? ?[name], MSW, LCSW ?Clinical Social Worker II ?[facility]  ?[phone]- work cell phone (preferred) ?[phone]- desk phone ? ?

## 2021-05-14 ENCOUNTER — Telehealth (HOSPITAL_COMMUNITY): Payer: Self-pay | Admitting: Emergency Medicine

## 2021-05-14 NOTE — Telephone Encounter (Signed)
Attempted to call patient regarding upcoming cardiac CT appointment. °Left message on voicemail with name and callback number °Marilene Vath RN Navigator Cardiac Imaging °[facility] °[phone] Office °[phone] Cell ° °

## 2021-05-15 ENCOUNTER — Telehealth: Payer: Self-pay | Admitting: Cardiology

## 2021-05-15 ENCOUNTER — Other Ambulatory Visit: Payer: Self-pay | Admitting: Cardiology

## 2021-05-15 ENCOUNTER — Ambulatory Visit (HOSPITAL_COMMUNITY)
Admission: RE | Admit: 2021-05-15 | Discharge: 2021-05-15 | Disposition: A | Discharge status: Home / Self Care | Payer: Commercial Managed Care - PPO | Source: Ambulatory Visit | Attending: Cardiology | Admitting: Cardiology

## 2021-05-15 ENCOUNTER — Encounter (HOSPITAL_COMMUNITY): Payer: Self-pay

## 2021-05-15 DIAGNOSIS — R079 Chest pain, unspecified: Secondary | ICD-10-CM | POA: Diagnosis present

## 2021-05-15 DIAGNOSIS — R931 Abnormal findings on diagnostic imaging of heart and coronary circulation: Secondary | ICD-10-CM

## 2021-05-15 DIAGNOSIS — R072 Precordial pain: Secondary | ICD-10-CM

## 2021-05-15 DIAGNOSIS — I2510 Atherosclerotic heart disease of native coronary artery without angina pectoris: Secondary | ICD-10-CM | POA: Diagnosis not present

## 2021-05-15 MED ORDER — IOHEXOL 350 MG/ML SOLN
90.0000 mL | Freq: Once | INTRAVENOUS | Status: AC | PRN
  Administered 2021-05-15: 90 mL via INTRAVENOUS

## 2021-05-15 MED ORDER — IOHEXOL 350 MG/ML SOLN
100.0000 mL | Freq: Once | INTRAVENOUS | Status: AC | PRN
Start: 2021-05-15 — End: 2021-05-15
  Administered 2021-05-15: 100 mL via INTRAVENOUS

## 2021-05-15 MED ORDER — NITROGLYCERIN 0.4 MG SL SUBL
0.8000 mg | SUBLINGUAL_TABLET | Freq: Once | SUBLINGUAL | Status: AC
  Administered 2021-05-15: 0.8 mg via SUBLINGUAL

## 2021-05-15 MED ORDER — NITROGLYCERIN 0.4 MG SL SUBL
SUBLINGUAL_TABLET | SUBLINGUAL | Status: AC
  Filled 2021-05-15: qty 2

## 2021-05-15 NOTE — Telephone Encounter (Signed)
Patient calling for CT results. 

## 2021-05-15 NOTE — Progress Notes (Signed)
CT scan completed. Tolerated well. D/C home ambulatory with wife. Awake and alert. In no distress 

## 2021-05-15 NOTE — Telephone Encounter (Signed)
Spoke to patient stated he had coronary ct done this morning.He was calling for results.Advised results not available.I will make Dr.Hochrein aware. ?

## 2021-05-16 ENCOUNTER — Ambulatory Visit (HOSPITAL_COMMUNITY)
Admission: RE | Admit: 2021-05-16 | Discharge: 2021-05-16 | Disposition: A | Discharge status: Home / Self Care | Payer: Commercial Managed Care - PPO | Source: Ambulatory Visit | Attending: Cardiology | Admitting: Cardiology

## 2021-05-16 DIAGNOSIS — R931 Abnormal findings on diagnostic imaging of heart and coronary circulation: Secondary | ICD-10-CM

## 2021-05-17 ENCOUNTER — Telehealth: Payer: Self-pay

## 2021-05-17 NOTE — Telephone Encounter (Signed)
Pt updated with CT results along with MD's recommendations.Pt state his pcp recently started him on a cholesterol medication but unsure of name. Pt will call office back with medication name and dose once home.  ? ?Minus Breeding, MD  ?05/16/2021  7:18 AM EDT   ?  ?He had moderate blockage in the right coronary but this is not significant and should not be limiting flow to cause symptoms.  He needs aggressive risk reduction.   With his intolerance to statins and this amount of plaque I think we need to be aggressive with his lipids and I would like for him to see Dr. Debara Pickett.  Call [name] with the results and send results to [name], MD.  He can follow up with me as well for continued risk reduction.    ? ?

## 2021-05-26 ENCOUNTER — Other Ambulatory Visit: Payer: Self-pay | Admitting: Internal Medicine

## 2021-05-26 DIAGNOSIS — I10 Essential (primary) hypertension: Secondary | ICD-10-CM

## 2021-05-26 DIAGNOSIS — Z8679 Personal history of other diseases of the circulatory system: Secondary | ICD-10-CM

## 2021-05-26 NOTE — Progress Notes (Signed)
error 

## 2021-05-27 DIAGNOSIS — Z720 Tobacco use: Secondary | ICD-10-CM | POA: Insufficient documentation

## 2021-05-27 DIAGNOSIS — I2510 Atherosclerotic heart disease of native coronary artery without angina pectoris: Secondary | ICD-10-CM | POA: Insufficient documentation

## 2021-05-27 NOTE — Progress Notes (Signed)
?  ?Cardiology Office Note ? ? ?Date:  05/28/2021  ? ?ID:  [name], DOB 22-May-1970, MRN [id number] ? ?PCP:  Anabel Halon, MD  ?Cardiologist:   Rollene Rotunda, MD ?Referring:  Anabel Halon, MD ? ?Chief Complaint  ?Patient presents with  ? Coronary Artery Disease  ? ? ?  ?History of Present Illness: ?[name] is a 51 y.o. male who presents for evaluation of chest pain.  After my first visit I sent him for a coronary CT.  He had RCA mid 50 - 69% stenosis.  Calcium score was 88th at 73.  He has had SVT ablation in the past by Dr. Graciela Husbands.    He did have an echo in 2015.  This demonstrated normal ejection fraction.  There was mild left ventricular hypertrophy.  There was mild aortic leaflet thickening.  At that time he was seen by Dr. Wyline Mood and had some chest pain.  He had a negative perfusion study.    ? ?Since I saw him he has formed well.  He had pain and infarction.  He has a very physical job.   The patient denies any new symptoms such as chest discomfort, neck or arm discomfort. There has been no new shortness of breath, PND or orthopnea. There have been no reported palpitations, presyncope or syncope. ? ?Past Medical History:  ?Diagnosis Date  ? Anxiety and depression   ? 2009-suicidal ideation, involuntary commitment  ? Congestive heart failure (HCC) ?  ? Coronary artery disease   ? Gastroesophageal reflux disease   ? History of PSVT (paroxysmal supraventricular tachycardia)   ? s/p ablation 2006; occasional palpitations; occasional atypical left chest pain; event recorder in 03/2011-normal sinus, sinus tachycardia, single symptomatic spell with sinus rhythm  ? Hyperlipidemia   ? Intolerant to statins causing tongue swelling  ? Hypertension   ? Lab 02/2011: Normal CBC and BMet, TSH-1.6  ? Obstructive sleep apnea   ? Wears CPAP intermittantly. Followed by Dr. Gild[facility].  ? ? ?Past Surgical History:  ?Procedure Laterality Date  ? ANKLE SURGERY    ? Right  ? LEFT HEART CATHETERIZATION  WITH CORONARY ANGIOGRAM N/A 02/21/2011  ? Procedure: LEFT HEART CATHETERIZATION WITH CORONARY ANGIOGRAM;  Surgeon: Tonny Bollman, MD;  Location: [facility] CATH LAB;  Service: Cardiovascular;  Laterality: N/A;  ? NASAL TURBINATE REDUCTION    ? SHOULDER SURGERY    ? Right  ? ? ? ?Current Outpatient Medications  ?Medication Sig Dispense Refill  ? albuterol (VENTOLIN HFA) 108 (90 Base) MCG/ACT inhaler Inhale 2 puffs into the lungs every 6 (six) hours as needed for wheezing or shortness of breath. 8 g 5  ? aspirin EC 81 MG tablet Take 1 tablet (81 mg total) by mouth daily. Swallow whole. 90 tablet 3  ? atorvastatin (LIPITOR) 40 MG tablet Take 1 tablet (40 mg total) by mouth daily. 90 tablet 3  ? esomeprazole (NEXIUM) 40 MG capsule Take 40 mg by mouth daily before breakfast.      ? ezetimibe (ZETIA) 10 MG tablet Take 1 tablet (10 mg total) by mouth daily. 90 tablet 3  ? fluticasone (FLONASE) 50 MCG/ACT nasal spray Place 2 sprays into both nostrils daily. 16 g 6  ? ibuprofen (ADVIL,MOTRIN) 200 MG tablet Take 800 mg by mouth every 6 (six) hours as needed (for pain).     ? lamoTRIgine (LAMICTAL) 200 MG tablet Take 1 tablet (200 mg total) by mouth daily. 90 tablet 1  ? metoprolol tartrate (  LOPRESSOR) 100 MG tablet Take one tablet two hours prior to the test 1 tablet 0  ? metoprolol tartrate (LOPRESSOR) 25 MG tablet Take 1 tablet (25 mg total) by mouth 2 (two) times daily. 180 tablet 1  ? nitroGLYCERIN (NITROSTAT) 0.4 MG SL tablet Place 1 tablet (0.4 mg total) under the tongue every 5 (five) minutes as needed for chest pain. 25 tablet 3  ? ?No current facility-administered medications for this visit.  ? ? ?Allergies:   Statins and Simvastatin  ? ? ?ROS:  Please see the history of present illness.   Otherwise, review of systems are positive for none.   All other systems are reviewed and negative.  ? ? ?PHYSICAL EXAM: ?VS:  BP 120/88   Pulse 72   Ht 6' (1.829 m)   Wt 233 lb 3.2 oz (105.8 kg)   SpO2 96%   BMI 31.63 kg/m?  , BMI  Body mass index is 31.63 kg/m?. ?GENERAL:  Well appearing ?NECK:  No jugular venous distention, waveform within normal limits, carotid upstroke brisk and symmetric, no bruits, no thyromegaly ?LUNGS:  Clear to auscultation bilaterally ?CHEST:  Unremarkable ?HEART:  PMI not displaced or sustained,S1 and S2 within normal limits, no S3, no S4, no clicks, no rubs, no murmurs ?ABD:  Flat, positive bowel sounds normal in frequency in pitch, no bruits, no rebound, no guarding, no midline pulsatile mass, no hepatomegaly, no splenomegaly ?EXT:  2 plus pulses throughout, no edema, no cyanosis no clubbing ? ? ?EKG:  EKG is not ordered today. ? ? ?Recent Labs: ?03/21/2021: ALT 20 ?04/28/2021: BUN 10; Creatinine, Ser 1.05; Hemoglobin 16.4; Platelets 257; Potassium 3.7; Sodium 140  ? ? ?Lipid Panel ?   ?Component Value Date/Time  ? CHOL 246 (H) 03/21/2021 0844  ? TRIG 522 (H) 03/21/2021 0844  ? HDL 28 (L) 03/21/2021 0844  ? CHOLHDL 8.8 (H) 03/21/2021 0844  ? CHOLHDL 7.1 05/22/2010 0820  ? VLDL 73 (H) 05/22/2010 0820  ? LDLCALC 124 (H) 03/21/2021 0844  ? ?  ? ?Wt Readings from Last 3 Encounters:  ?05/28/21 233 lb 3.2 oz (105.8 kg)  ?05/10/21 230 lb 6.4 oz (104.5 kg)  ?04/28/21 225 lb (102.1 kg)  ?  ? ? ?Other studies Reviewed: ?Additional studies/ records that were reviewed today include: Labs ?Review of the above records demonstrates:  Please see elsewhere in the note.   ? ? ?ASSESSMENT AND PLAN: ? ?CAD: We had another long discussion primary prevention.  He has no anginal symptoms..  He had nonobstructive lesion.  At this point we are going to pursue aggressive risk reduction.  ? ?Dyslipidemia: LDL was 124.  He really cannot afford the Zetia and wants to stop it.  He said he had been responsive to statin past and really wants to try him on 20 Lipitor 40 mg daily.  We talked about more of a plant-based diet ? ?Tobacco:   He is now 2 cigarettes daily.  He has not been smoking completely.  ? ? ?Current medicines are reviewed at length  with the patient today.  The patient does not have concerns regarding medicines. ? ?The following changes have been made:  As above ? ?Labs/ tests ordered today include:  ? ?Orders Placed This Encounter  ?Procedures  ? Lipid panel  ? ? ? ?Disposition:   FU with me in six months  ? ?Signed, ?Rollene Rotunda, MD  ?05/28/2021 8:05 AM    ?[facility] ? ? ? ?

## 2021-05-28 ENCOUNTER — Ambulatory Visit (INDEPENDENT_AMBULATORY_CARE_PROVIDER_SITE_OTHER): Payer: Commercial Managed Care - PPO | Admitting: Cardiology

## 2021-05-28 ENCOUNTER — Encounter: Payer: Self-pay | Admitting: Cardiology

## 2021-05-28 VITALS — BP 120/88 | HR 72 | Ht 72.0 in | Wt 233.2 lb

## 2021-05-28 DIAGNOSIS — I2510 Atherosclerotic heart disease of native coronary artery without angina pectoris: Secondary | ICD-10-CM | POA: Diagnosis not present

## 2021-05-28 DIAGNOSIS — E785 Hyperlipidemia, unspecified: Secondary | ICD-10-CM | POA: Diagnosis not present

## 2021-05-28 DIAGNOSIS — Z720 Tobacco use: Secondary | ICD-10-CM

## 2021-05-28 MED ORDER — ATORVASTATIN CALCIUM 40 MG PO TABS: 40.0000 mg | ORAL_TABLET | Freq: Every day | ORAL | 3 refills | Status: DC

## 2021-05-28 NOTE — Patient Instructions (Signed)
Medication Instructions:  ?START Atorvastatin (Lipitor) 40 mg daily ? ?*If you need a refill on your cardiac medications before your next appointment, please call your pharmacy* ? ?Lab Work: ?Your physician recommends that you return for lab work in 82 weeks:  ?Fasting Lipid Panel-DO NOT EAT OR DRINK PAST MIDNIGHT. OKAY TO HAVE WATER ?If you have labs (blood work) drawn today and your tests are completely normal, you will receive your results only by: ?MyChart Message (if you have MyChart) OR ?A paper copy in the mail ?If you have any lab test that is abnormal or we need to change your treatment, we will call you to review the results. ? ?Testing/Procedures: ?NONE ordered at this time of appointment  ? ?Follow-Up: ?At [facility], you and your health needs are our priority.  As part of our continuing mission to provide you with exceptional heart care, we have created designated Provider Care Teams.  These Care Teams include your primary Cardiologist (physician) and Advanced Practice Providers (APPs -  Physician Assistants and Nurse Practitioners) who all work together to provide you with the care you need, when you need it. ? ?We recommend signing up for the patient portal called "MyChart".  Sign up information is provided on this After Visit Summary.  MyChart is used to connect with patients for Virtual Visits (Telemedicine).  Patients are able to view lab/test results, encounter notes, upcoming appointments, etc.  Non-urgent messages can be sent to your provider as well.   ?To learn more about what you can do with MyChart, go to [email or web address].   ? ?Your next appointment:   ?6 month(s) ? ?The format for your next appointment:   ?In Person ? ?Provider:   ?Minus Breeding, MD   ? ?Other Instructions ? ? ?Important Information About Sugar ? ? ? ? ? ? ?

## 2021-05-31 ENCOUNTER — Ambulatory Visit: Payer: Commercial Managed Care - PPO | Admitting: Cardiology

## 2021-06-01 ENCOUNTER — Encounter: Payer: Self-pay | Admitting: Cardiology

## 2021-08-17 ENCOUNTER — Other Ambulatory Visit: Payer: Self-pay | Admitting: Internal Medicine

## 2021-08-17 DIAGNOSIS — M792 Neuralgia and neuritis, unspecified: Secondary | ICD-10-CM

## 2021-08-20 ENCOUNTER — Other Ambulatory Visit: Payer: Self-pay | Admitting: Internal Medicine

## 2021-08-20 DIAGNOSIS — M792 Neuralgia and neuritis, unspecified: Secondary | ICD-10-CM

## 2021-08-29 ENCOUNTER — Other Ambulatory Visit: Payer: Self-pay | Admitting: Internal Medicine

## 2021-08-29 DIAGNOSIS — Z8679 Personal history of other diseases of the circulatory system: Secondary | ICD-10-CM

## 2021-08-29 DIAGNOSIS — I10 Essential (primary) hypertension: Secondary | ICD-10-CM

## 2021-10-03 ENCOUNTER — Encounter: Payer: Self-pay | Admitting: *Deleted

## 2021-10-09 ENCOUNTER — Encounter: Payer: Self-pay | Admitting: Internal Medicine

## 2021-10-09 ENCOUNTER — Ambulatory Visit (INDEPENDENT_AMBULATORY_CARE_PROVIDER_SITE_OTHER): Payer: Commercial Managed Care - PPO | Admitting: Internal Medicine

## 2021-10-09 VITALS — BP 132/84 | HR 65 | Resp 18 | Ht 72.0 in | Wt 230.0 lb

## 2021-10-09 DIAGNOSIS — L858 Other specified epidermal thickening: Secondary | ICD-10-CM

## 2021-10-09 DIAGNOSIS — Z2821 Immunization not carried out because of patient refusal: Secondary | ICD-10-CM

## 2021-10-09 DIAGNOSIS — E782 Mixed hyperlipidemia: Secondary | ICD-10-CM

## 2021-10-09 DIAGNOSIS — I10 Essential (primary) hypertension: Secondary | ICD-10-CM

## 2021-10-09 DIAGNOSIS — Z8679 Personal history of other diseases of the circulatory system: Secondary | ICD-10-CM

## 2021-10-09 DIAGNOSIS — I25118 Atherosclerotic heart disease of native coronary artery with other forms of angina pectoris: Secondary | ICD-10-CM

## 2021-10-09 DIAGNOSIS — Z720 Tobacco use: Secondary | ICD-10-CM

## 2021-10-09 MED ORDER — METOPROLOL TARTRATE 25 MG PO TABS: 25.0000 mg | ORAL_TABLET | Freq: Two times a day (BID) | ORAL | 3 refills | Status: DC

## 2021-10-09 NOTE — Assessment & Plan Note (Signed)
BP Readings from Last 1 Encounters:  10/09/21 132/84   Well-controlled Counseled for compliance with the medications Advised DASH diet and moderate exercise/walking, at least 150 mins/week

## 2021-10-09 NOTE — Assessment & Plan Note (Signed)
S/p cardiac ablation in 2006 On Metoprolol 25 mg BID 

## 2021-10-09 NOTE — Assessment & Plan Note (Addendum)
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 

## 2021-10-09 NOTE — Assessment & Plan Note (Signed)
Smokes about 0.25 pack/day, has 35-pack year smoking history  Asked about quitting: confirms that he/she currently smokes cigarettes Advise to quit smoking: Educated about QUITTING to reduce the risk of cancer, cardio and cerebrovascular disease. Assess willingness: Unwilling to quit at this time, but is working on cutting back. Assist with counseling and pharmacotherapy: Counseled for 5 minutes and literature provided. Arrange for follow up: follow up in 3 months and continue to offer help.

## 2021-10-09 NOTE — Assessment & Plan Note (Signed)
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

## 2021-10-09 NOTE — Patient Instructions (Signed)
Please continue taking medications as prescribed.  Please continue to follow low salt diet and ambulate as tolerated. 

## 2021-10-09 NOTE — Progress Notes (Signed)
Acute Office Visit  Subjective:    Patient ID: [name], male    DOB: 05-08-1970, 51 y.o.   MRN: [id number]  Chief Complaint  Patient presents with   Follow-up    Patient wanted to make sure you knew he had three blockages in his heart also has spot on  his nose that has been there for a year but getting bigger does not want to see [facility]     HPI Patient is in today for evaluation of nose lesion, present for a year and has been growing recently.  He reports slight discomfort upon touching there.  Denies any bleeding or discharge from the site.  He used to see [facility], but have closed the office now.  He has history of nonobstructive CAD and HLD.  He reported allergy to statin in the last visit, and was started on Zetia instead.  He was not able to afford it.  His cardiologist has started Lipitor 40 mg daily again, which he reports that he is tolerating well.  He reports episodes of chest pain, mostly MSK in etiology, according to him.  Denies any dyspnea or palpitations currently.  He has nitroglycerin as needed for chest pain, but has not used it yet.  Past Medical History:  Diagnosis Date   Anxiety and depression    2009-suicidal ideation, involuntary commitment   Congestive heart failure ([place]) ?   Coronary artery disease    Gastroesophageal reflux disease    History of PSVT (paroxysmal supraventricular tachycardia)    s/p ablation 2006; occasional palpitations; occasional atypical left chest pain; event recorder in 03/2011-normal sinus, sinus tachycardia, single symptomatic spell with sinus rhythm   Hyperlipidemia    Intolerant to statins causing tongue swelling   Hypertension    Lab 02/2011: Normal CBC and BMet, TSH-1.6   Obstructive sleep apnea    Wears CPAP intermittantly. Followed by Dr. Damita Dunnings Pulmonology.    Past Surgical History:  Procedure Laterality Date   ANKLE SURGERY     Right   LEFT HEART CATHETERIZATION WITH CORONARY ANGIOGRAM  N/A 02/21/2011   Procedure: LEFT HEART CATHETERIZATION WITH CORONARY ANGIOGRAM;  Surgeon: Sherren Mocha, MD;  Location: [facility] CATH LAB;  Service: Cardiovascular;  Laterality: N/A;   NASAL TURBINATE REDUCTION     SHOULDER SURGERY     Right    Family History  Problem Relation Age of Onset   Heart disease Father    Heart attack Father 26   Heart disease Paternal Grandfather     Social History   Socioeconomic History   Marital status: Single    Spouse name: Not on file   Number of children: Not on file   Years of education: Not on file   Highest education level: Not on file  Occupational History   Occupation: Construction-grading  Tobacco Use   Smoking status: Every Day    Packs/day: 1.00    Years: 23.00    Total pack years: 23.00    Types: Cigarettes    Start date: 06/30/1987   Smokeless tobacco: Never  Vaping Use   Vaping Use: Never used  Substance and Sexual Activity   Alcohol use: No    Alcohol/week: 0.0 standard drinks of alcohol   Drug use: No   Sexual activity: Not on file  Other Topics Concern   Not on file  Social History Narrative   Lives with ex girlfriend.  Grown daughters.  7 grands.     Social Determinants of  Health   Financial Resource Strain: Not on file  Food Insecurity: Not on file  Transportation Needs: Not on file  Physical Activity: Not on file  Stress: Not on file  Social Connections: Not on file  Intimate Partner Violence: Not on file    Outpatient Medications Prior to Visit  Medication Sig Dispense Refill   albuterol (VENTOLIN HFA) 108 (90 Base) MCG/ACT inhaler Inhale 2 puffs into the lungs every 6 (six) hours as needed for wheezing or shortness of breath. 8 g 5   aspirin EC 81 MG tablet Take 1 tablet (81 mg total) by mouth daily. Swallow whole. 90 tablet 3   atorvastatin (LIPITOR) 40 MG tablet Take 1 tablet (40 mg total) by mouth daily. 90 tablet 3   esomeprazole (NEXIUM) 40 MG capsule Take 40 mg by mouth daily before breakfast.        fluticasone (FLONASE) 50 MCG/ACT nasal spray Place 2 sprays into both nostrils daily. 16 g 6   ibuprofen (ADVIL,MOTRIN) 200 MG tablet Take 800 mg by mouth every 6 (six) hours as needed (for pain).      lamoTRIgine (LAMICTAL) 200 MG tablet Take 1 tablet by mouth once daily 90 tablet 0   ezetimibe (ZETIA) 10 MG tablet Take 1 tablet (10 mg total) by mouth daily. 90 tablet 3   metoprolol tartrate (LOPRESSOR) 25 MG tablet Take 1 tablet by mouth twice daily 180 tablet 0   nitroGLYCERIN (NITROSTAT) 0.4 MG SL tablet Place 1 tablet (0.4 mg total) under the tongue every 5 (five) minutes as needed for chest pain. 25 tablet 3   metoprolol tartrate (LOPRESSOR) 100 MG tablet Take one tablet two hours prior to the test (Patient not taking: Reported on 10/09/2021) 1 tablet 0   No facility-administered medications prior to visit.    Allergies  Allergen Reactions   Statins Other (See Comments)    Causes flushing   Simvastatin Other (See Comments)    Caused flushing    Review of Systems  Constitutional:  Negative for chills and fever.  HENT:  Negative for congestion, postnasal drip, sinus pressure and sore throat.   Eyes:  Negative for pain and discharge.  Respiratory:  Negative for cough and shortness of breath.   Cardiovascular:  Positive for chest pain. Negative for palpitations.  Gastrointestinal:  Negative for constipation, diarrhea, nausea and vomiting.  Endocrine: Negative for polydipsia and polyuria.  Genitourinary:  Negative for dysuria and hematuria.  Musculoskeletal:  Negative for neck pain and neck stiffness.  Skin:        Skin lesion on nose  Neurological:  Positive for headaches (Around right ear/scalp area). Negative for dizziness, weakness and numbness.  Psychiatric/Behavioral:  Negative for agitation and behavioral problems.        Objective:    Physical Exam Vitals reviewed.  Constitutional:      General: He is not in acute distress.    Appearance: He is not diaphoretic.   HENT:     Head: Normocephalic and atraumatic.     Nose: No congestion.     Mouth/Throat:     Mouth: Mucous membranes are moist.  Eyes:     General: No scleral icterus.    Extraocular Movements: Extraocular movements intact.  Cardiovascular:     Rate and Rhythm: Normal rate and regular rhythm.     Pulses: Normal pulses.     Heart sounds: Normal heart sounds. No murmur heard. Pulmonary:     Breath sounds: Normal breath sounds. No wheezing or rales.  Musculoskeletal:     Cervical back: Neck supple. No tenderness.     Right lower leg: No edema.     Left lower leg: No edema.  Skin:    General: Skin is warm.     Findings: Lesion (Circular papule noted over left nostril, about 1 cm in diameter) present. No rash.     Comments: Skin thickness over scalp area near right ear, s/p cyst excision  Neurological:     General: No focal deficit present.     Mental Status: He is alert and oriented to person, place, and time.  Psychiatric:        Mood and Affect: Mood normal.        Behavior: Behavior normal.     BP 132/84 (BP Location: Right Arm, Patient Position: Sitting, Cuff Size: Normal)   Pulse 65   Resp 18   Ht 6' (1.829 m)   Wt 230 lb (104.3 kg)   SpO2 96%   BMI 31.19 kg/m  Wt Readings from Last 3 Encounters:  10/09/21 230 lb (104.3 kg)  05/28/21 233 lb 3.2 oz (105.8 kg)  05/10/21 230 lb 6.4 oz (104.5 kg)        Assessment & Plan:   Problem List Items Addressed This Visit       Cardiovascular and Mediastinum   Hypertension    BP Readings from Last 1 Encounters:  10/09/21 132/84  Well-controlled Counseled for compliance with the medications Advised DASH diet and moderate exercise/walking, at least 150 mins/week      Relevant Medications   metoprolol tartrate (LOPRESSOR) 25 MG tablet   Coronary artery disease of native artery of native heart with stable angina pectoris (HCC)    Nonobstructive CAD Followed by cardiology On aspirin and statin On beta-blocker Has  episodes of chest pain, mostly chest soreness, likely MSK in etiology, although has nitroglycerin for as needed use       Relevant Medications   metoprolol tartrate (LOPRESSOR) 25 MG tablet     Musculoskeletal and Integument   Keratoacanthoma of nose - Primary    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 Referred to [place] dermatology      Relevant Orders   Ambulatory referral to Dermatology     Other   History of PSVT (paroxysmal supraventricular tachycardia)    S/p cardiac ablation in 2006 On Metoprolol 25 mg BID      Relevant Medications   metoprolol tartrate (LOPRESSOR) 25 MG tablet   Mixed hyperlipidemia    Lipid profile reviewed On Lipitor 40 mg daily now, did not have angioedema like reaction this time DC Zetia for now Advised to take fish oil as well      Relevant Medications   metoprolol tartrate (LOPRESSOR) 25 MG tablet   Tobacco abuse    Smokes about 0.25 pack/day, has 35-pack year smoking history  Asked about quitting: confirms that he/she currently smokes cigarettes Advise to quit smoking: Educated about QUITTING to reduce the risk of cancer, cardio and cerebrovascular disease. Assess willingness: Unwilling to quit at this time, but is working on cutting back. Assist with counseling and pharmacotherapy: Counseled for 5 minutes and literature provided. Arrange for follow up: follow up in 3 months and continue to offer help.      Refused influenza vaccine   Prefers to wait for colonoscopy and low-dose CT chest.   Meds ordered this encounter  Medications   metoprolol tartrate (LOPRESSOR) 25 MG tablet    Sig: Take 1 tablet (25 mg total) by mouth  2 (two) times daily.    Dispense:  180 tablet    Refill:  3     Sherrill Buikema Concha SeK Samadhi Mahurin, MD

## 2021-10-09 NOTE — Assessment & Plan Note (Signed)
Lipid profile reviewed On Lipitor 40 mg daily now, did not have angioedema like reaction this time DC Zetia for now Advised to take fish oil as well 

## 2021-10-30 ENCOUNTER — Ambulatory Visit: Payer: Commercial Managed Care - PPO | Admitting: Dermatology

## 2021-10-31 ENCOUNTER — Encounter: Payer: 59 | Admitting: Internal Medicine

## 2021-11-14 ENCOUNTER — Encounter: Payer: Self-pay | Admitting: Internal Medicine

## 2021-11-14 ENCOUNTER — Ambulatory Visit (INDEPENDENT_AMBULATORY_CARE_PROVIDER_SITE_OTHER): Payer: Self-pay | Admitting: Internal Medicine

## 2021-11-14 VITALS — BP 122/86 | HR 70 | Resp 18 | Ht 72.0 in | Wt 224.0 lb

## 2021-11-14 DIAGNOSIS — M25561 Pain in right knee: Secondary | ICD-10-CM

## 2021-11-14 DIAGNOSIS — Z87828 Personal history of other (healed) physical injury and trauma: Secondary | ICD-10-CM

## 2021-11-14 MED ORDER — TRAMADOL HCL 50 MG PO TABS: 50.0000 mg | ORAL_TABLET | Freq: Three times a day (TID) | ORAL | 0 refills | Status: AC | PRN

## 2021-11-14 NOTE — Patient Instructions (Signed)
Please take Tramadol as needed for severe knee pain.  Okay to take Tylenol or Ibuprofen for mild-moderate pain.  You are being referred to Orthopedic surgery.

## 2021-11-15 ENCOUNTER — Other Ambulatory Visit: Payer: Self-pay | Admitting: Registered Nurse

## 2021-11-15 ENCOUNTER — Ambulatory Visit (INDEPENDENT_AMBULATORY_CARE_PROVIDER_SITE_OTHER): Payer: Self-pay

## 2021-11-15 DIAGNOSIS — S8991XA Unspecified injury of right lower leg, initial encounter: Secondary | ICD-10-CM

## 2021-11-15 DIAGNOSIS — T1490XA Injury, unspecified, initial encounter: Secondary | ICD-10-CM

## 2021-11-15 NOTE — Assessment & Plan Note (Signed)
Acute right knee pain status post fall Decreased ROM and swelling on physical exam Likely has ligament tear Tramadol as needed for severe pain Referred to orthopedic surgeon

## 2021-11-15 NOTE — Progress Notes (Signed)
Acute Office Visit  Subjective:    Patient ID: [name], male    DOB: April 07, 1970, 51 y.o.   MRN: [id number]  Chief Complaint  Patient presents with   Knee Injury    Pt slid off back of tow truck 11-13-21 thinks he tore meniscus has done this before     HPI Patient is in today for evaluation after a fall on 11/13/21.  He slid off back of tote truck and outstretched his right knee.  He has had constant, sharp, 10/10 right knee pain, which is worse upon flexion/bending and better with rest and extension.  He currently has right knee swelling as well. He has had torn meniscus, s/p repair in the past.  He also reports numbness and tingling of the right leg.  He has tried taking Tylenol without much relief.  He denies any head injury.  Denies any LOC.  Denies any prodromal symptoms before the fall.  Past Medical History:  Diagnosis Date   Anxiety and depression    2009-suicidal ideation, involuntary commitment   Congestive heart failure ([place]) ?   Coronary artery disease    Gastroesophageal reflux disease    History of PSVT (paroxysmal supraventricular tachycardia)    s/p ablation 2006; occasional palpitations; occasional atypical left chest pain; event recorder in 03/2011-normal sinus, sinus tachycardia, single symptomatic spell with sinus rhythm   Hyperlipidemia    Intolerant to statins causing tongue swelling   Hypertension    Lab 02/2011: Normal CBC and BMet, TSH-1.6   Obstructive sleep apnea    Wears CPAP intermittantly. Followed by Dr. Damita Dunnings Pulmonology.    Past Surgical History:  Procedure Laterality Date   ANKLE SURGERY     Right   LEFT HEART CATHETERIZATION WITH CORONARY ANGIOGRAM N/A 02/21/2011   Procedure: LEFT HEART CATHETERIZATION WITH CORONARY ANGIOGRAM;  Surgeon: Sherren Mocha, MD;  Location: [facility] CATH LAB;  Service: Cardiovascular;  Laterality: N/A;   NASAL TURBINATE REDUCTION     SHOULDER SURGERY     Right    Family History  Problem Relation Age of  Onset   Heart disease Father    Heart attack Father 83   Heart disease Paternal Grandfather     Social History   Socioeconomic History   Marital status: Single    Spouse name: Not on file   Number of children: Not on file   Years of education: Not on file   Highest education level: Not on file  Occupational History   Occupation: Construction-grading  Tobacco Use   Smoking status: Every Day    Packs/day: 1.00    Years: 23.00    Total pack years: 23.00    Types: Cigarettes    Start date: 06/30/1987   Smokeless tobacco: Never  Vaping Use   Vaping Use: Never used  Substance and Sexual Activity   Alcohol use: No    Alcohol/week: 0.0 standard drinks of alcohol   Drug use: No   Sexual activity: Not on file  Other Topics Concern   Not on file  Social History Narrative   Lives with ex girlfriend.  Grown daughters.  7 grands.     Social Determinants of Health   Financial Resource Strain: Not on file  Food Insecurity: Not on file  Transportation Needs: Not on file  Physical Activity: Not on file  Stress: Not on file  Social Connections: Not on file  Intimate Partner Violence: Not on file    Outpatient Medications Prior to Visit  Medication  Sig Dispense Refill   albuterol (VENTOLIN HFA) 108 (90 Base) MCG/ACT inhaler Inhale 2 puffs into the lungs every 6 (six) hours as needed for wheezing or shortness of breath. 8 g 5   aspirin EC 81 MG tablet Take 1 tablet (81 mg total) by mouth daily. Swallow whole. 90 tablet 3   atorvastatin (LIPITOR) 40 MG tablet Take 1 tablet (40 mg total) by mouth daily. 90 tablet 3   esomeprazole (NEXIUM) 40 MG capsule Take 40 mg by mouth daily before breakfast.       fluticasone (FLONASE) 50 MCG/ACT nasal spray Place 2 sprays into both nostrils daily. 16 g 6   ibuprofen (ADVIL,MOTRIN) 200 MG tablet Take 800 mg by mouth every 6 (six) hours as needed (for pain).      lamoTRIgine (LAMICTAL) 200 MG tablet Take 1 tablet by mouth once daily 90 tablet 0    metoprolol tartrate (LOPRESSOR) 25 MG tablet Take 1 tablet (25 mg total) by mouth 2 (two) times daily. 180 tablet 3   nitroGLYCERIN (NITROSTAT) 0.4 MG SL tablet Place 1 tablet (0.4 mg total) under the tongue every 5 (five) minutes as needed for chest pain. 25 tablet 3   No facility-administered medications prior to visit.    Allergies  Allergen Reactions   Statins Other (See Comments)    Causes flushing   Simvastatin Other (See Comments)    Caused flushing    Review of Systems  Constitutional:  Negative for chills and fever.  HENT:  Negative for congestion, postnasal drip, sinus pressure and sore throat.   Eyes:  Negative for pain and discharge.  Respiratory:  Negative for cough and shortness of breath.   Cardiovascular:  Negative for chest pain and palpitations.  Gastrointestinal:  Negative for constipation, diarrhea, nausea and vomiting.  Endocrine: Negative for polydipsia and polyuria.  Genitourinary:  Negative for dysuria and hematuria.  Musculoskeletal:  Positive for arthralgias (Right knee). Negative for neck pain and neck stiffness.  Skin:        Skin lesion on nose  Neurological:  Positive for numbness and headaches (Around right ear/scalp area). Negative for dizziness and weakness.  Psychiatric/Behavioral:  Negative for agitation and behavioral problems.        Objective:    Physical Exam Vitals reviewed.  Constitutional:      General: He is not in acute distress.    Appearance: He is not diaphoretic.  HENT:     Head: Normocephalic and atraumatic.     Mouth/Throat:     Mouth: Mucous membranes are moist.  Eyes:     General: No scleral icterus.    Extraocular Movements: Extraocular movements intact.  Cardiovascular:     Rate and Rhythm: Normal rate and regular rhythm.     Pulses: Normal pulses.     Heart sounds: Normal heart sounds. No murmur heard. Pulmonary:     Breath sounds: Normal breath sounds. No wheezing or rales.  Musculoskeletal:     Cervical back:  Neck supple. No tenderness.     Right knee: Swelling present. Decreased range of motion. Tenderness present over the medial joint line and lateral joint line.     Right lower leg: No edema.     Left lower leg: No edema.  Skin:    General: Skin is warm.     Findings: Lesion (Circular papule noted over left nostril, about 1 cm in diameter) present. No rash.     Comments: Skin thickness over scalp area near right ear, s/p cyst  excision  Neurological:     General: No focal deficit present.     Mental Status: He is alert and oriented to person, place, and time.  Psychiatric:        Mood and Affect: Mood normal.        Behavior: Behavior normal.     BP 122/86 (BP Location: Right Arm, Patient Position: Sitting, Cuff Size: Normal)   Pulse 70   Resp 18   Ht 6' (1.829 m)   Wt 224 lb (101.6 kg)   SpO2 96%   BMI 30.38 kg/m  Wt Readings from Last 3 Encounters:  11/14/21 224 lb (101.6 kg)  10/09/21 230 lb (104.3 kg)  05/28/21 233 lb 3.2 oz (105.8 kg)        Assessment & Plan:   Problem List Items Addressed This Visit       Other   Acute pain of right knee - Primary    Acute right knee pain status post fall Decreased ROM and swelling on physical exam Likely has ligament tear Tramadol as needed for severe pain Referred to orthopedic surgeon      Relevant Medications   traMADol (ULTRAM) 50 MG tablet   Other Relevant Orders   Ambulatory referral to Orthopedic Surgery   Other Visit Diagnoses     History of meniscal tear     S/p repair Risk factor for recurrence of meniscal tear   Relevant Medications   traMADol (ULTRAM) 50 MG tablet   Other Relevant Orders   Ambulatory referral to Orthopedic Surgery        Meds ordered this encounter  Medications   traMADol (ULTRAM) 50 MG tablet    Sig: Take 1 tablet (50 mg total) by mouth every 8 (eight) hours as needed for up to 5 days.    Dispense:  15 tablet    Refill:  0     Ilhan Madan Concha Se, MD

## 2021-11-20 ENCOUNTER — Other Ambulatory Visit: Payer: Self-pay | Admitting: Internal Medicine

## 2021-11-20 DIAGNOSIS — M792 Neuralgia and neuritis, unspecified: Secondary | ICD-10-CM

## 2021-11-29 ENCOUNTER — Ambulatory Visit: Payer: Commercial Managed Care - PPO | Admitting: Cardiology

## 2022-01-09 ENCOUNTER — Ambulatory Visit: Payer: Commercial Managed Care - PPO | Admitting: Internal Medicine

## 2022-01-09 ENCOUNTER — Encounter: Payer: Self-pay | Admitting: Internal Medicine

## 2022-01-20 ENCOUNTER — Emergency Department (HOSPITAL_COMMUNITY)
Admission: EM | Admit: 2022-01-20 | Discharge: 2022-01-20 | Disposition: A | Discharge status: Home / Self Care | Payer: Commercial Managed Care - PPO | Attending: Emergency Medicine | Admitting: Emergency Medicine

## 2022-01-20 ENCOUNTER — Encounter (HOSPITAL_COMMUNITY): Payer: Self-pay

## 2022-01-20 ENCOUNTER — Other Ambulatory Visit: Payer: Self-pay

## 2022-01-20 ENCOUNTER — Emergency Department (HOSPITAL_COMMUNITY): Payer: Commercial Managed Care - PPO

## 2022-01-20 DIAGNOSIS — J111 Influenza due to unidentified influenza virus with other respiratory manifestations: Secondary | ICD-10-CM | POA: Insufficient documentation

## 2022-01-20 DIAGNOSIS — R059 Cough, unspecified: Secondary | ICD-10-CM | POA: Diagnosis present

## 2022-01-20 DIAGNOSIS — Z7982 Long term (current) use of aspirin: Secondary | ICD-10-CM | POA: Diagnosis not present

## 2022-01-20 DIAGNOSIS — F17200 Nicotine dependence, unspecified, uncomplicated: Secondary | ICD-10-CM | POA: Diagnosis not present

## 2022-01-20 DIAGNOSIS — Z1152 Encounter for screening for COVID-19: Secondary | ICD-10-CM | POA: Insufficient documentation

## 2022-01-20 DIAGNOSIS — J40 Bronchitis, not specified as acute or chronic: Secondary | ICD-10-CM | POA: Diagnosis not present

## 2022-01-20 LAB — RESP PANEL BY RT-PCR (RSV, FLU A&B, COVID)  RVPGX2
Influenza A by PCR: NEGATIVE
Influenza B by PCR: NEGATIVE
Resp Syncytial Virus by PCR: NEGATIVE
SARS Coronavirus 2 by RT PCR: NEGATIVE

## 2022-01-20 MED ORDER — ALBUTEROL SULFATE HFA 108 (90 BASE) MCG/ACT IN AERS
2.0000 | INHALATION_SPRAY | RESPIRATORY_TRACT | Status: DC
  Administered 2022-01-20: 2 via RESPIRATORY_TRACT
  Filled 2022-01-20: qty 6.7

## 2022-01-20 MED ORDER — PREDNISONE 50 MG PO TABS: ORAL_TABLET | ORAL | 0 refills | Status: DC

## 2022-01-20 MED ORDER — PREDNISONE 50 MG PO TABS
60.0000 mg | ORAL_TABLET | Freq: Once | ORAL | Status: AC
  Administered 2022-01-20: 60 mg via ORAL
  Filled 2022-01-20: qty 1

## 2022-01-20 MED ORDER — IPRATROPIUM-ALBUTEROL 0.5-2.5 (3) MG/3ML IN SOLN
3.0000 mL | Freq: Once | RESPIRATORY_TRACT | Status: AC
  Administered 2022-01-20: 3 mL via RESPIRATORY_TRACT
  Filled 2022-01-20: qty 3

## 2022-01-20 NOTE — Discharge Instructions (Addendum)
See your Physicain for recheck in 3-4 days if not resolved

## 2022-01-20 NOTE — ED Triage Notes (Signed)
Pt has had fever, cough, bodyaches, sore throat x 5 days and feels no improvement.

## 2022-01-20 NOTE — ED Provider Notes (Signed)
[facility] EMERGENCY DEPARTMENT Provider Note   CSN: [id number] Arrival date & time: 01/20/22  2038     History  Chief Complaint  Patient presents with   URI    [name] is a 51 y.o. male.  Pt complains of a cough and congestion.  Pt reports he has had a fever.  Pt exposed to Influenza.  Pt reports cough has gotten worse.  Pt is a smoker.    The history is provided by the patient. No language interpreter was used.  URI Presenting symptoms: congestion and cough   Severity:  Moderate Onset quality:  Gradual Duration:  5 days Timing:  Constant Progression:  Worsening Chronicity:  New Relieved by:  None tried Worsened by:  Nothing Ineffective treatments:  None tried Risk factors: sick contacts        Home Medications Prior to Admission medications   Medication Sig Start Date End Date Taking? Authorizing Provider  albuterol (VENTOLIN HFA) 108 (90 Base) MCG/ACT inhaler Inhale 2 puffs into the lungs every 6 (six) hours as needed for wheezing or shortness of breath. 04/25/21   Anabel Halon, MD  aspirin EC 81 MG tablet Take 1 tablet (81 mg total) by mouth daily. Swallow whole. 05/10/21   Rollene Rotunda, MD  atorvastatin (LIPITOR) 40 MG tablet Take 1 tablet (40 mg total) by mouth daily. 05/28/21   Rollene Rotunda, MD  esomeprazole (NEXIUM) 40 MG capsule Take 40 mg by mouth daily before breakfast.      [provider]  fluticasone (FLONASE) 50 MCG/ACT nasal spray Place 2 sprays into both nostrils daily. 03/07/21   Anabel Halon, MD  ibuprofen (ADVIL,MOTRIN) 200 MG tablet Take 800 mg by mouth every 6 (six) hours as needed (for pain).     [provider]  lamoTRIgine (LAMICTAL) 200 MG tablet Take 1 tablet by mouth once daily 11/20/21   Anabel Halon, MD  metoprolol tartrate (LOPRESSOR) 25 MG tablet Take 1 tablet (25 mg total) by mouth 2 (two) times daily. 10/09/21   Anabel Halon, MD  nitroGLYCERIN (NITROSTAT) 0.4 MG SL tablet Place 1 tablet (0.4 mg  total) under the tongue every 5 (five) minutes as needed for chest pain. 05/10/21 08/08/21  Rollene Rotunda, MD      Allergies    Statins and Simvastatin    Review of Systems   Review of Systems  HENT:  Positive for congestion.   Respiratory:  Positive for cough.   All other systems reviewed and are negative.   Physical Exam Updated Vital Signs BP (!) 171/90 (BP Location: Right Arm)   Pulse 72   Temp 97.8 F (36.6 C) (Oral)   Resp 18   Ht 6' (1.829 m)   Wt 102.1 kg   SpO2 97%   BMI 30.52 kg/m  Physical Exam Vitals and nursing note reviewed.  Constitutional:      Appearance: He is well-developed.  HENT:     Head: Normocephalic.     Mouth/Throat:     Mouth: Mucous membranes are moist.  Eyes:     Pupils: Pupils are equal, round, and reactive to light.  Cardiovascular:     Rate and Rhythm: Normal rate.  Pulmonary:     Effort: Pulmonary effort is normal.     Breath sounds: Wheezing and rhonchi present.  Abdominal:     General: There is no distension.  Musculoskeletal:        General: Normal range of motion.     Cervical  back: Normal range of motion.  Skin:    General: Skin is warm.  Neurological:     General: No focal deficit present.     Mental Status: He is alert and oriented to person, place, and time.  Psychiatric:        Mood and Affect: Mood normal.     ED Results / Procedures / Treatments   Labs (all labs ordered are listed, but only abnormal results are displayed) Labs Reviewed  RESP PANEL BY RT-PCR (RSV, FLU A&B, COVID)  [id number]    EKG None  Radiology DG Chest 2 View  Result Date: 01/20/2022 CLINICAL DATA:  Cough. EXAM: CHEST - 2 VIEW COMPARISON:  Chest radiograph dated April 28, 2021 FINDINGS: The heart size and mediastinal contours are within normal limits. Both lungs are clear. The visualized skeletal structures are unremarkable. IMPRESSION: No active cardiopulmonary disease. Electronically Signed   By: Larose Hires D.O.   On: 01/20/2022 22:07     Procedures Procedures    Medications Ordered in ED Medications - No data to display  ED Course/ Medical Decision Making/ A&P                           Medical Decision Making Pt complains of a cough and congestion.  Pt exposed to influenza.    Amount and/or Complexity of Data Reviewed Labs: ordered. Decision-making details documented in ED Course.    Details: Covid, influenza and rsv are negative  Radiology: ordered and independent interpretation performed. Decision-making details documented in ED Course.    Details: Chest xray shows no evidence of pneumonia   Risk Prescription drug management. Risk Details: Pt given a duoneb here, prednisone 60mg .  Pt given an albuterol inhaler and rx for prednisone.             Final Clinical Impression(s) / ED Diagnoses Final diagnoses:  Bronchitis    Rx / DC Orders ED Discharge Orders          Ordered    predniSONE (DELTASONE) 50 MG tablet        01/20/22 2258          An After Visit Summary was printed and given to the patient.     2259, PA-C 01/20/22 2258    2259, MD 01/20/22 [phone]

## 2022-01-20 NOTE — ED Notes (Signed)
Patient transported to X-ray 

## 2022-01-23 ENCOUNTER — Telehealth: Payer: Self-pay | Admitting: Internal Medicine

## 2022-01-23 ENCOUNTER — Ambulatory Visit (INDEPENDENT_AMBULATORY_CARE_PROVIDER_SITE_OTHER): Payer: Commercial Managed Care - PPO | Admitting: Family Medicine

## 2022-01-23 ENCOUNTER — Encounter: Payer: Self-pay | Admitting: Family Medicine

## 2022-01-23 VITALS — BP 132/94 | HR 71 | Ht 72.0 in | Wt 231.0 lb

## 2022-01-23 DIAGNOSIS — B9689 Other specified bacterial agents as the cause of diseases classified elsewhere: Secondary | ICD-10-CM

## 2022-01-23 DIAGNOSIS — I10 Essential (primary) hypertension: Secondary | ICD-10-CM

## 2022-01-23 DIAGNOSIS — R7301 Impaired fasting glucose: Secondary | ICD-10-CM | POA: Diagnosis not present

## 2022-01-23 DIAGNOSIS — J019 Acute sinusitis, unspecified: Secondary | ICD-10-CM | POA: Diagnosis not present

## 2022-01-23 DIAGNOSIS — J209 Acute bronchitis, unspecified: Secondary | ICD-10-CM

## 2022-01-23 DIAGNOSIS — Z1159 Encounter for screening for other viral diseases: Secondary | ICD-10-CM

## 2022-01-23 DIAGNOSIS — E785 Hyperlipidemia, unspecified: Secondary | ICD-10-CM

## 2022-01-23 DIAGNOSIS — J329 Chronic sinusitis, unspecified: Secondary | ICD-10-CM

## 2022-01-23 MED ORDER — AMOXICILLIN-POT CLAVULANATE 875-125 MG PO TABS: 1.0000 | ORAL_TABLET | Freq: Two times a day (BID) | ORAL | 0 refills | Status: DC

## 2022-01-23 MED ORDER — GUAIFENESIN 100 MG/5ML PO LIQD: 5.0000 mL | ORAL | 0 refills | Status: DC | PRN

## 2022-01-23 MED ORDER — PREDNISONE 20 MG PO TABS: 20.0000 mg | ORAL_TABLET | Freq: Every day | ORAL | 0 refills | Status: DC

## 2022-01-23 MED ORDER — AZELASTINE-FLUTICASONE 137-50 MCG/ACT NA SUSP: 1.0000 | Freq: Two times a day (BID) | NASAL | 0 refills | Status: DC

## 2022-01-23 MED ORDER — ALBUTEROL SULFATE HFA 108 (90 BASE) MCG/ACT IN AERS: 2.0000 | INHALATION_SPRAY | Freq: Four times a day (QID) | RESPIRATORY_TRACT | 2 refills | Status: AC | PRN

## 2022-01-23 NOTE — Patient Instructions (Addendum)
Please take your medications as prescribed Follow up in 2 weeks with Dr. Posey Pronto for your HTN Medications If symptoms get worse please contact your primary care provider

## 2022-01-23 NOTE — Progress Notes (Signed)
Acute Office Visit  Subjective:     Patient ID: [name], male    DOB: 05-09-1970, 52 y.o.   MRN: [id number]  Chief Complaint  Patient presents with   Hypertension    Patient states his BP was elevated starting a month ago. Was started on metoprolol, says it helps in the morning but BP raises again at night.    Influenza    Patient states he started with symptoms 8 days ago. Went to the ED on 12/31 for SOB, + for flu, got prednisone and albuterol with no improvement.    [name] 52 year old male, present to the clinic with shortness of breath and productive cough gradually worsening for the past 8 days. Patient went to the ED on 12/31 prescribed prednisone and albuterol with no relief of symptoms. Sinus Problem This is a new problem. The current episode started 1 to 4 weeks ago. The problem has been gradually worsening since onset. The maximum temperature recorded prior to his arrival was 100.4 - 100.9 F. The fever has been present for 1 to 2 days. His pain is at a severity of 7/10. The pain is moderate. Associated symptoms include chills, congestion, coughing, headaches, a hoarse voice, shortness of breath, sinus pressure, sneezing and a sore throat. Pertinent negatives include no ear pain. (Green and yellow cough) Past treatments include saline nose sprays and acetaminophen (predisone and albuterol). The treatment provided mild relief.     Review of Systems  Constitutional:  Positive for chills.  HENT:  Positive for congestion, hoarse voice, sinus pressure, sneezing and sore throat. Negative for ear pain.   Respiratory:  Positive for cough and shortness of breath.   Neurological:  Positive for headaches.        Objective:    BP (!) 132/94   Pulse 71   Ht 6' (1.829 m)   Wt 231 lb (104.8 kg)   SpO2 94%   BMI 31.33 kg/m  BP Readings from Last 3 Encounters:  01/23/22 (!) 132/94  01/20/22 130/82  11/14/21 122/86      Physical Exam HENT:     Right Ear: Tympanic  membrane normal.     Left Ear: Tympanic membrane normal.     Nose: Congestion present.     Mouth/Throat:     Mouth: Mucous membranes are moist.     Pharynx: Oropharynx is clear. Posterior oropharyngeal erythema present.  Eyes:     General:        Right eye: No discharge.        Left eye: No discharge.     Pupils: Pupils are equal, round, and reactive to light.  Cardiovascular:     Rate and Rhythm: Normal rate.     Pulses: Normal pulses.     Heart sounds: No murmur heard. Pulmonary:     Effort: No tachypnea or bradypnea.     Breath sounds: Decreased air movement present. Examination of the right-upper field reveals decreased breath sounds. Examination of the left-upper field reveals decreased breath sounds. Decreased breath sounds present. No wheezing, rhonchi or rales.  Musculoskeletal:     Cervical back: Normal range of motion.  Skin:    General: Skin is warm and dry.     Capillary Refill: Capillary refill takes less than 2 seconds.  Neurological:     Mental Status: He is alert.  Psychiatric:        Mood and Affect: Mood normal.     No results found for any visits  on 01/23/22.      Assessment & Plan:   Problem List Items Addressed This Visit       Cardiovascular and Mediastinum   Hypertension   Relevant Orders   Microalbumin / creatinine urine ratio   CMP14+EGFR     Respiratory   Rhinosinusitis (Chronic)   Relevant Medications   guaiFENesin (ROBITUSSIN) 100 MG/5ML liquid   amoxicillin-clavulanate (AUGMENTIN) 875-125 MG tablet   predniSONE (DELTASONE) 20 MG tablet   Azelastine-Fluticasone 137-50 MCG/ACT SUSP   Acute bacterial rhinosinusitis - Primary    Light yellow Rhinorrhea noted, Tenderness on light palpation of the face, productive cough with green sputum.      Relevant Medications   guaiFENesin (ROBITUSSIN) 100 MG/5ML liquid   amoxicillin-clavulanate (AUGMENTIN) 875-125 MG tablet   albuterol (VENTOLIN HFA) 108 (90 Base) MCG/ACT inhaler   predniSONE  (DELTASONE) 20 MG tablet   Azelastine-Fluticasone 137-50 MCG/ACT SUSP   Other Visit Diagnoses     Hyperlipidemia, unspecified hyperlipidemia type       Relevant Orders   Lipid panel   CBC with Differential/Platelet   IFG (impaired fasting glucose)       Relevant Orders   Hemoglobin A1c   Encounter for hepatitis C screening test for low risk patient       Relevant Orders   Hepatitis C Antibody   Acute bronchitis, unspecified organism           Meds ordered this encounter  Medications   guaiFENesin (ROBITUSSIN) 100 MG/5ML liquid    Sig: Take 5 mLs by mouth every 4 (four) hours as needed for cough or to loosen phlegm.    Dispense:  120 mL    Refill:  0   amoxicillin-clavulanate (AUGMENTIN) 875-125 MG tablet    Sig: Take 1 tablet by mouth 2 (two) times daily.    Dispense:  20 tablet    Refill:  0   albuterol (VENTOLIN HFA) 108 (90 Base) MCG/ACT inhaler    Sig: Inhale 2 puffs into the lungs every 6 (six) hours as needed for wheezing or shortness of breath.    Dispense:  8 g    Refill:  2   predniSONE (DELTASONE) 20 MG tablet    Sig: Take 1 tablet (20 mg total) by mouth daily with breakfast.    Dispense:  10 tablet    Refill:  0   Azelastine-Fluticasone 137-50 MCG/ACT SUSP    Sig: Place 1 spray into the nose every 12 (twelve) hours.    Dispense:  23 g    Refill:  0    No follow-ups on file.  [name] Ria Comment, FNP

## 2022-01-23 NOTE — Telephone Encounter (Signed)
Transition Care Management Unsuccessful Follow-up Telephone Call  Date of discharge and from where:  12/31 [facility]  Attempts:  1st Attempt  Reason for unsuccessful TCM follow-up call:  Unable to reach patient- patient has appt at 11:00 today

## 2022-01-23 NOTE — Assessment & Plan Note (Signed)
Light yellow Rhinorrhea noted, Tenderness on light palpation of the face, productive cough with green sputum.

## 2022-01-24 ENCOUNTER — Telehealth: Payer: Self-pay

## 2022-01-24 ENCOUNTER — Other Ambulatory Visit: Payer: Self-pay | Admitting: Internal Medicine

## 2022-01-24 DIAGNOSIS — I10 Essential (primary) hypertension: Secondary | ICD-10-CM

## 2022-01-24 MED ORDER — TELMISARTAN 20 MG PO TABS: 20.0000 mg | ORAL_TABLET | Freq: Every day | ORAL | 0 refills | Status: DC

## 2022-01-25 LAB — CMP14+EGFR
ALT: 56 IU/L — ABNORMAL HIGH (ref 0–44)
AST: 34 IU/L (ref 0–40)
Albumin/Globulin Ratio: 1.8 (ref 1.2–2.2)
Albumin: 4.4 g/dL (ref 4.1–5.1)
Alkaline Phosphatase: 113 IU/L (ref 44–121)
BUN/Creatinine Ratio: 14 (ref 9–20)
BUN: 13 mg/dL (ref 6–24)
Bilirubin Total: 0.2 mg/dL (ref 0.0–1.2)
CO2: 21 mmol/L (ref 20–29)
Calcium: 9.5 mg/dL (ref 8.7–10.2)
Chloride: 109 mmol/L — ABNORMAL HIGH (ref 96–106)
Creatinine, Ser: 0.91 mg/dL (ref 0.76–1.27)
Globulin, Total: 2.5 g/dL (ref 1.5–4.5)
Glucose: 145 mg/dL — ABNORMAL HIGH (ref 70–99)
Potassium: 4.3 mmol/L (ref 3.5–5.2)
Sodium: 139 mmol/L (ref 134–144)
Total Protein: 6.9 g/dL (ref 6.0–8.5)
eGFR: 103 mL/min/{1.73_m2} (ref >59)

## 2022-01-25 LAB — CBC WITH DIFFERENTIAL/PLATELET
Basophils Absolute: 0 10*3/uL (ref 0.0–0.2)
Basos: 0 %
EOS (ABSOLUTE): 0 10*3/uL (ref 0.0–0.4)
Eos: 0 %
Hematocrit: 45.8 % (ref 37.5–51.0)
Hemoglobin: 16 g/dL (ref 13.0–17.7)
Immature Grans (Abs): 0.1 10*3/uL (ref 0.0–0.1)
Immature Granulocytes: 1 %
Lymphocytes Absolute: 1.5 10*3/uL (ref 0.7–3.1)
Lymphs: 13 %
MCH: 31.4 pg (ref 26.6–33.0)
MCHC: 34.9 g/dL (ref 31.5–35.7)
MCV: 90 fL (ref 79–97)
Monocytes Absolute: 0.5 10*3/uL (ref 0.1–0.9)
Monocytes: 4 %
Neutrophils Absolute: 9.5 10*3/uL — ABNORMAL HIGH (ref 1.4–7.0)
Neutrophils: 82 %
Platelets: 370 10*3/uL (ref 150–450)
RBC: 5.09 x10E6/uL (ref 4.14–5.80)
RDW: 13.1 % (ref 11.6–15.4)
WBC: 11.7 10*3/uL — ABNORMAL HIGH (ref 3.4–10.8)

## 2022-01-25 LAB — MICROALBUMIN / CREATININE URINE RATIO
Creatinine, Urine: 86.7 mg/dL
Microalb/Creat Ratio: 56 mg/g creat — ABNORMAL HIGH (ref 0–29)
Microalbumin, Urine: 48.6 ug/mL

## 2022-01-25 LAB — LIPID PANEL
Chol/HDL Ratio: 4.2 ratio (ref 0.0–5.0)
Cholesterol, Total: 159 mg/dL (ref 100–199)
HDL: 38 mg/dL — ABNORMAL LOW (ref >39)
LDL Chol Calc (NIH): 79 mg/dL (ref 0–99)
Triglycerides: 252 mg/dL — ABNORMAL HIGH (ref 0–149)
VLDL Cholesterol Cal: 42 mg/dL — ABNORMAL HIGH (ref 5–40)

## 2022-01-25 LAB — HEMOGLOBIN A1C
Est. average glucose Bld gHb Est-mCnc: 120 mg/dL
Hgb A1c MFr Bld: 5.8 % — ABNORMAL HIGH (ref 4.8–5.6)

## 2022-01-25 LAB — HEPATITIS C ANTIBODY: Hep C Virus Ab: NONREACTIVE

## 2022-01-30 NOTE — Telephone Encounter (Signed)
error 

## 2022-02-07 ENCOUNTER — Ambulatory Visit (INDEPENDENT_AMBULATORY_CARE_PROVIDER_SITE_OTHER): Payer: Commercial Managed Care - PPO | Admitting: Internal Medicine

## 2022-02-07 ENCOUNTER — Encounter: Payer: Self-pay | Admitting: Internal Medicine

## 2022-02-07 VITALS — BP 131/86 | HR 66 | Ht 72.0 in | Wt 236.0 lb

## 2022-02-07 DIAGNOSIS — E782 Mixed hyperlipidemia: Secondary | ICD-10-CM

## 2022-02-07 DIAGNOSIS — M792 Neuralgia and neuritis, unspecified: Secondary | ICD-10-CM | POA: Diagnosis not present

## 2022-02-07 DIAGNOSIS — Z0001 Encounter for general adult medical examination with abnormal findings: Secondary | ICD-10-CM | POA: Diagnosis not present

## 2022-02-07 DIAGNOSIS — J329 Chronic sinusitis, unspecified: Secondary | ICD-10-CM | POA: Diagnosis not present

## 2022-02-07 DIAGNOSIS — Z8679 Personal history of other diseases of the circulatory system: Secondary | ICD-10-CM

## 2022-02-07 DIAGNOSIS — I25118 Atherosclerotic heart disease of native coronary artery with other forms of angina pectoris: Secondary | ICD-10-CM

## 2022-02-07 DIAGNOSIS — Z2821 Immunization not carried out because of patient refusal: Secondary | ICD-10-CM

## 2022-02-07 DIAGNOSIS — R809 Proteinuria, unspecified: Secondary | ICD-10-CM

## 2022-02-07 DIAGNOSIS — I10 Essential (primary) hypertension: Secondary | ICD-10-CM

## 2022-02-07 DIAGNOSIS — Z720 Tobacco use: Secondary | ICD-10-CM

## 2022-02-07 MED ORDER — LAMOTRIGINE 200 MG PO TABS: 200.0000 mg | ORAL_TABLET | Freq: Every day | ORAL | 1 refills | Status: DC

## 2022-02-07 MED ORDER — TELMISARTAN 20 MG PO TABS: 20.0000 mg | ORAL_TABLET | Freq: Every day | ORAL | 1 refills | Status: DC

## 2022-02-07 NOTE — Assessment & Plan Note (Addendum)
Smokes about 2 cigarettes/day, has 35-pack year smoking history  Asked about quitting: confirms that he/she currently smokes cigarettes Advise to quit smoking: Educated about QUITTING to reduce the risk of cancer, cardio and cerebrovascular disease. Assess willingness: Unwilling to quit at this time, but is working on cutting back. Assist with counseling and pharmacotherapy: Counseled for 5 minutes and literature provided. Arrange for follow up: follow up in 3 months and continue to offer help.

## 2022-02-07 NOTE — Assessment & Plan Note (Signed)
Right periauricular area since cyst resection On Lamictal, well-controlled currently

## 2022-02-07 NOTE — Assessment & Plan Note (Signed)
BP Readings from Last 1 Encounters:  02/07/22 131/86   Well-controlled Counseled for compliance with the medications Advised DASH diet and moderate exercise/walking, at least 150 mins/week

## 2022-02-07 NOTE — Assessment & Plan Note (Signed)
S/p cardiac ablation in 2006 On Metoprolol 25 mg BID

## 2022-02-07 NOTE — Assessment & Plan Note (Signed)
Lipid profile reviewed On Lipitor 40 mg daily now, did not have angioedema like reaction this time DC Zetia for now Advised to take fish oil as well

## 2022-02-07 NOTE — Assessment & Plan Note (Signed)
His sinus symptoms are likely due to allergies Continue Azelastine-fluticasone nasal spray for allergies Albuterol as needed for dyspnea/wheezing, could be related to bronchitis

## 2022-02-07 NOTE — Assessment & Plan Note (Signed)
Slightly higher urine microalbumin, although it was checked when he had URTI/acute bronchitis On ARB

## 2022-02-07 NOTE — Patient Instructions (Signed)
Please continue to take medications as prescribed.  Please continue to follow low salt diet and ambulate as tolerated.  Please use Fluticazone-azelastine nasal spray as prescribed for nasal congestion and allergies. Please perform sinus rinse for nasal congestion.

## 2022-02-07 NOTE — Assessment & Plan Note (Signed)
Physical exam as documented. Fasting blood tests reviewed. Prefers to wait for colonoscopy and Shingrix vaccine till he gets new insurance.

## 2022-02-07 NOTE — Assessment & Plan Note (Signed)
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

## 2022-02-07 NOTE — Progress Notes (Signed)
Established Patient Office Visit  Subjective:  Patient ID: [name], male    DOB: [birth year]  Age: 52 y.o. MRN: [id number]  CC:  Chief Complaint  Patient presents with   Annual Exam    HPI [name] is a 52 y.o. male with past medical history of PSVT s/p cardiac ablation, HTN, scalp cyst s/p excision, scalp neuralgia, GERD and HLD who presents for annual physical.  He complains of nasal congestion, sinus pressure related headache and postnasal drip for about 1 month now.  He has been treated with antibiotic and steroids, but has recurrent sinus issues.  He denies any fever, chills, dyspnea or wheezing currently.  He has tried using Flonase with some relief.  He has history of PSVT s/p cardiac ablation.  He denies any chest pain, dyspnea or palpitations currently.  He is on metoprolol for PSVT and HTN.   He has history of a scalp cyst, which was excised by his dermatologist in the past.  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  Denies any dizziness, ear pain or ear discharge currently.  He has been taking Lamictal for scalp neuralgia since then.     Past Medical History:  Diagnosis Date   Anxiety and depression    2009-suicidal ideation, involuntary commitment   Congestive heart failure (HCC) ?   Coronary artery disease    Gastroesophageal reflux disease    History of PSVT (paroxysmal supraventricular tachycardia)    s/p ablation 2006; occasional palpitations; occasional atypical left chest pain; event recorder in 03/2011-normal sinus, sinus tachycardia, single symptomatic spell with sinus rhythm   Hyperlipidemia    Intolerant to statins causing tongue swelling   Hypertension    Lab 02/2011: Normal CBC and BMet, TSH-1.6   Obstructive sleep apnea    Wears CPAP intermittantly. Followed by Dr. Gildardo Cranker Pulmonology.    Past Surgical History:  Procedure Laterality Date   ANKLE SURGERY     Right   LEFT HEART CATHETERIZATION WITH CORONARY  ANGIOGRAM N/A 02/21/2011   Procedure: LEFT HEART CATHETERIZATION WITH CORONARY ANGIOGRAM;  Surgeon: Tonny Bollman, MD;  Location: [facility] CATH LAB;  Service: Cardiovascular;  Laterality: N/A;   NASAL TURBINATE REDUCTION     SHOULDER SURGERY     Right    Family History  Problem Relation Age of Onset   Heart disease Father    Heart attack Father 48   Heart disease Paternal Grandfather     Social History   Socioeconomic History   Marital status: Single    Spouse name: Not on file   Number of children: Not on file   Years of education: Not on file   Highest education level: Not on file  Occupational History   Occupation: Construction-grading  Tobacco Use   Smoking status: Every Day    Packs/day: 1.00    Years: 23.00    Total pack years: 23.00    Types: Cigarettes    Start date: 06/30/1987   Smokeless tobacco: Never  Vaping Use   Vaping Use: Never used  Substance and Sexual Activity   Alcohol use: No    Alcohol/week: 0.0 standard drinks of alcohol   Drug use: No   Sexual activity: Not on file  Other Topics Concern   Not on file  Social History Narrative   Lives with ex girlfriend.  Grown daughters.  7 grands.     Social Determinants of Health   Financial Resource Strain: Not on file  Food Insecurity: Not on file  Transportation Needs: Not on file  Physical Activity: Not on file  Stress: Not on file  Social Connections: Not on file  Intimate Partner Violence: Not on file    Outpatient Medications Prior to Visit  Medication Sig Dispense Refill   albuterol (VENTOLIN HFA) 108 (90 Base) MCG/ACT inhaler Inhale 2 puffs into the lungs every 6 (six) hours as needed for wheezing or shortness of breath. 8 g 2   aspirin EC 81 MG tablet Take 1 tablet (81 mg total) by mouth daily. Swallow whole. 90 tablet 3   atorvastatin (LIPITOR) 40 MG tablet Take 1 tablet (40 mg total) by mouth daily. 90 tablet 3   Azelastine-Fluticasone 137-50 MCG/ACT SUSP Place 1 spray into the nose every 12  (twelve) hours. 23 g 0   esomeprazole (NEXIUM) 40 MG capsule Take 40 mg by mouth daily before breakfast.       ibuprofen (ADVIL,MOTRIN) 200 MG tablet Take 800 mg by mouth every 6 (six) hours as needed (for pain).      metoprolol tartrate (LOPRESSOR) 25 MG tablet Take 1 tablet (25 mg total) by mouth 2 (two) times daily. 180 tablet 3   nitroGLYCERIN (NITROSTAT) 0.4 MG SL tablet Place 1 tablet (0.4 mg total) under the tongue every 5 (five) minutes as needed for chest pain. 25 tablet 3   amoxicillin-clavulanate (AUGMENTIN) 875-125 MG tablet Take 1 tablet by mouth 2 (two) times daily. 20 tablet 0   guaiFENesin (ROBITUSSIN) 100 MG/5ML liquid Take 5 mLs by mouth every 4 (four) hours as needed for cough or to loosen phlegm. 120 mL 0   lamoTRIgine (LAMICTAL) 200 MG tablet Take 1 tablet by mouth once daily 90 tablet 0   predniSONE (DELTASONE) 20 MG tablet Take 1 tablet (20 mg total) by mouth daily with breakfast. 10 tablet 0   telmisartan (MICARDIS) 20 MG tablet Take 1 tablet (20 mg total) by mouth daily. 30 tablet 0   No facility-administered medications prior to visit.    Allergies  Allergen Reactions   Statins Other (See Comments)    Causes flushing   Simvastatin Other (See Comments)    Caused flushing    ROS Review of Systems  Constitutional:  Negative for chills and fever.  HENT:  Positive for congestion, postnasal drip and sinus pressure. Negative for sore throat.   Eyes:  Negative for pain and discharge.  Respiratory:  Negative for cough and shortness of breath.   Cardiovascular:  Negative for chest pain and palpitations.  Gastrointestinal:  Negative for constipation, diarrhea, nausea and vomiting.  Endocrine: Negative for polydipsia and polyuria.  Genitourinary:  Negative for dysuria and hematuria.  Musculoskeletal:  Negative for neck pain and neck stiffness.  Skin:  Negative for rash.  Neurological:  Positive for headaches (Around right ear/scalp area). Negative for dizziness,  weakness and numbness.  Psychiatric/Behavioral:  Negative for agitation and behavioral problems.       Objective:    Physical Exam Vitals reviewed.  Constitutional:      General: He is not in acute distress.    Appearance: He is not diaphoretic.  HENT:     Head: Normocephalic and atraumatic.     Nose: Congestion present.     Mouth/Throat:     Mouth: Mucous membranes are moist.  Eyes:     General: No scleral icterus.    Extraocular Movements: Extraocular movements intact.  Cardiovascular:     Rate and Rhythm: Normal rate and regular rhythm.  Pulses: Normal pulses.     Heart sounds: Normal heart sounds. No murmur heard. Pulmonary:     Breath sounds: Normal breath sounds. No wheezing or rales.  Musculoskeletal:     Cervical back: Neck supple. No tenderness.     Right lower leg: No edema.     Left lower leg: No edema.  Skin:    General: Skin is warm.     Findings: No rash.     Comments: Skin thickness over scalp area near right ear, s/p cyst excision  Neurological:     General: No focal deficit present.     Mental Status: He is alert and oriented to person, place, and time.  Psychiatric:        Mood and Affect: Mood normal.        Behavior: Behavior normal.     BP 131/86 (BP Location: Left Arm, Patient Position: Sitting, Cuff Size: Large)   Pulse 66   Ht 6' (1.829 m)   Wt 236 lb (107 kg)   SpO2 95%   BMI 32.01 kg/m  Wt Readings from Last 3 Encounters:  02/07/22 236 lb (107 kg)  01/23/22 231 lb (104.8 kg)  01/20/22 225 lb (102.1 kg)    Lab Results  Component Value Date   TSH 1.63 03/13/2011   Lab Results  Component Value Date   WBC 11.7 (H) 01/23/2022   HGB 16.0 01/23/2022   HCT 45.8 01/23/2022   MCV 90 01/23/2022   PLT 370 01/23/2022   Lab Results  Component Value Date   NA 139 01/23/2022   K 4.3 01/23/2022   CO2 21 01/23/2022   GLUCOSE 145 (H) 01/23/2022   BUN 13 01/23/2022   CREATININE 0.91 01/23/2022   BILITOT 0.2 01/23/2022   ALKPHOS 113  01/23/2022   AST 34 01/23/2022   ALT 56 (H) 01/23/2022   PROT 6.9 01/23/2022   ALBUMIN 4.4 01/23/2022   CALCIUM 9.5 01/23/2022   ANIONGAP 8 04/28/2021   EGFR 103 01/23/2022   GFR 86.11 03/13/2011   Lab Results  Component Value Date   CHOL 159 01/23/2022   Lab Results  Component Value Date   HDL 38 (L) 01/23/2022   Lab Results  Component Value Date   LDLCALC 79 01/23/2022   Lab Results  Component Value Date   TRIG 252 (H) 01/23/2022   Lab Results  Component Value Date   CHOLHDL 4.2 01/23/2022   Lab Results  Component Value Date   HGBA1C 5.8 (H) 01/23/2022      Assessment & Plan:   Problem List Items Addressed This Visit       Cardiovascular and Mediastinum   Hypertension    BP Readings from Last 1 Encounters:  02/07/22 131/86  Well-controlled Counseled for compliance with the medications Advised DASH diet and moderate exercise/walking, at least 150 mins/week      Relevant Medications   telmisartan (MICARDIS) 20 MG tablet   Coronary artery disease of native artery of native heart with stable angina pectoris (HCC)    Nonobstructive CAD Followed by cardiology On aspirin and statin On beta-blocker Has episodes of chest pain, mostly chest soreness, likely MSK in etiology, although has nitroglycerin for as needed use      Relevant Medications   lamoTRIgine (LAMICTAL) 200 MG tablet   telmisartan (MICARDIS) 20 MG tablet     Respiratory   Rhinosinusitis (Chronic)    His sinus symptoms are likely due to allergies Continue Azelastine-fluticasone nasal spray for allergies Albuterol as needed  for dyspnea/wheezing, could be related to bronchitis        Other   History of PSVT (paroxysmal supraventricular tachycardia)    S/p cardiac ablation in 2006 On Metoprolol 25 mg BID      Mixed hyperlipidemia    Lipid profile reviewed On Lipitor 40 mg daily now, did not have angioedema like reaction this time DC Zetia for now Advised to take fish oil as well       Relevant Medications   telmisartan (MICARDIS) 20 MG tablet   Neuralgia involving scalp    Right periauricular area since cyst resection On Lamictal, well-controlled currently      Relevant Medications   lamoTRIgine (LAMICTAL) 200 MG tablet   Tobacco abuse    Smokes about 2 cigarettes/day, has 35-pack year smoking history  Asked about quitting: confirms that he/she currently smokes cigarettes Advise to quit smoking: Educated about QUITTING to reduce the risk of cancer, cardio and cerebrovascular disease. Assess willingness: Unwilling to quit at this time, but is working on cutting back. Assist with counseling and pharmacotherapy: Counseled for 5 minutes and literature provided. Arrange for follow up: follow up in 3 months and continue to offer help.      Refused influenza vaccine   Encounter for general adult medical examination with abnormal findings - Primary    Physical exam as documented. Fasting blood tests reviewed. Prefers to wait for colonoscopy and Shingrix vaccine till he gets new insurance.      Proteinuria    Slightly higher urine microalbumin, although it was checked when he had URTI/acute bronchitis On ARB       Meds ordered this encounter  Medications   lamoTRIgine (LAMICTAL) 200 MG tablet    Sig: Take 1 tablet (200 mg total) by mouth daily.    Dispense:  90 tablet    Refill:  1   telmisartan (MICARDIS) 20 MG tablet    Sig: Take 1 tablet (20 mg total) by mouth daily.    Dispense:  90 tablet    Refill:  1    Follow-up: Return in about 6 months (around 08/08/2022) for HTN and chronic sinusitis.    [name], MD

## 2022-05-19 ENCOUNTER — Other Ambulatory Visit: Payer: Self-pay | Admitting: Cardiology

## 2022-05-20 ENCOUNTER — Other Ambulatory Visit: Payer: Self-pay | Admitting: Internal Medicine

## 2022-05-20 DIAGNOSIS — E782 Mixed hyperlipidemia: Secondary | ICD-10-CM

## 2022-05-20 MED ORDER — ATORVASTATIN CALCIUM 40 MG PO TABS: 40.0000 mg | ORAL_TABLET | Freq: Every day | ORAL | 1 refills | Status: DC

## 2022-06-07 ENCOUNTER — Telehealth: Payer: Self-pay | Admitting: Internal Medicine

## 2022-06-07 ENCOUNTER — Other Ambulatory Visit: Payer: Self-pay

## 2022-06-07 DIAGNOSIS — M792 Neuralgia and neuritis, unspecified: Secondary | ICD-10-CM

## 2022-06-07 MED ORDER — LAMOTRIGINE 200 MG PO TABS: 200.0000 mg | ORAL_TABLET | Freq: Every day | ORAL | 0 refills | Status: DC

## 2022-06-07 NOTE — Telephone Encounter (Signed)
Patient left [place] went to the beach, currently patient [place] the beach and needs 1 pill lamoTRIgine (LAMICTAL) 200 MG tablet [[id number]]  Pharmacy: [facility] their  Fax # [phone]

## 2022-06-07 NOTE — Telephone Encounter (Signed)
Faxed rx to [facility], patient advised

## 2022-07-01 ENCOUNTER — Telehealth: Payer: Self-pay | Admitting: Internal Medicine

## 2022-07-01 ENCOUNTER — Other Ambulatory Visit: Payer: Self-pay

## 2022-07-01 DIAGNOSIS — E782 Mixed hyperlipidemia: Secondary | ICD-10-CM

## 2022-07-01 MED ORDER — ATORVASTATIN CALCIUM 40 MG PO TABS: 40.0000 mg | ORAL_TABLET | Freq: Every day | ORAL | 1 refills | Status: DC

## 2022-07-01 NOTE — Telephone Encounter (Signed)
Refills sent to pharmacy, patient advised. 

## 2022-07-01 NOTE — Telephone Encounter (Signed)
Patient is needing refill on Cholesterol medication .  Patient did not have medication bottle on hand.  Wants a call back when med is sent in to [facility].

## 2022-07-18 ENCOUNTER — Telehealth: Payer: Self-pay | Admitting: Internal Medicine

## 2022-07-18 NOTE — Telephone Encounter (Signed)
Patient returned call he missed about 2 hours ago; unsure of who called him.

## 2022-08-09 ENCOUNTER — Other Ambulatory Visit: Payer: Self-pay | Admitting: Internal Medicine

## 2022-08-09 ENCOUNTER — Encounter: Payer: Self-pay | Admitting: Internal Medicine

## 2022-08-09 ENCOUNTER — Ambulatory Visit (INDEPENDENT_AMBULATORY_CARE_PROVIDER_SITE_OTHER): Payer: Commercial Managed Care - PPO | Admitting: Internal Medicine

## 2022-08-09 VITALS — BP 136/88 | HR 63 | Ht 72.0 in | Wt 235.4 lb

## 2022-08-09 DIAGNOSIS — Z8679 Personal history of other diseases of the circulatory system: Secondary | ICD-10-CM

## 2022-08-09 DIAGNOSIS — M792 Neuralgia and neuritis, unspecified: Secondary | ICD-10-CM

## 2022-08-09 DIAGNOSIS — I25118 Atherosclerotic heart disease of native coronary artery with other forms of angina pectoris: Secondary | ICD-10-CM

## 2022-08-09 DIAGNOSIS — I10 Essential (primary) hypertension: Secondary | ICD-10-CM | POA: Diagnosis not present

## 2022-08-09 DIAGNOSIS — J329 Chronic sinusitis, unspecified: Secondary | ICD-10-CM

## 2022-08-09 DIAGNOSIS — E782 Mixed hyperlipidemia: Secondary | ICD-10-CM | POA: Diagnosis not present

## 2022-08-09 NOTE — Patient Instructions (Signed)
Please continue to take medications as prescribed. ? ?Please continue to follow low carb diet and perform moderate exercise/walking at least 150 mins/week. ?

## 2022-08-09 NOTE — Assessment & Plan Note (Addendum)
Right periauricular area since cyst resection On Lamictal, well-controlled currently Check Lamictal level

## 2022-08-09 NOTE — Assessment & Plan Note (Signed)
Nonobstructive CAD Followed by cardiology On aspirin and statin On beta-blocker Has nitroglycerin for as needed use

## 2022-08-09 NOTE — Progress Notes (Signed)
Established Patient Office Visit  Subjective:  Patient ID: [name], male    DOB: Aug 29, 1970  Age: 52 y.o. MRN: [id number]  CC:  Chief Complaint  Patient presents with   Hypertension    Six month follow up     HPI [name] is a 52 y.o. male with past medical history of PSVT s/p cardiac ablation, HTN, scalp cyst s/p excision, scalp neuralgia, GERD and HLD who presents for f/u of his chronic medical conditions.  He complains of nasal congestion, sinus pressure related headache and postnasal drip, which are chronic.  He has been treated with antibiotic and steroids, but has recurrent sinus issues. Denies any recent worsening. He denies any fever, chills, dyspnea or wheezing currently.  He has tried using Flonase with some relief.  He has history of PSVT s/p cardiac ablation.  He denies any chest pain, dyspnea or palpitations currently.  He is on metoprolol for PSVT and HTN.  He has history of a scalp cyst, which was excised by his dermatologist in the past.  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  Denies any dizziness, ear pain or ear discharge currently.  He has been taking Lamictal for scalp neuralgia since then.        Past Medical History:  Diagnosis Date   Anxiety and depression    2009-suicidal ideation, involuntary commitment   Congestive heart failure (HCC) ?   Coronary artery disease    Gastroesophageal reflux disease    History of PSVT (paroxysmal supraventricular tachycardia)    s/p ablation 2006; occasional palpitations; occasional atypical left chest pain; event recorder in 03/2011-normal sinus, sinus tachycardia, single symptomatic spell with sinus rhythm   Hyperlipidemia    Intolerant to statins causing tongue swelling   Hypertension    Lab 02/2011: Normal CBC and BMet, TSH-1.6   Obstructive sleep apnea    Wears CPAP intermittantly. Followed by Dr. Gildardo Cranker Pulmonology.    Past Surgical History:  Procedure Laterality  Date   ANKLE SURGERY     Right   LEFT HEART CATHETERIZATION WITH CORONARY ANGIOGRAM N/A 02/21/2011   Procedure: LEFT HEART CATHETERIZATION WITH CORONARY ANGIOGRAM;  Surgeon: Tonny Bollman, MD;  Location: [facility] CATH LAB;  Service: Cardiovascular;  Laterality: N/A;   NASAL TURBINATE REDUCTION     SHOULDER SURGERY     Right    Family History  Problem Relation Age of Onset   Heart disease Father    Heart attack Father 28   Heart disease Paternal Grandfather     Social History   Socioeconomic History   Marital status: Single    Spouse name: Not on file   Number of children: Not on file   Years of education: Not on file   Highest education level: Not on file  Occupational History   Occupation: Construction-grading  Tobacco Use   Smoking status: Every Day    Current packs/day: 1.00    Average packs/day: 1 pack/day for 35.1 years (35.1 ttl pk-yrs)    Types: Cigarettes    Start date: 06/30/1987   Smokeless tobacco: Never  Vaping Use   Vaping status: Never Used  Substance and Sexual Activity   Alcohol use: No    Alcohol/week: 0.0 standard drinks of alcohol   Drug use: No   Sexual activity: Not on file  Other Topics Concern   Not on file  Social History Narrative   Lives with ex girlfriend.  Grown daughters.  7 grands.  Social Determinants of Health   Financial Resource Strain: Not on file  Food Insecurity: Not on file  Transportation Needs: Not on file  Physical Activity: Not on file  Stress: Not on file  Social Connections: Unknown (06/01/2021)   Received from [facility]   Social Network    Social Network: Not on file  Intimate Partner Violence: Unknown (04/24/2021)   Received from [facility]   HITS    Physically Hurt: Not on file    Insult or Talk Down To: Not on file    Threaten Physical Harm: Not on file    Scream or Curse: Not on file    Outpatient Medications Prior to Visit  Medication Sig Dispense Refill   albuterol (VENTOLIN HFA) 108 (90 Base)  MCG/ACT inhaler Inhale 2 puffs into the lungs every 6 (six) hours as needed for wheezing or shortness of breath. 8 g 2   aspirin EC 81 MG tablet Take 1 tablet (81 mg total) by mouth daily. Swallow whole. 90 tablet 3   atorvastatin (LIPITOR) 40 MG tablet Take 1 tablet (40 mg total) by mouth daily. 90 tablet 1   esomeprazole (NEXIUM) 40 MG capsule Take 40 mg by mouth daily before breakfast.       ibuprofen (ADVIL,MOTRIN) 200 MG tablet Take 800 mg by mouth every 6 (six) hours as needed (for pain).      lamoTRIgine (LAMICTAL) 200 MG tablet Take 1 tablet by mouth once daily 90 tablet 0   metoprolol tartrate (LOPRESSOR) 25 MG tablet Take 1 tablet (25 mg total) by mouth 2 (two) times daily. 180 tablet 3   nitroGLYCERIN (NITROSTAT) 0.4 MG SL tablet Place 1 tablet (0.4 mg total) under the tongue every 5 (five) minutes as needed for chest pain. 25 tablet 3   telmisartan (MICARDIS) 20 MG tablet Take 1 tablet (20 mg total) by mouth daily. 90 tablet 1   Azelastine-Fluticasone 137-50 MCG/ACT SUSP Place 1 spray into the nose every 12 (twelve) hours. 23 g 0   No facility-administered medications prior to visit.    Allergies  Allergen Reactions   Statins Other (See Comments)    Causes flushing   Simvastatin Other (See Comments)    Caused flushing    ROS Review of Systems  Constitutional:  Negative for chills and fever.  HENT:  Positive for congestion, postnasal drip and sinus pressure. Negative for sore throat.   Eyes:  Negative for pain and discharge.  Respiratory:  Negative for cough and shortness of breath.   Cardiovascular:  Negative for chest pain and palpitations.  Gastrointestinal:  Negative for constipation, diarrhea, nausea and vomiting.  Endocrine: Negative for polydipsia and polyuria.  Genitourinary:  Negative for dysuria and hematuria.  Musculoskeletal:  Negative for neck pain and neck stiffness.  Skin:  Negative for rash.  Neurological:  Positive for headaches (Around right ear/scalp  area). Negative for dizziness, weakness and numbness.  Psychiatric/Behavioral:  Negative for agitation and behavioral problems.       Objective:    Physical Exam Vitals reviewed.  Constitutional:      General: He is not in acute distress.    Appearance: He is not diaphoretic.  HENT:     Head: Normocephalic and atraumatic.     Nose: Congestion present.     Mouth/Throat:     Mouth: Mucous membranes are moist.  Eyes:     General: No scleral icterus.    Extraocular Movements: Extraocular movements intact.  Cardiovascular:     Rate and  Rhythm: Normal rate and regular rhythm.     Pulses: Normal pulses.     Heart sounds: Normal heart sounds. No murmur heard. Pulmonary:     Breath sounds: Normal breath sounds. No wheezing or rales.  Musculoskeletal:     Cervical back: Neck supple. No tenderness.     Right lower leg: No edema.     Left lower leg: No edema.  Skin:    General: Skin is warm.     Findings: No rash.     Comments: Skin thickness over scalp area near right ear, s/p cyst excision  Neurological:     General: No focal deficit present.     Mental Status: He is alert and oriented to person, place, and time.  Psychiatric:        Mood and Affect: Mood normal.        Behavior: Behavior normal.     BP 136/88 (BP Location: Right Arm, Patient Position: Sitting, Cuff Size: Large)   Pulse 63   Ht 6' (1.829 m)   Wt 235 lb 6.4 oz (106.8 kg)   SpO2 94%   BMI 31.93 kg/m  Wt Readings from Last 3 Encounters:  08/09/22 235 lb 6.4 oz (106.8 kg)  02/07/22 236 lb (107 kg)  01/23/22 231 lb (104.8 kg)    Lab Results  Component Value Date   TSH 1.63 03/13/2011   Lab Results  Component Value Date   WBC 11.7 (H) 01/23/2022   HGB 16.0 01/23/2022   HCT 45.8 01/23/2022   MCV 90 01/23/2022   PLT 370 01/23/2022   Lab Results  Component Value Date   NA 139 01/23/2022   K 4.3 01/23/2022   CO2 21 01/23/2022   GLUCOSE 145 (H) 01/23/2022   BUN 13 01/23/2022   CREATININE 0.91  01/23/2022   BILITOT 0.2 01/23/2022   ALKPHOS 113 01/23/2022   AST 34 01/23/2022   ALT 56 (H) 01/23/2022   PROT 6.9 01/23/2022   ALBUMIN 4.4 01/23/2022   CALCIUM 9.5 01/23/2022   ANIONGAP 8 04/28/2021   EGFR 103 01/23/2022   GFR 86.11 03/13/2011   Lab Results  Component Value Date   CHOL 159 01/23/2022   Lab Results  Component Value Date   HDL 38 (L) 01/23/2022   Lab Results  Component Value Date   LDLCALC 79 01/23/2022   Lab Results  Component Value Date   TRIG 252 (H) 01/23/2022   Lab Results  Component Value Date   CHOLHDL 4.2 01/23/2022   Lab Results  Component Value Date   HGBA1C 5.8 (H) 01/23/2022      Assessment & Plan:   Problem List Items Addressed This Visit       Cardiovascular and Mediastinum   Hypertension - Primary    BP Readings from Last 1 Encounters:  04/25/21 118/74  Well-controlled Counseled for compliance with the medications Advised DASH diet and moderate exercise/walking, at least 150 mins/week      Relevant Medications   ezetimibe (ZETIA) 10 MG tablet     Respiratory   Obstructive sleep apnea    His dyspnea/apnea episodes could be due to OSA, advised to start using CPAP again If persistent symptoms, will refer to pulmonology      Allergic sinusitis    His sinus symptoms are likely due to allergies Started Xyzal for allergies Continue Flonase for allergies Albuterol as needed for dyspnea/wheezing, could be related to bronchitis      Relevant Medications   levocetirizine (XYZAL) 5 MG  tablet     Other   History of PSVT (paroxysmal supraventricular tachycardia)    S/p cardiac ablation in 2006 On Metoprolol 25 mg BID      Hyperlipidemia    Lipid profile reviewed He does not tolerate statin, had angioedema like reaction Started Zetia for now Advised to take fish oil as well      Relevant Medications   ezetimibe (ZETIA) 10 MG tablet   Other Relevant Orders   Lipid panel   Neuralgia involving scalp    Right  periauricular area since cyst resection On Lamictal, well-controlled currently      Other Visit Diagnoses     Special screening for malignant neoplasms, colon       Relevant Orders   Ambulatory referral to Gastroenterology   Acute bronchitis, unspecified organism       Relevant Medications   albuterol (VENTOLIN HFA) 108 (90 Base) MCG/ACT inhaler   No orders of the defined types were placed in this encounter.   Follow-up: No follow-ups on file.    Anabel Halon, MD

## 2022-08-09 NOTE — Assessment & Plan Note (Signed)
Lipid profile reviewed On Lipitor 40 mg daily now, did not have angioedema like reaction with it DC Zetia for now Advised to take fish oil as well

## 2022-08-09 NOTE — Assessment & Plan Note (Signed)
His sinus symptoms are likely due to allergies Continue Flonase nasal spray for allergies Albuterol as needed for dyspnea/wheezing, could be related to chronic bronchitis

## 2022-08-09 NOTE — Assessment & Plan Note (Signed)
S/p cardiac ablation in 2006 On Metoprolol 25 mg BID 

## 2022-08-09 NOTE — Assessment & Plan Note (Addendum)
BP Readings from Last 1 Encounters:  08/09/22 136/88   Well-controlled with Metoprolol, but compliance to Telmisartan questionable Counseled for compliance with the medications Advised DASH diet and moderate exercise/walking, at least 150 mins/week

## 2022-08-11 LAB — CBC WITH DIFFERENTIAL/PLATELET
Basophils Absolute: 0 10*3/uL (ref 0.0–0.2)
Basos: 1 %
EOS (ABSOLUTE): 0.2 10*3/uL (ref 0.0–0.4)
Eos: 3 %
Hematocrit: 47.6 % (ref 37.5–51.0)
Hemoglobin: 16.1 g/dL (ref 13.0–17.7)
Immature Grans (Abs): 0 10*3/uL (ref 0.0–0.1)
Immature Granulocytes: 0 %
Lymphocytes Absolute: 2.4 10*3/uL (ref 0.7–3.1)
Lymphs: 31 %
MCH: 30.8 pg (ref 26.6–33.0)
MCHC: 33.8 g/dL (ref 31.5–35.7)
MCV: 91 fL (ref 79–97)
Monocytes Absolute: 0.8 10*3/uL (ref 0.1–0.9)
Monocytes: 10 %
Neutrophils Absolute: 4.4 10*3/uL (ref 1.4–7.0)
Neutrophils: 55 %
Platelets: 264 10*3/uL (ref 150–450)
RBC: 5.23 x10E6/uL (ref 4.14–5.80)
RDW: 13.6 % (ref 11.6–15.4)
WBC: 7.9 10*3/uL (ref 3.4–10.8)

## 2022-08-11 LAB — CMP14+EGFR
ALT: 28 [IU]/L (ref 0–44)
AST: 19 [IU]/L (ref 0–40)
Albumin: 4.6 g/dL (ref 3.8–4.9)
Alkaline Phosphatase: 121 [IU]/L (ref 44–121)
BUN/Creatinine Ratio: 10 (ref 9–20)
BUN: 11 mg/dL (ref 6–24)
Bilirubin Total: 0.3 mg/dL (ref 0.0–1.2)
CO2: 21 mmol/L (ref 20–29)
Calcium: 9.7 mg/dL (ref 8.7–10.2)
Chloride: 106 mmol/L (ref 96–106)
Creatinine, Ser: 1.08 mg/dL (ref 0.76–1.27)
Globulin, Total: 2.3 g/dL (ref 1.5–4.5)
Glucose: 114 mg/dL — ABNORMAL HIGH (ref 70–99)
Potassium: 4.3 mmol/L (ref 3.5–5.2)
Sodium: 143 mmol/L (ref 134–144)
Total Protein: 6.9 g/dL (ref 6.0–8.5)
eGFR: 83 mL/min/{1.73_m2}

## 2022-08-11 LAB — LAMOTRIGINE LEVEL: Lamotrigine Lvl: 2.8 ug/mL (ref 2.0–20.0)

## 2022-09-17 ENCOUNTER — Emergency Department (HOSPITAL_COMMUNITY): Payer: Commercial Managed Care - PPO

## 2022-09-17 ENCOUNTER — Encounter (HOSPITAL_COMMUNITY): Payer: Self-pay | Admitting: Emergency Medicine

## 2022-09-17 ENCOUNTER — Emergency Department (HOSPITAL_COMMUNITY)
Admission: EM | Admit: 2022-09-17 | Discharge: 2022-09-18 | Disposition: A | Discharge status: Home / Self Care | Payer: Commercial Managed Care - PPO | Attending: Emergency Medicine | Admitting: Emergency Medicine

## 2022-09-17 DIAGNOSIS — H538 Other visual disturbances: Secondary | ICD-10-CM | POA: Insufficient documentation

## 2022-09-17 DIAGNOSIS — Z7982 Long term (current) use of aspirin: Secondary | ICD-10-CM | POA: Diagnosis not present

## 2022-09-17 DIAGNOSIS — Z79899 Other long term (current) drug therapy: Secondary | ICD-10-CM | POA: Diagnosis not present

## 2022-09-17 DIAGNOSIS — R42 Dizziness and giddiness: Secondary | ICD-10-CM | POA: Diagnosis present

## 2022-09-17 DIAGNOSIS — H539 Unspecified visual disturbance: Secondary | ICD-10-CM

## 2022-09-17 LAB — BASIC METABOLIC PANEL
Anion gap: 8 (ref 5–15)
BUN: 12 mg/dL (ref 6–20)
CO2: 24 mmol/L (ref 22–32)
Calcium: 8.7 mg/dL — ABNORMAL LOW (ref 8.9–10.3)
Chloride: 105 mmol/L (ref 98–111)
Creatinine, Ser: 1.14 mg/dL (ref 0.61–1.24)
GFR, Estimated: >60 mL/min (ref >60)
Glucose, Bld: 136 mg/dL — ABNORMAL HIGH (ref 70–99)
Potassium: 3.1 mmol/L — ABNORMAL LOW (ref 3.5–5.1)
Sodium: 137 mmol/L (ref 135–145)

## 2022-09-17 LAB — CBC
HCT: 44.5 % (ref 39.0–52.0)
Hemoglobin: 15.5 g/dL (ref 13.0–17.0)
MCH: 31.4 pg (ref 26.0–34.0)
MCHC: 34.8 g/dL (ref 30.0–36.0)
MCV: 90.3 fL (ref 80.0–100.0)
Platelets: 248 10*3/uL (ref 150–400)
RBC: 4.93 MIL/uL (ref 4.22–5.81)
RDW: 13.4 % (ref 11.5–15.5)
WBC: 9.7 10*3/uL (ref 4.0–10.5)
nRBC: 0 % (ref 0.0–0.2)

## 2022-09-17 LAB — CBG MONITORING, ED: Glucose-Capillary: 142 mg/dL — ABNORMAL HIGH (ref 70–99)

## 2022-09-17 MED ORDER — IOHEXOL 350 MG/ML SOLN
75.0000 mL | Freq: Once | INTRAVENOUS | Status: AC | PRN
  Administered 2022-09-17: 75 mL via INTRAVENOUS

## 2022-09-17 NOTE — ED Triage Notes (Signed)
Pt states for past 6 months will randomly have moments of blindness that last 2-69minutes. No known cause. States usually can feel it about to happen 30sec-1 minutes before vision changes. Today states it happened around 11am and lasted 3 minutes. And now has some dizziness and headache.

## 2022-09-18 MED ORDER — KETOROLAC TROMETHAMINE 30 MG/ML IJ SOLN
15.0000 mg | Freq: Once | INTRAMUSCULAR | Status: AC
  Administered 2022-09-18: 15 mg via INTRAVENOUS
  Filled 2022-09-18: qty 1

## 2022-09-18 MED ORDER — METOCLOPRAMIDE HCL 5 MG/ML IJ SOLN
10.0000 mg | Freq: Once | INTRAMUSCULAR | Status: AC
  Administered 2022-09-18: 10 mg via INTRAVENOUS
  Filled 2022-09-18: qty 2

## 2022-09-18 NOTE — Discharge Instructions (Signed)
As discussed, today's evaluation somewhat reassuring, but with your episodes of dizziness and vision changes it is warranted to follow-up with one of our neurology offices.  Do not hesitate to return here for concerning changes in your condition.

## 2022-09-18 NOTE — ED Provider Notes (Signed)
[place] EMERGENCY DEPARTMENT AT [facility] Provider Note   CSN: [id number] Arrival date & time: 09/17/22  1921     History  Chief Complaint  Patient presents with   Eye Problem   Dizziness    [name] is a 52 y.o. male.  HPI Patient presents with his wife who assists with the history.  He presents with concern for episodes of dizziness with vision loss.  He has had approximately 4 episodes over the past 2 years including 1 today.  No associated seizure activity, no loss of consciousness, episodes last several moments.  No obvious precipitating, alleviating or exacerbating factors.  He has no history of seizure disorder, stroke, does take antihypertensives. He notes that during a dermatologic procedure on his right temple a few years ago he was told that the cutaneous lesion involved vascular structures and it was not removed.    Home Medications Prior to Admission medications   Medication Sig Start Date End Date Taking? Authorizing Provider  albuterol (VENTOLIN HFA) 108 (90 Base) MCG/ACT inhaler Inhale 2 puffs into the lungs every 6 (six) hours as needed for wheezing or shortness of breath. 01/23/22   Del Nigel Berthold, FNP  aspirin EC 81 MG tablet Take 1 tablet (81 mg total) by mouth daily. Swallow whole. 05/10/21   Rollene Rotunda, MD  atorvastatin (LIPITOR) 40 MG tablet Take 1 tablet (40 mg total) by mouth daily. 07/01/22   Anabel Halon, MD  esomeprazole (NEXIUM) 40 MG capsule Take 40 mg by mouth daily before breakfast.      [provider]  ibuprofen (ADVIL,MOTRIN) 200 MG tablet Take 800 mg by mouth every 6 (six) hours as needed (for pain).     [provider]  lamoTRIgine (LAMICTAL) 200 MG tablet Take 1 tablet by mouth once daily 08/09/22   Anabel Halon, MD  metoprolol tartrate (LOPRESSOR) 25 MG tablet Take 1 tablet (25 mg total) by mouth 2 (two) times daily. 10/09/21   Anabel Halon, MD  nitroGLYCERIN (NITROSTAT) 0.4 MG SL tablet  Place 1 tablet (0.4 mg total) under the tongue every 5 (five) minutes as needed for chest pain. 05/10/21 08/08/21  Rollene Rotunda, MD  telmisartan (MICARDIS) 20 MG tablet Take 1 tablet (20 mg total) by mouth daily. 02/07/22   Anabel Halon, MD      Allergies    Statins and Simvastatin    Review of Systems   Review of Systems  Constitutional:        Per HPI, otherwise negative  HENT:         Per HPI, otherwise negative  Respiratory:         Per HPI, otherwise negative  Cardiovascular:        Per HPI, otherwise negative  Gastrointestinal:  Negative for vomiting.  Endocrine:       Negative aside from HPI  Genitourinary:        Neg aside from HPI   Musculoskeletal:        Per HPI, otherwise negative  Skin: Negative.   Neurological:  Negative for syncope.    Physical Exam Updated Vital Signs BP 128/86   Pulse 73   Temp 98.5 F (36.9 C)   Resp 20   Ht 6' (1.829 m)   Wt 106.8 kg   SpO2 97%   BMI 31.93 kg/m  Physical Exam Vitals and nursing note reviewed.  Constitutional:      General: He is not in acute distress.  Appearance: He is well-developed.  HENT:     Head: Normocephalic and atraumatic.  Eyes:     Conjunctiva/sclera: Conjunctivae normal.  Cardiovascular:     Rate and Rhythm: Normal rate and regular rhythm.  Pulmonary:     Effort: Pulmonary effort is normal. No respiratory distress.     Breath sounds: No stridor.  Abdominal:     General: There is no distension.  Skin:    General: Skin is warm and dry.  Neurological:     General: No focal deficit present.     Mental Status: He is alert and oriented to person, place, and time.     Motor: No weakness.     Coordination: Coordination normal.     ED Results / Procedures / Treatments   Labs (all labs ordered are listed, but only abnormal results are displayed) Labs Reviewed  BASIC METABOLIC PANEL - Abnormal; Notable for the following components:      Result Value   Potassium 3.1 (*)    Glucose, Bld  136 (*)    Calcium 8.7 (*)    All other components within normal limits  CBG MONITORING, ED - Abnormal; Notable for the following components:   Glucose-Capillary 142 (*)    All other components within normal limits  CBC  URINALYSIS, ROUTINE W REFLEX MICROSCOPIC  CBG MONITORING, ED    EKG None  Radiology CT ANGIO HEAD NECK W WO CM  Result Date: 09/17/2022 CLINICAL DATA:  Intermittent blindness EXAM: CT ANGIOGRAPHY HEAD AND NECK WITH AND WITHOUT CONTRAST TECHNIQUE: Multidetector CT imaging of the head and neck was performed using the standard protocol during bolus administration of intravenous contrast. Multiplanar CT image reconstructions and MIPs were obtained to evaluate the vascular anatomy. Carotid stenosis measurements (when applicable) are obtained utilizing NASCET criteria, using the distal internal carotid diameter as the denominator. RADIATION DOSE REDUCTION: This exam was performed according to the departmental dose-optimization program which includes automated exposure control, adjustment of the mA and/or kV according to patient size and/or use of iterative reconstruction technique. CONTRAST:  75mL OMNIPAQUE IOHEXOL 350 MG/ML SOLN COMPARISON:  None Available. FINDINGS: CT HEAD FINDINGS Brain: There is no mass, hemorrhage or extra-axial collection. The size and configuration of the ventricles and extra-axial CSF spaces are normal. There is no acute or chronic infarction. The brain parenchyma is normal. Skull: The visualized skull base, calvarium and extracranial soft tissues are normal. Sinuses/Orbits: No fluid levels or advanced mucosal thickening of the visualized paranasal sinuses. No mastoid or middle ear effusion. The orbits are normal. CTA NECK FINDINGS SKELETON: There is no bony spinal canal stenosis. No lytic or blastic lesion. OTHER NECK: Normal pharynx, larynx and major salivary glands. No cervical lymphadenopathy. Unremarkable thyroid gland. UPPER CHEST: No pneumothorax or  pleural effusion. No nodules or masses. AORTIC ARCH: There is no calcific atherosclerosis of the aortic arch. Conventional 3 vessel aortic branching pattern. RIGHT CAROTID SYSTEM: Normal without aneurysm, dissection or stenosis. LEFT CAROTID SYSTEM: Normal without aneurysm, dissection or stenosis. VERTEBRAL ARTERIES: Right dominant configuration.There is no dissection, occlusion or flow-limiting stenosis to the skull base (V1-V3 segments). CTA HEAD FINDINGS POSTERIOR CIRCULATION: --Vertebral arteries: Normal V4 segments. --Inferior cerebellar arteries: Normal. --Basilar artery: Normal. --Superior cerebellar arteries: Normal. --Posterior cerebral arteries (PCA): Normal. ANTERIOR CIRCULATION: --Intracranial internal carotid arteries: Atherosclerotic calcification of the internal carotid arteries at the skull base without hemodynamically significant stenosis. --Anterior cerebral arteries (ACA): Normal. Both A1 segments are present. Patent anterior communicating artery (a-comm). --Middle cerebral arteries (MCA): Normal. VENOUS  SINUSES: As permitted by contrast timing, patent. ANATOMIC VARIANTS: Fetal origin of the left posterior cerebral artery. Review of the MIP images confirms the above findings. IMPRESSION: No emergent large vessel occlusion or hemodynamically significant stenosis of the head or neck. Electronically Signed   By: Deatra Robinson M.D.   On: 09/17/2022 23:34    Procedures Procedures    Medications Ordered in ED Medications  metoCLOPramide (REGLAN) injection 10 mg (has no administration in time range)  ketorolac (TORADOL) 30 MG/ML injection 15 mg (has no administration in time range)  iohexol (OMNIPAQUE) 350 MG/ML injection 75 mL (75 mLs Intravenous Contrast Given 09/17/22 2253)    ED Course/ Medical Decision Making/ A&P                                 Medical Decision Making Adult male presents with 4 episodes of similar characteristically abnormal periods over the past 2 years.  The  description of a prodromal period with dizziness and vision change suggests complex migraine, though other consideration including seizure, vascular abnormality, TIA.  Stroke less likely as the patient returns to baseline. Cardiac 70 sinus normal Pulse ox 100% room air normal Head CT, labs ordered.  Amount and/or Complexity of Data Reviewed Independent Historian: spouse Labs: ordered. Decision-making details documented in ED Course. Radiology: ordered and independent interpretation performed. Decision-making details documented in ED Course.  Risk Prescription drug management. Decision regarding hospitalization.   12:07 AM On repeat exam patient has mild headache, otherwise no complaints.  He, his wife and I again discussed all findings, possibilities for his episodes including those listed above.  Here no evidence for vascular occlusion, mass, hemorrhage, stroke.  Patient appropriate for close outpatient follow-up with our neurology colleagues.        Final Clinical Impression(s) / ED Diagnoses Final diagnoses:  Dizziness  Vision changes    Rx / DC Orders ED Discharge Orders     None         Gerhard Munch, MD 09/18/22 [phone]

## 2022-10-07 ENCOUNTER — Other Ambulatory Visit: Payer: Self-pay

## 2022-10-07 DIAGNOSIS — I10 Essential (primary) hypertension: Secondary | ICD-10-CM

## 2022-10-07 DIAGNOSIS — Z8679 Personal history of other diseases of the circulatory system: Secondary | ICD-10-CM

## 2022-10-07 MED ORDER — METOPROLOL TARTRATE 25 MG PO TABS: 25.0000 mg | ORAL_TABLET | Freq: Two times a day (BID) | ORAL | 3 refills | Status: DC

## 2022-10-28 ENCOUNTER — Emergency Department (HOSPITAL_COMMUNITY): Payer: Commercial Managed Care - PPO

## 2022-10-28 ENCOUNTER — Encounter (HOSPITAL_COMMUNITY): Payer: Self-pay

## 2022-10-28 ENCOUNTER — Emergency Department (HOSPITAL_COMMUNITY)
Admission: EM | Admit: 2022-10-28 | Discharge: 2022-10-28 | Disposition: A | Discharge status: Home / Self Care | Payer: Commercial Managed Care - PPO | Attending: Emergency Medicine | Admitting: Emergency Medicine

## 2022-10-28 DIAGNOSIS — Z79899 Other long term (current) drug therapy: Secondary | ICD-10-CM | POA: Diagnosis not present

## 2022-10-28 DIAGNOSIS — I2510 Atherosclerotic heart disease of native coronary artery without angina pectoris: Secondary | ICD-10-CM | POA: Diagnosis not present

## 2022-10-28 DIAGNOSIS — I10 Essential (primary) hypertension: Secondary | ICD-10-CM | POA: Insufficient documentation

## 2022-10-28 DIAGNOSIS — Z7982 Long term (current) use of aspirin: Secondary | ICD-10-CM | POA: Insufficient documentation

## 2022-10-28 DIAGNOSIS — R079 Chest pain, unspecified: Secondary | ICD-10-CM | POA: Diagnosis present

## 2022-10-28 LAB — CBC
HCT: 41.5 % (ref 39.0–52.0)
Hemoglobin: 14.2 g/dL (ref 13.0–17.0)
MCH: 31.1 pg (ref 26.0–34.0)
MCHC: 34.2 g/dL (ref 30.0–36.0)
MCV: 91 fL (ref 80.0–100.0)
Platelets: 231 10*3/uL (ref 150–400)
RBC: 4.56 MIL/uL (ref 4.22–5.81)
RDW: 12.9 % (ref 11.5–15.5)
WBC: 9.5 10*3/uL (ref 4.0–10.5)
nRBC: 0 % (ref 0.0–0.2)

## 2022-10-28 LAB — BASIC METABOLIC PANEL
Anion gap: 13 (ref 5–15)
BUN: 7 mg/dL (ref 6–20)
CO2: 23 mmol/L (ref 22–32)
Calcium: 8.9 mg/dL (ref 8.9–10.3)
Chloride: 103 mmol/L (ref 98–111)
Creatinine, Ser: 1.11 mg/dL (ref 0.61–1.24)
GFR, Estimated: >60 mL/min (ref >60)
Glucose, Bld: 91 mg/dL (ref 70–99)
Potassium: 3.6 mmol/L (ref 3.5–5.1)
Sodium: 139 mmol/L (ref 135–145)

## 2022-10-28 LAB — TROPONIN I (HIGH SENSITIVITY)
Troponin I (High Sensitivity): 7 ng/L (ref <18)
Troponin I (High Sensitivity): 7 ng/L (ref <18)

## 2022-10-28 NOTE — ED Triage Notes (Signed)
Pt arrives via EMS from his work place. Pt reports onset of chest pressure that started around 1700. EMS administered 2 SL nitroglycerin and 324mg  of aspirin. Pt did experience relief after the nitroglycerin.

## 2022-10-28 NOTE — ED Provider Notes (Signed)
[facility] Provider Note   CSN: [id number] Arrival date & time: 10/28/22  1746     History  Chief Complaint  Patient presents with   Chest Pain    [name] is a 52 y.o. male.   Chest Pain 52 year old male with past medical history of CAD, history of PSVT, hypertension, hyperlipidemia, OSA, GERD presenting for evaluation of chest pain.  Patient states that this afternoon he was sitting at work when he had sudden onset of central chest pressure with numbness and tingling of the left arm.  EMS services were called and did administer aspirin as well as nitroglycerin.  Following this interventions, patient symptoms completely resolved.  He says that they lasted approximately 1 hour in total.  Denies any fevers, cough, congestion, abdominal pain, pain with urination, urinary frequency.  He does note having some mild dizziness yesterday, but attributes this to sinus issues with dust as he was at a [employer] event the night prior.  At present, patient is completely asymptomatic and says that he feels well.     Home Medications Prior to Admission medications   Medication Sig Start Date End Date Taking? Authorizing Provider  albuterol (VENTOLIN HFA) 108 (90 Base) MCG/ACT inhaler Inhale 2 puffs into the lungs every 6 (six) hours as needed for wheezing or shortness of breath. 01/23/22   Del Nigel Berthold, FNP  aspirin EC 81 MG tablet Take 1 tablet (81 mg total) by mouth daily. Swallow whole. 05/10/21   Rollene Rotunda, MD  atorvastatin (LIPITOR) 40 MG tablet Take 1 tablet (40 mg total) by mouth daily. 07/01/22   Anabel Halon, MD  esomeprazole (NEXIUM) 40 MG capsule Take 40 mg by mouth daily before breakfast.      [provider]  ibuprofen (ADVIL,MOTRIN) 200 MG tablet Take 800 mg by mouth every 6 (six) hours as needed (for pain).     [provider]  lamoTRIgine (LAMICTAL) 200 MG tablet Take 1 tablet by mouth once  daily 08/09/22   Anabel Halon, MD  metoprolol tartrate (LOPRESSOR) 25 MG tablet Take 1 tablet (25 mg total) by mouth 2 (two) times daily. 10/07/22   Anabel Halon, MD  nitroGLYCERIN (NITROSTAT) 0.4 MG SL tablet Place 1 tablet (0.4 mg total) under the tongue every 5 (five) minutes as needed for chest pain. 05/10/21 08/08/21  Rollene Rotunda, MD  telmisartan (MICARDIS) 20 MG tablet Take 1 tablet (20 mg total) by mouth daily. 02/07/22   Anabel Halon, MD      Allergies    Statins and Simvastatin    Review of Systems   Review of Systems  Cardiovascular:  Positive for chest pain.    Physical Exam Updated Vital Signs BP (!) 156/100   Pulse 64   Temp 98.2 F (36.8 C) (Oral)   Resp 17   SpO2 98%  Physical Exam Constitutional:      General: He is not in acute distress.    Appearance: He is not ill-appearing.  HENT:     Head: Normocephalic and atraumatic.  Eyes:     Pupils: Pupils are equal, round, and reactive to light.  Cardiovascular:     Rate and Rhythm: Normal rate and regular rhythm.     Pulses:          Radial pulses are 2+ on the right side and 2+ on the left side.     Heart sounds: Normal heart sounds.  Pulmonary:  Effort: Pulmonary effort is normal.     Breath sounds: No decreased breath sounds or wheezing.  Chest:     Chest wall: No mass or tenderness.  Musculoskeletal:        General: Normal range of motion.     Right lower leg: No tenderness. No edema.     Left lower leg: No tenderness. No edema.  Skin:    General: Skin is warm and dry.     Findings: No rash.  Neurological:     General: No focal deficit present.     Mental Status: He is alert.     Motor: No weakness.     ED Results / Procedures / Treatments   Labs (all labs ordered are listed, but only abnormal results are displayed) Labs Reviewed  BASIC METABOLIC PANEL  CBC  TROPONIN I (HIGH SENSITIVITY)  TROPONIN I (HIGH SENSITIVITY)    EKG EKG Interpretation Date/Time:  Monday October 28 2022 22:00:36 EDT Ventricular Rate:  57 PR Interval:  162 QRS Duration:  125 QT Interval:  463 QTC Calculation: 451 R Axis:   0  Text Interpretation: Sinus rhythm Nonspecific intraventricular conduction delay Artifact Abnormal ECG Confirmed by Gerhard Munch [phone]) on 10/28/2022 10:03:58 PM  Radiology DG Chest 2 View  Result Date: 10/28/2022 CLINICAL DATA:  Chest pain short of breath EXAM: CHEST - 2 VIEW COMPARISON:  01/20/2022 FINDINGS: The heart size and mediastinal contours are within normal limits. Both lungs are clear. Old left-sided rib fractures. IMPRESSION: No active cardiopulmonary disease. Electronically Signed   By: Jasmine Pang M.D.   On: 10/28/2022 19:39    Procedures Procedures    Medications Ordered in ED Medications - No data to display  ED Course/ Medical Decision Making/ A&P      Anner Crete' Criteria for Pulmonary Embolism from [email or web address]  on 10/28/2022 ** All calculations should be rechecked by clinician prior to use **  RESULT SUMMARY: 0.0 points Low risk group: 1.3% chance of PE in an ED population.   Another study assigned scores <= 4 as "PE Unlikely" and had a 3% incidence of PE.   INPUTS: Clinical signs and symptoms of DVT --> 0 = No PE is #1 diagnosis OR equally likely --> 0 = No Heart rate > 100 --> 0 = No Immobilization at least 3 days OR surgery in the previous 4 weeks --> 0 = No Previous, objectively diagnosed PE or DVT --> 0 = No Hemoptysis --> 0 = No Malignancy w/ treatment within 6 months or palliative --> 0 = No           HEART Score: 4                    Medical Decision Making Amount and/or Complexity of Data Reviewed Labs: ordered. Radiology: ordered.   Pt is a 52 y.o. male with pertinent PMHX of CAD, history of PSVT, hypertension, hyperlipidemia, OSA, GERD who presents w/ central chest pain with radiation to the left arm.  Because of the age and risk factors of the patient, ACS will be ruled out with troponin x 2.  Aspirin and  nitroglycerin were provided by EMS with complete resolution of symptoms.  Chest XR and EKG performed. EKG: Normal sinus rhythm without concerning ST segment elevations or depressions.  No ischemic T wave inversion seen.  Low suspicion for acute ischemia.  EKG reviewed by myself and the attending.   Chest x-ray shows clear lung fields bilaterally without focal consolidation.  No  evidence of pneumothorax.  Initial troponin 7, repeat 7  HEAR score calculation: 4   Unlikely PNA as CXR without focal airspace disease, no leukocytosis, no cough, no fever. Unlikely PE as atypical presentation, low risk per [place]. Presentation not consistent with Aortic Dissection, Pancreatitis, Arrhythmia, Pneumothorax, Endo/Myo/Pericarditis, Shingles, Emergent complications of an Ulcer, Esophageal pathology, or other emergent pathology. Other differential considered includes: GERD, Gastritis, Trauma, Viral Infection, MSK Pain, Costochondritis, Chest wall pain, Myalgia, COPD, CHF, Biliary Disease  Patient reassured by above workup.  We did discuss the need to follow-up with both primary care and cardiology as an outpatient.  We addressed his blood pressure, which in the emergency department was in the 140-150 systolic range.  He reports that with the EMS his blood pressure was much higher.  He is understanding that is important to follow-up with primary care for blood pressure management.  I have low suspicion for hypertensive emergency at this time as he is currently asymptomatic with blood pressure of 156/100 at discharge. Strict return precautions provided.     The plan for this patient was discussed with Dr. Jeraldine Loots, who voiced agreement and who oversaw evaluation and treatment of this patient.     Final Clinical Impression(s) / ED Diagnoses Final diagnoses:  Chest pain in adult    Rx / DC Orders ED Discharge Orders          Ordered    Ambulatory referral to Cardiology        10/28/22 2202               Lyman Speller, MD 10/28/22 2209    Gerhard Munch, MD 10/28/22 2322

## 2022-10-28 NOTE — ED Provider Triage Note (Signed)
Emergency Medicine Provider Triage Evaluation Note  [name] , a 52 y.o. male  was evaluated in triage.  Pt complains of chest pain.  Review of Systems  Positive: Chest pain, shortness of breath Negative: Cough, congestion, fevers  Physical Exam  BP (!) 140/97   Pulse 71   Temp 98.2 F (36.8 C) (Oral)   Resp 17   SpO2 96%  Gen:   Awake, no distress    Resp:  Normal effort   MSK:   Moves extremities without difficulty   Other:     Medical Decision Making  Medically screening exam initiated at 7:26 PM.  Appropriate orders placed.  [name] was informed that the remainder of the evaluation will be completed by another provider, this initial triage assessment does not replace that evaluation, and the importance of remaining in the ED until their evaluation is complete.      Rolan Bucco, MD 10/28/22 [phone]

## 2022-10-28 NOTE — Discharge Instructions (Signed)
[name]:  Thank you for allowing [place] to take care of you today.  We hope you begin feeling better soon. You were seen today for chest pain.  Consecutive heart enzymes were normal, which is good news.  To-Do: Please follow-up with your primary doctor to schedule an appointment with a new primary care doctor within the next 2-3 days. A referral to follow-up with cardiology as an outpatient has been placed.  Please return to the Emergency Department or call 911 if you experience chest pain, shortness of breath, severe pain, severe fever, altered mental status, or have any reason to think that you need emergency medical care.  Thank you again.  Hope you feel better soon.

## 2022-10-28 NOTE — ED Notes (Signed)
Pt provided with AVS.  Education complete; all questions answered.  Pt leaving ED in stable condition at this time, ambulatory with all belongings. 

## 2022-11-01 ENCOUNTER — Ambulatory Visit: Payer: Commercial Managed Care - PPO | Admitting: Family Medicine

## 2022-11-01 ENCOUNTER — Encounter: Payer: Self-pay | Admitting: Family Medicine

## 2022-11-01 VITALS — BP 124/80 | HR 74 | Ht 72.0 in | Wt 239.0 lb

## 2022-11-01 DIAGNOSIS — I10 Essential (primary) hypertension: Secondary | ICD-10-CM

## 2022-11-01 MED ORDER — CLONIDINE HCL 0.1 MG PO TABS: 0.1000 mg | ORAL_TABLET | Freq: Two times a day (BID) | ORAL | 2 refills | Status: DC | PRN

## 2022-11-01 MED ORDER — NITROGLYCERIN 0.4 MG SL SUBL: 0.4000 mg | SUBLINGUAL_TABLET | SUBLINGUAL | 3 refills | Status: AC | PRN

## 2022-11-01 NOTE — Patient Instructions (Addendum)
        Great to see you today.  I have refilled the medication(s) we provide.    - Please take medications as prescribed. - Follow up with your primary health provider if any health concerns arises. - If symptoms worsen please contact your primary care provider and/or visit the emergency department.  

## 2022-11-01 NOTE — Progress Notes (Addendum)
Patient Office Visit   Subjective   Patient ID: [name], male    DOB: 09/24/1970  Age: 52 y.o. MRN: [id number]  CC:  Chief Complaint  Patient presents with   Follow-up    Patient is here for f/u from ED. Went to [facility] by EMS on 10/7 for chest pain. States his BP has been fluctuating.     HPI [name] 52 year old male, presents to the clinic for HTN follow up. He  has a past medical history of Anxiety and depression, Congestive heart failure (HCC) (?), Coronary artery disease, Gastroesophageal reflux disease, History of PSVT (paroxysmal supraventricular tachycardia), Hyperlipidemia, Hypertension, and Obstructive sleep apnea.For the details of today's visit, please refer to assessment and plan.   HPI    Outpatient Encounter Medications as of 11/01/2022  Medication Sig   albuterol (VENTOLIN HFA) 108 (90 Base) MCG/ACT inhaler Inhale 2 puffs into the lungs every 6 (six) hours as needed for wheezing or shortness of breath.   aspirin EC 81 MG tablet Take 1 tablet (81 mg total) by mouth daily. Swallow whole.   atorvastatin (LIPITOR) 40 MG tablet Take 1 tablet (40 mg total) by mouth daily.   cloNIDine (CATAPRES) 0.1 MG tablet Take 1 tablet (0.1 mg total) by mouth every 12 (twelve) hours as needed. Take 1 tablet by mouth if blood pressure over 170/100   esomeprazole (NEXIUM) 40 MG capsule Take 40 mg by mouth daily before breakfast.     ibuprofen (ADVIL,MOTRIN) 200 MG tablet Take 800 mg by mouth every 6 (six) hours as needed (for pain).    lamoTRIgine (LAMICTAL) 200 MG tablet Take 1 tablet by mouth once daily   metoprolol tartrate (LOPRESSOR) 25 MG tablet Take 1 tablet (25 mg total) by mouth 2 (two) times daily.   telmisartan (MICARDIS) 20 MG tablet Take 1 tablet (20 mg total) by mouth daily.   nitroGLYCERIN (NITROSTAT) 0.4 MG SL tablet Place 1 tablet (0.4 mg total) under the tongue every 5 (five) minutes as needed for chest pain.   [DISCONTINUED] nitroGLYCERIN (NITROSTAT) 0.4  MG SL tablet Place 1 tablet (0.4 mg total) under the tongue every 5 (five) minutes as needed for chest pain.   No facility-administered encounter medications on file as of 11/01/2022.    Past Surgical History:  Procedure Laterality Date   ANKLE SURGERY     Right   LEFT HEART CATHETERIZATION WITH CORONARY ANGIOGRAM N/A 02/21/2011   Procedure: LEFT HEART CATHETERIZATION WITH CORONARY ANGIOGRAM;  Surgeon: Tonny Bollman, MD;  Location: [facility] CATH LAB;  Service: Cardiovascular;  Laterality: N/A;   NASAL TURBINATE REDUCTION     SHOULDER SURGERY     Right    Review of Systems  Constitutional:  Negative for chills and fever.  Eyes:  Negative for blurred vision.  Respiratory:  Negative for shortness of breath.   Cardiovascular:  Negative for chest pain.  Neurological:  Negative for dizziness and headaches.      Objective    BP 124/80   Pulse 74   Ht 6' (1.829 m)   Wt 239 lb (108.4 kg)   SpO2 94%   BMI 32.41 kg/m   Physical Exam Vitals reviewed.  Constitutional:      General: He is not in acute distress.    Appearance: Normal appearance. He is not ill-appearing, toxic-appearing or diaphoretic.  HENT:     Head: Normocephalic.  Eyes:     General:        Right eye:  No discharge.        Left eye: No discharge.     Conjunctiva/sclera: Conjunctivae normal.  Cardiovascular:     Rate and Rhythm: Normal rate.     Pulses: Normal pulses.  Pulmonary:     Effort: Pulmonary effort is normal. No respiratory distress.  Musculoskeletal:        General: Normal range of motion.     Cervical back: Normal range of motion.  Skin:    General: Skin is warm and dry.     Capillary Refill: Capillary refill takes less than 2 seconds.  Neurological:     Mental Status: He is alert.  Psychiatric:        Mood and Affect: Mood normal.       Assessment & Plan:  Primary hypertension Assessment & Plan: Vitals:   11/01/22 1008  BP: 124/80    Blood pressure is controlled today. He recently  visited the emergency department due to a blood pressure reading of 190/100, as measured by paramedics, ER visit reading 150/100. The patient reports taking Telmisartan 20 mg and Metoprolol 25 mg twice daily. He notes that his blood pressure fluctuates, sometimes normal and at other times elevated. Clonidine 0.1 mg PRN is prescribed for instances when his blood pressure exceeds 170/100. The patient is advised to follow up with cardiology for further evaluation and management. Refilled nitroglycerin PRN   Other orders -     Nitroglycerin; Place 1 tablet (0.4 mg total) under the tongue every 5 (five) minutes as needed for chest pain.  Dispense: 25 tablet; Refill: 3 -     cloNIDine HCl; Take 1 tablet (0.1 mg total) by mouth every 12 (twelve) hours as needed. Take 1 tablet by mouth if blood pressure over 170/100  Dispense: 30 tablet; Refill: 2    Return in about 4 weeks (around 11/29/2022), or With Dr. Allena Katz, for re-check blood pressure, hypertension.   Cruzita Lederer Newman Nip, FNP

## 2022-11-01 NOTE — Assessment & Plan Note (Addendum)
Vitals:   11/01/22 1008  BP: 124/80    Blood pressure is controlled today. He recently visited the emergency department due to a blood pressure reading of 190/100, as measured by paramedics, ER visit reading 150/100. The patient reports taking Telmisartan 20 mg and Metoprolol 25 mg twice daily. He notes that his blood pressure fluctuates, sometimes normal and at other times elevated. Clonidine 0.1 mg PRN is prescribed for instances when his blood pressure exceeds 170/100. The patient is advised to follow up with cardiology for further evaluation and management. Refilled nitroglycerin PRN

## 2022-12-09 ENCOUNTER — Ambulatory Visit (INDEPENDENT_AMBULATORY_CARE_PROVIDER_SITE_OTHER): Payer: Commercial Managed Care - PPO | Admitting: Internal Medicine

## 2022-12-09 ENCOUNTER — Encounter: Payer: Self-pay | Admitting: Internal Medicine

## 2022-12-09 VITALS — BP 154/92 | HR 63 | Ht 72.0 in | Wt 239.4 lb

## 2022-12-09 DIAGNOSIS — I10 Essential (primary) hypertension: Secondary | ICD-10-CM

## 2022-12-09 DIAGNOSIS — Z8679 Personal history of other diseases of the circulatory system: Secondary | ICD-10-CM | POA: Diagnosis not present

## 2022-12-09 DIAGNOSIS — I25118 Atherosclerotic heart disease of native coronary artery with other forms of angina pectoris: Secondary | ICD-10-CM | POA: Diagnosis not present

## 2022-12-09 DIAGNOSIS — M792 Neuralgia and neuritis, unspecified: Secondary | ICD-10-CM

## 2022-12-09 MED ORDER — TELMISARTAN 20 MG PO TABS
20.0000 mg | ORAL_TABLET | Freq: Every day | ORAL | 1 refills | Status: DC
Start: 2022-12-09 — End: 2023-06-02

## 2022-12-09 NOTE — Progress Notes (Signed)
Established Patient Office Visit  Subjective:  Patient ID: [name], male    DOB: [birth year]  Age: 52 y.o. MRN: [id number]  CC:  Chief Complaint  Patient presents with   Hypertension    Four week follow up     HPI [name] is a 52 y.o. male with past medical history of PSVT s/p cardiac ablation, HTN, scalp cyst s/p excision, scalp neuralgia, GERD and HLD who presents for f/u of his chronic medical conditions.  He has history of PSVT s/p cardiac ablation.  He denies any chest pain, dyspnea or palpitations currently.  He is on metoprolol for PSVT and HTN.  He recently went to ER for chest pain and hypertensive urgency.  Of note, he has not been getting telmisartan.  Denies any recurrent chest pain episodes since then.  He was given clonidine by NP for as needed use for SBP > 170 at home, and has used it once.  He has history of a scalp cyst, which was excised by his dermatologist in the past.  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  Denies any dizziness, ear pain or ear discharge currently.  He has been taking Lamictal for scalp neuralgia since then.        Past Medical History:  Diagnosis Date   Anxiety and depression    2009-suicidal ideation, involuntary commitment   Congestive heart failure (HCC) ?   Coronary artery disease    Gastroesophageal reflux disease    History of PSVT (paroxysmal supraventricular tachycardia)    s/p ablation 2006; occasional palpitations; occasional atypical left chest pain; event recorder in 03/2011-normal sinus, sinus tachycardia, single symptomatic spell with sinus rhythm   Hyperlipidemia    Intolerant to statins causing tongue swelling   Hypertension    Lab 02/2011: Normal CBC and BMet, TSH-1.6   Obstructive sleep apnea    Wears CPAP intermittantly. Followed by Dr. Gildardo Cranker Pulmonology.    Past Surgical History:  Procedure Laterality Date   ANKLE SURGERY     Right   LEFT HEART CATHETERIZATION  WITH CORONARY ANGIOGRAM N/A 02/21/2011   Procedure: LEFT HEART CATHETERIZATION WITH CORONARY ANGIOGRAM;  Surgeon: Tonny Bollman, MD;  Location: [facility] CATH LAB;  Service: Cardiovascular;  Laterality: N/A;   NASAL TURBINATE REDUCTION     SHOULDER SURGERY     Right    Family History  Problem Relation Age of Onset   Heart disease Father    Heart attack Father 35   Heart disease Paternal Grandfather     Social History   Socioeconomic History   Marital status: Single    Spouse name: Not on file   Number of children: Not on file   Years of education: Not on file   Highest education level: Not on file  Occupational History   Occupation: Construction-grading  Tobacco Use   Smoking status: Every Day    Current packs/day: 1.00    Average packs/day: 1 pack/day for 35.4 years (35.4 ttl pk-yrs)    Types: Cigarettes    Start date: 06/30/1987   Smokeless tobacco: Never  Vaping Use   Vaping status: Never Used  Substance and Sexual Activity   Alcohol use: No    Alcohol/week: 0.0 standard drinks of alcohol   Drug use: No   Sexual activity: Not on file  Other Topics Concern   Not on file  Social History Narrative   Lives with ex girlfriend.  Grown daughters.  7 grands.  Social Determinants of Health   Financial Resource Strain: Not on file  Food Insecurity: Not on file  Transportation Needs: Not on file  Physical Activity: Not on file  Stress: Not on file  Social Connections: Unknown (06/01/2021)   Received from [facility], [facility]   Social Network    Social Network: Not on file  Intimate Partner Violence: Unknown (04/24/2021)   Received from [facility], [facility]   HITS    Physically Hurt: Not on file    Insult or Talk Down To: Not on file    Threaten Physical Harm: Not on file    Scream or Curse: Not on file    Outpatient Medications Prior to Visit  Medication Sig Dispense Refill   albuterol (VENTOLIN HFA) 108 (90 Base) MCG/ACT inhaler Inhale 2 puffs into the  lungs every 6 (six) hours as needed for wheezing or shortness of breath. 8 g 2   aspirin EC 81 MG tablet Take 1 tablet (81 mg total) by mouth daily. Swallow whole. 90 tablet 3   atorvastatin (LIPITOR) 40 MG tablet Take 1 tablet (40 mg total) by mouth daily. 90 tablet 1   cloNIDine (CATAPRES) 0.1 MG tablet Take 1 tablet (0.1 mg total) by mouth every 12 (twelve) hours as needed. Take 1 tablet by mouth if blood pressure over 170/100 30 tablet 2   esomeprazole (NEXIUM) 40 MG capsule Take 40 mg by mouth daily before breakfast.       ibuprofen (ADVIL,MOTRIN) 200 MG tablet Take 800 mg by mouth every 6 (six) hours as needed (for pain).      lamoTRIgine (LAMICTAL) 200 MG tablet Take 1 tablet by mouth once daily 90 tablet 0   metoprolol tartrate (LOPRESSOR) 25 MG tablet Take 1 tablet (25 mg total) by mouth 2 (two) times daily. 180 tablet 3   nitroGLYCERIN (NITROSTAT) 0.4 MG SL tablet Place 1 tablet (0.4 mg total) under the tongue every 5 (five) minutes as needed for chest pain. 25 tablet 3   telmisartan (MICARDIS) 20 MG tablet Take 1 tablet (20 mg total) by mouth daily. 90 tablet 1   No facility-administered medications prior to visit.    Allergies  Allergen Reactions   Statins Other (See Comments)    Causes flushing   Simvastatin Other (See Comments)    Caused flushing    ROS Review of Systems  Constitutional:  Negative for chills and fever.  HENT:  Negative for congestion, postnasal drip, sinus pressure and sore throat.   Eyes:  Negative for pain and discharge.  Respiratory:  Negative for cough and shortness of breath.   Cardiovascular:  Negative for chest pain and palpitations.  Gastrointestinal:  Negative for constipation, diarrhea, nausea and vomiting.  Endocrine: Negative for polydipsia and polyuria.  Genitourinary:  Negative for dysuria and hematuria.  Musculoskeletal:  Negative for neck pain and neck stiffness.  Skin:  Negative for rash.  Neurological:  Positive for headaches (Around  right ear/scalp area). Negative for dizziness, weakness and numbness.  Psychiatric/Behavioral:  Negative for agitation and behavioral problems.       Objective:    Physical Exam Vitals reviewed.  Constitutional:      General: He is not in acute distress.    Appearance: He is not diaphoretic.  HENT:     Head: Normocephalic and atraumatic.     Nose: No congestion.     Mouth/Throat:     Mouth: Mucous membranes are moist.  Eyes:     General: No scleral  icterus.    Extraocular Movements: Extraocular movements intact.  Cardiovascular:     Rate and Rhythm: Normal rate and regular rhythm.     Pulses: Normal pulses.     Heart sounds: Normal heart sounds. No murmur heard. Pulmonary:     Breath sounds: Normal breath sounds. No wheezing or rales.  Musculoskeletal:     Cervical back: Neck supple. No tenderness.     Right lower leg: No edema.     Left lower leg: No edema.  Skin:    General: Skin is warm.     Findings: No rash.     Comments: Skin thickness over scalp area near right ear, s/p cyst excision  Neurological:     General: No focal deficit present.     Mental Status: He is alert and oriented to person, place, and time.  Psychiatric:        Mood and Affect: Mood normal.        Behavior: Behavior normal.     BP (!) 154/92 (BP Location: Left Arm)   Pulse 63   Ht 6' (1.829 m)   Wt 239 lb 6.4 oz (108.6 kg)   SpO2 96%   BMI 32.47 kg/m  Wt Readings from Last 3 Encounters:  12/09/22 239 lb 6.4 oz (108.6 kg)  11/01/22 239 lb (108.4 kg)  09/17/22 235 lb 7.2 oz (106.8 kg)    Lab Results  Component Value Date   TSH 1.63 03/13/2011   Lab Results  Component Value Date   WBC 9.5 10/28/2022   HGB 14.2 10/28/2022   HCT 41.5 10/28/2022   MCV 91.0 10/28/2022   PLT 231 10/28/2022   Lab Results  Component Value Date   NA 139 10/28/2022   K 3.6 10/28/2022   CO2 23 10/28/2022   GLUCOSE 91 10/28/2022   BUN 7 10/28/2022   CREATININE 1.11 10/28/2022   BILITOT 0.3  08/09/2022   ALKPHOS 121 08/09/2022   AST 19 08/09/2022   ALT 28 08/09/2022   PROT 6.9 08/09/2022   ALBUMIN 4.6 08/09/2022   CALCIUM 8.9 10/28/2022   ANIONGAP 13 10/28/2022   EGFR 83 08/09/2022   GFR 86.11 03/13/2011   Lab Results  Component Value Date   CHOL 159 01/23/2022   Lab Results  Component Value Date   HDL 38 (L) 01/23/2022   Lab Results  Component Value Date   LDLCALC 79 01/23/2022   Lab Results  Component Value Date   TRIG 252 (H) 01/23/2022   Lab Results  Component Value Date   CHOLHDL 4.2 01/23/2022   Lab Results  Component Value Date   HGBA1C 5.8 (H) 01/23/2022      Assessment & Plan:   Problem List Items Addressed This Visit    Problem List Items Addressed This Visit       Cardiovascular and Mediastinum   Hypertension - Primary    BP Readings from Last 1 Encounters:  12/09/22 (!) 154/92   Uncontrolled with Metoprolol, but has not been getting Telmisartan Refilled Telmisartan - GoodRx coupon provided as he pays out-of-pocket Recent ER visit chart reviewed, including blood tests and EKG Counseled for compliance with the medications Advised DASH diet and moderate exercise/walking, at least 150 mins/week      Relevant Medications   telmisartan (MICARDIS) 20 MG tablet   Coronary artery disease of native artery of native heart with stable angina pectoris (HCC)    Nonobstructive CAD Followed by cardiology On aspirin and statin On beta-blocker Has nitroglycerin for  as needed use      Relevant Medications   telmisartan (MICARDIS) 20 MG tablet     Other   History of PSVT (paroxysmal supraventricular tachycardia)    S/p cardiac ablation in 2006 On Metoprolol 25 mg BID      Neuralgia involving scalp    Right periauricular area since cyst resection On Lamictal, well-controlled currently He has cold feeling, unclear if related to Metoprolol or Lamictal, denies claudication symptoms        Meds ordered this encounter  Medications    telmisartan (MICARDIS) 20 MG tablet    Sig: Take 1 tablet (20 mg total) by mouth daily.    Dispense:  90 tablet    Refill:  1    Follow-up: Return if symptoms worsen or fail to improve.    Anabel Halon, MD

## 2022-12-09 NOTE — Assessment & Plan Note (Signed)
S/p cardiac ablation in 2006 On Metoprolol 25 mg BID 

## 2022-12-09 NOTE — Assessment & Plan Note (Signed)
Nonobstructive CAD Followed by cardiology On aspirin and statin On beta-blocker Has nitroglycerin for as needed use

## 2022-12-09 NOTE — Assessment & Plan Note (Addendum)
BP Readings from Last 1 Encounters:  12/09/22 (!) 154/92   Uncontrolled with Metoprolol, but has not been getting Telmisartan Refilled Telmisartan - GoodRx coupon provided as he pays out-of-pocket Recent ER visit chart reviewed, including blood tests and EKG Counseled for compliance with the medications Advised DASH diet and moderate exercise/walking, at least 150 mins/week

## 2022-12-09 NOTE — Patient Instructions (Addendum)
Please start taking Telmisartan 20 mg once daily.  Please continue to take medications as prescribed.  Please continue to follow DASH diet and perform moderate exercise/walking at least 150 mins/week.

## 2022-12-09 NOTE — Assessment & Plan Note (Addendum)
Right periauricular area since cyst resection On Lamictal, well-controlled currently He has cold feeling, unclear if related to Metoprolol or Lamictal, denies claudication symptoms

## 2022-12-12 ENCOUNTER — Other Ambulatory Visit: Payer: Self-pay | Admitting: Internal Medicine

## 2022-12-12 DIAGNOSIS — M792 Neuralgia and neuritis, unspecified: Secondary | ICD-10-CM

## 2022-12-22 ENCOUNTER — Other Ambulatory Visit: Payer: Self-pay | Admitting: Internal Medicine

## 2022-12-22 DIAGNOSIS — M792 Neuralgia and neuritis, unspecified: Secondary | ICD-10-CM

## 2023-02-07 ENCOUNTER — Ambulatory Visit (INDEPENDENT_AMBULATORY_CARE_PROVIDER_SITE_OTHER): Payer: Commercial Managed Care - PPO | Admitting: Internal Medicine

## 2023-02-07 ENCOUNTER — Encounter: Payer: Self-pay | Admitting: Internal Medicine

## 2023-02-07 VITALS — BP 131/80 | HR 69 | Ht 72.0 in | Wt 240.8 lb

## 2023-02-07 DIAGNOSIS — I25118 Atherosclerotic heart disease of native coronary artery with other forms of angina pectoris: Secondary | ICD-10-CM

## 2023-02-07 DIAGNOSIS — M23206 Derangement of unspecified meniscus due to old tear or injury, right knee: Secondary | ICD-10-CM

## 2023-02-07 DIAGNOSIS — M25519 Pain in unspecified shoulder: Secondary | ICD-10-CM

## 2023-02-07 DIAGNOSIS — I10 Essential (primary) hypertension: Secondary | ICD-10-CM | POA: Diagnosis not present

## 2023-02-07 DIAGNOSIS — M792 Neuralgia and neuritis, unspecified: Secondary | ICD-10-CM

## 2023-02-07 DIAGNOSIS — Z9889 Other specified postprocedural states: Secondary | ICD-10-CM

## 2023-02-07 MED ORDER — MELOXICAM 15 MG PO TABS
15.0000 mg | ORAL_TABLET | Freq: Every day | ORAL | 1 refills | Status: DC
Start: 2023-02-07 — End: 2023-04-02

## 2023-02-07 NOTE — Assessment & Plan Note (Signed)
Nonobstructive CAD Followed by cardiology On aspirin and statin On beta-blocker Has nitroglycerin for as needed use

## 2023-02-07 NOTE — Progress Notes (Signed)
Established Patient Office Visit  Subjective:  Patient ID: [name], male    DOB: 1970/05/01  Age: 53 y.o. MRN: [id number]  CC:  Chief Complaint  Patient presents with   Hypertension    Follow up, pt reports joint pain would like to discuss pain is a 5 out of 10    HPI [name] is a 53 y.o. male with past medical history of PSVT s/p cardiac ablation, HTN, scalp cyst s/p excision, scalp neuralgia, GERD and HLD who presents for f/u of his chronic medical conditions.  He has history of PSVT s/p cardiac ablation.  He denies any chest pain, dyspnea or palpitations currently.  He is on metoprolol for PSVT and HTN.  He also takes telmisartan.  Denies any recurrent chest pain episodes recently.  He was given clonidine by NP for as needed use for SBP > 170 at home, and has used it once.  He has history of a scalp cyst, which was excised by his dermatologist in the past.  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  Denies any dizziness, ear pain or ear discharge currently.  He has been taking Lamictal for scalp neuralgia since then.  He complains of chronic right shoulder pain, has history of rotator cuff tear, s/p surgical repair.  Pain is constant, dull and worse with movement.  Denies any numbness or tingling of the UE.  He also reports right knee pain, has history of meniscal tear repair.  He is taking about 10 tablets of OTC Motrin every day recently for arthralgias.      Past Medical History:  Diagnosis Date   Anxiety and depression    2009-suicidal ideation, involuntary commitment   Congestive heart failure (HCC) ?   Coronary artery disease    Gastroesophageal reflux disease    History of PSVT (paroxysmal supraventricular tachycardia)    s/p ablation 2006; occasional palpitations; occasional atypical left chest pain; event recorder in 03/2011-normal sinus, sinus tachycardia, single symptomatic spell with sinus rhythm   Hyperlipidemia    Intolerant to  statins causing tongue swelling   Hypertension    Lab 02/2011: Normal CBC and BMet, TSH-1.6   Obstructive sleep apnea    Wears CPAP intermittantly. Followed by Dr. Gildardo Cranker Pulmonology.    Past Surgical History:  Procedure Laterality Date   ANKLE SURGERY     Right   LEFT HEART CATHETERIZATION WITH CORONARY ANGIOGRAM N/A 02/21/2011   Procedure: LEFT HEART CATHETERIZATION WITH CORONARY ANGIOGRAM;  Surgeon: Tonny Bollman, MD;  Location: [facility] CATH LAB;  Service: Cardiovascular;  Laterality: N/A;   NASAL TURBINATE REDUCTION     SHOULDER SURGERY     Right    Family History  Problem Relation Age of Onset   Heart disease Father    Heart attack Father 63   Heart disease Paternal Grandfather     Social History   Socioeconomic History   Marital status: Single    Spouse name: Not on file   Number of children: Not on file   Years of education: Not on file   Highest education level: Not on file  Occupational History   Occupation: Construction-grading  Tobacco Use   Smoking status: Every Day    Current packs/day: 1.00    Average packs/day: 1 pack/day for 35.6 years (35.6 ttl pk-yrs)    Types: Cigarettes    Start date: 06/30/1987   Smokeless tobacco: Never  Vaping Use   Vaping status: Never Used  Substance and Sexual Activity   Alcohol use: No    Alcohol/week: 0.0 standard drinks of alcohol   Drug use: No   Sexual activity: Not on file  Other Topics Concern   Not on file  Social History Narrative   Lives with ex girlfriend.  Grown daughters.  7 grands.     Social Drivers of Corporate investment banker Strain: Not on file  Food Insecurity: Not on file  Transportation Needs: Not on file  Physical Activity: Not on file  Stress: Not on file  Social Connections: Unknown (06/01/2021)   Received from [facility], [facility]   Social Network    Social Network: Not on file  Intimate Partner Violence: Unknown (04/24/2021)   Received from [facility], [facility]   HITS     Physically Hurt: Not on file    Insult or Talk Down To: Not on file    Threaten Physical Harm: Not on file    Scream or Curse: Not on file    Outpatient Medications Prior to Visit  Medication Sig Dispense Refill   albuterol (VENTOLIN HFA) 108 (90 Base) MCG/ACT inhaler Inhale 2 puffs into the lungs every 6 (six) hours as needed for wheezing or shortness of breath. 8 g 2   aspirin EC 81 MG tablet Take 1 tablet (81 mg total) by mouth daily. Swallow whole. 90 tablet 3   atorvastatin (LIPITOR) 40 MG tablet Take 1 tablet (40 mg total) by mouth daily. 90 tablet 1   cloNIDine (CATAPRES) 0.1 MG tablet Take 1 tablet (0.1 mg total) by mouth every 12 (twelve) hours as needed. Take 1 tablet by mouth if blood pressure over 170/100 30 tablet 2   esomeprazole (NEXIUM) 40 MG capsule Take 40 mg by mouth daily before breakfast.       ibuprofen (ADVIL,MOTRIN) 200 MG tablet Take 800 mg by mouth every 6 (six) hours as needed (for pain).      lamoTRIgine (LAMICTAL) 200 MG tablet Take 1 tablet by mouth once daily 90 tablet 0   metoprolol tartrate (LOPRESSOR) 25 MG tablet Take 1 tablet (25 mg total) by mouth 2 (two) times daily. 180 tablet 3   telmisartan (MICARDIS) 20 MG tablet Take 1 tablet (20 mg total) by mouth daily. 90 tablet 1   nitroGLYCERIN (NITROSTAT) 0.4 MG SL tablet Place 1 tablet (0.4 mg total) under the tongue every 5 (five) minutes as needed for chest pain. 25 tablet 3   No facility-administered medications prior to visit.    Allergies  Allergen Reactions   Statins Other (See Comments)    Causes flushing   Simvastatin Other (See Comments)    Caused flushing    ROS Review of Systems  Constitutional:  Negative for chills and fever.  HENT:  Negative for congestion, postnasal drip, sinus pressure and sore throat.   Eyes:  Negative for pain and discharge.  Respiratory:  Negative for cough and shortness of breath.   Cardiovascular:  Negative for chest pain and palpitations.   Gastrointestinal:  Negative for constipation, diarrhea, nausea and vomiting.  Endocrine: Negative for polydipsia and polyuria.  Genitourinary:  Negative for dysuria and hematuria.  Musculoskeletal:  Positive for arthralgias (Right shoulder, right knee). Negative for neck pain and neck stiffness.  Skin:  Negative for rash.  Neurological:  Positive for headaches (Around right ear/scalp area). Negative for dizziness, weakness and numbness.  Psychiatric/Behavioral:  Negative for agitation and behavioral problems.       Objective:  Physical Exam Vitals reviewed.  Constitutional:      General: He is not in acute distress.    Appearance: He is not diaphoretic.  HENT:     Head: Normocephalic and atraumatic.     Nose: No congestion.     Mouth/Throat:     Mouth: Mucous membranes are moist.  Eyes:     General: No scleral icterus.    Extraocular Movements: Extraocular movements intact.  Cardiovascular:     Rate and Rhythm: Normal rate and regular rhythm.     Pulses: Normal pulses.     Heart sounds: Normal heart sounds. No murmur heard. Pulmonary:     Breath sounds: Normal breath sounds. No wheezing or rales.  Musculoskeletal:     Right shoulder: No tenderness.     Cervical back: Neck supple. No tenderness.     Right knee: Swelling present. Tenderness present.     Right lower leg: No edema.     Left lower leg: No edema.     Comments: Painful ROM at right shoulder  Skin:    General: Skin is warm.     Findings: No rash.     Comments: Skin thickness over scalp area near right ear, s/p cyst excision  Neurological:     General: No focal deficit present.     Mental Status: He is alert and oriented to person, place, and time.  Psychiatric:        Mood and Affect: Mood normal.        Behavior: Behavior normal.     BP 131/80   Pulse 69   Ht 6' (1.829 m)   Wt 240 lb 12.8 oz (109.2 kg)   SpO2 95%   BMI 32.66 kg/m  Wt Readings from Last 3 Encounters:  02/07/23 240 lb 12.8 oz  (109.2 kg)  12/09/22 239 lb 6.4 oz (108.6 kg)  11/01/22 239 lb (108.4 kg)    Lab Results  Component Value Date   TSH 1.63 03/13/2011   Lab Results  Component Value Date   WBC 9.5 10/28/2022   HGB 14.2 10/28/2022   HCT 41.5 10/28/2022   MCV 91.0 10/28/2022   PLT 231 10/28/2022   Lab Results  Component Value Date   NA 139 10/28/2022   K 3.6 10/28/2022   CO2 23 10/28/2022   GLUCOSE 91 10/28/2022   BUN 7 10/28/2022   CREATININE 1.11 10/28/2022   BILITOT 0.3 08/09/2022   ALKPHOS 121 08/09/2022   AST 19 08/09/2022   ALT 28 08/09/2022   PROT 6.9 08/09/2022   ALBUMIN 4.6 08/09/2022   CALCIUM 8.9 10/28/2022   ANIONGAP 13 10/28/2022   EGFR 83 08/09/2022   GFR 86.11 03/13/2011   Lab Results  Component Value Date   CHOL 159 01/23/2022   Lab Results  Component Value Date   HDL 38 (L) 01/23/2022   Lab Results  Component Value Date   LDLCALC 79 01/23/2022   Lab Results  Component Value Date   TRIG 252 (H) 01/23/2022   Lab Results  Component Value Date   CHOLHDL 4.2 01/23/2022   Lab Results  Component Value Date   HGBA1C 5.8 (H) 01/23/2022      Assessment & Plan:   Problem List Items Addressed This Visit    Problem List Items Addressed This Visit       Cardiovascular and Mediastinum   Hypertension   BP Readings from Last 1 Encounters:  02/07/23 131/80   Well-controlled with Metoprolol and Telmisartan now Counseled  for compliance with the medications Advised DASH diet and moderate exercise/walking, at least 150 mins/week      Coronary artery disease of native artery of native heart with stable angina pectoris (HCC)   Nonobstructive CAD Followed by cardiology On aspirin and statin On beta-blocker Has nitroglycerin for as needed use      Relevant Medications   meloxicam (MOBIC) 15 MG tablet     Musculoskeletal and Integument   Old tear of meniscus of right knee   Acute right knee pain status post fall Decreased ROM and swelling on physical  exam H/o ligament tear Started meloxicam 15 mg once daily - although for short-term as he takes Aspirin for h/o CAD Advised to take Tylenol arthritis as needed as a safer option If persistent, will need orthopedic surgery evaluation      Relevant Medications   meloxicam (MOBIC) 15 MG tablet     Other   Neuralgia involving scalp   Right periauricular area since cyst resection On Lamictal, well-controlled currently He has cold feeling, unclear if related to Metoprolol or Lamictal, denies claudication symptoms      Shoulder pain with history of repair of rotator cuff - Primary   Acute on chronic shoulder pain, since 12/08/22 Has history of rotator cuff surgery Started meloxicam 15 mg once daily - although for short-term as he takes Aspirin for h/o CAD Advised to take Tylenol arthritis as needed as a safer option If persistent, will need orthopedic surgery evaluation      Relevant Medications   meloxicam (MOBIC) 15 MG tablet      Meds ordered this encounter  Medications   meloxicam (MOBIC) 15 MG tablet    Sig: Take 1 tablet (15 mg total) by mouth daily.    Dispense:  30 tablet    Refill:  1    Follow-up: Return in about 4 months (around 06/07/2023).    Anabel Halon, MD

## 2023-02-07 NOTE — Assessment & Plan Note (Signed)
BP Readings from Last 1 Encounters:  02/07/23 131/80   Well-controlled with Metoprolol and Telmisartan now Counseled for compliance with the medications Advised DASH diet and moderate exercise/walking, at least 150 mins/week

## 2023-02-07 NOTE — Patient Instructions (Addendum)
Please take Meloxicam for short-term for shoulder and knee pain. Okay to take Tylenol arthritis as longer term option, up to 3 times daily.  Please continue to take medications as prescribed.  Please continue to follow low salt diet and perform moderate exercise/walking at least 150 mins/week.

## 2023-02-07 NOTE — Assessment & Plan Note (Signed)
Acute right knee pain status post fall Decreased ROM and swelling on physical exam H/o ligament tear Started meloxicam 15 mg once daily - although for short-term as he takes Aspirin for h/o CAD Advised to take Tylenol arthritis as needed as a safer option If persistent, will need orthopedic surgery evaluation

## 2023-02-07 NOTE — Assessment & Plan Note (Signed)
Acute on chronic shoulder pain, since 12/08/22 Has history of rotator cuff surgery Started meloxicam 15 mg once daily - although for short-term as he takes Aspirin for h/o CAD Advised to take Tylenol arthritis as needed as a safer option If persistent, will need orthopedic surgery evaluation

## 2023-02-07 NOTE — Assessment & Plan Note (Signed)
 Right periauricular area since cyst resection On Lamictal, well-controlled currently He has cold feeling, unclear if related to Metoprolol or Lamictal, denies claudication symptoms

## 2023-04-02 ENCOUNTER — Other Ambulatory Visit: Payer: Self-pay | Admitting: Internal Medicine

## 2023-04-02 DIAGNOSIS — M25519 Pain in unspecified shoulder: Secondary | ICD-10-CM

## 2023-04-02 DIAGNOSIS — M23206 Derangement of unspecified meniscus due to old tear or injury, right knee: Secondary | ICD-10-CM

## 2023-05-31 ENCOUNTER — Other Ambulatory Visit: Payer: Self-pay | Admitting: Internal Medicine

## 2023-05-31 DIAGNOSIS — I10 Essential (primary) hypertension: Secondary | ICD-10-CM

## 2023-06-09 ENCOUNTER — Ambulatory Visit: Payer: Commercial Managed Care - PPO | Admitting: Internal Medicine

## 2023-06-15 ENCOUNTER — Other Ambulatory Visit: Payer: Self-pay | Admitting: Internal Medicine

## 2023-06-15 DIAGNOSIS — M792 Neuralgia and neuritis, unspecified: Secondary | ICD-10-CM

## 2023-06-18 ENCOUNTER — Encounter: Payer: Self-pay | Admitting: Internal Medicine

## 2023-06-22 ENCOUNTER — Other Ambulatory Visit: Payer: Self-pay | Admitting: Internal Medicine

## 2023-06-22 DIAGNOSIS — E782 Mixed hyperlipidemia: Secondary | ICD-10-CM

## 2023-06-25 ENCOUNTER — Other Ambulatory Visit: Payer: Self-pay | Admitting: Internal Medicine

## 2023-06-25 DIAGNOSIS — M23206 Derangement of unspecified meniscus due to old tear or injury, right knee: Secondary | ICD-10-CM

## 2023-06-25 DIAGNOSIS — M25519 Pain in unspecified shoulder: Secondary | ICD-10-CM

## 2023-07-08 ENCOUNTER — Ambulatory Visit (INDEPENDENT_AMBULATORY_CARE_PROVIDER_SITE_OTHER): Payer: Self-pay | Admitting: Internal Medicine

## 2023-07-08 ENCOUNTER — Encounter: Payer: Self-pay | Admitting: Internal Medicine

## 2023-07-08 VITALS — BP 172/94 | HR 72 | Ht 72.0 in | Wt 236.8 lb

## 2023-07-08 DIAGNOSIS — Z9889 Other specified postprocedural states: Secondary | ICD-10-CM

## 2023-07-08 DIAGNOSIS — M792 Neuralgia and neuritis, unspecified: Secondary | ICD-10-CM | POA: Diagnosis not present

## 2023-07-08 DIAGNOSIS — R079 Chest pain, unspecified: Secondary | ICD-10-CM | POA: Diagnosis not present

## 2023-07-08 DIAGNOSIS — Z8679 Personal history of other diseases of the circulatory system: Secondary | ICD-10-CM | POA: Diagnosis not present

## 2023-07-08 DIAGNOSIS — I25118 Atherosclerotic heart disease of native coronary artery with other forms of angina pectoris: Secondary | ICD-10-CM

## 2023-07-08 DIAGNOSIS — M25519 Pain in unspecified shoulder: Secondary | ICD-10-CM

## 2023-07-08 DIAGNOSIS — I10 Essential (primary) hypertension: Secondary | ICD-10-CM | POA: Diagnosis not present

## 2023-07-08 DIAGNOSIS — G4752 REM sleep behavior disorder: Secondary | ICD-10-CM | POA: Insufficient documentation

## 2023-07-08 DIAGNOSIS — R7303 Prediabetes: Secondary | ICD-10-CM

## 2023-07-08 DIAGNOSIS — G4733 Obstructive sleep apnea (adult) (pediatric): Secondary | ICD-10-CM

## 2023-07-08 DIAGNOSIS — E782 Mixed hyperlipidemia: Secondary | ICD-10-CM

## 2023-07-08 DIAGNOSIS — M23206 Derangement of unspecified meniscus due to old tear or injury, right knee: Secondary | ICD-10-CM

## 2023-07-08 MED ORDER — MELOXICAM 15 MG PO TABS: 15.0000 mg | ORAL_TABLET | Freq: Every day | ORAL | 1 refills | Status: AC

## 2023-07-08 MED ORDER — TRAZODONE HCL 50 MG PO TABS
25.0000 mg | ORAL_TABLET | Freq: Every evening | ORAL | 3 refills | Status: DC | PRN
Start: 2023-07-08 — End: 2023-11-03

## 2023-07-08 MED ORDER — TELMISARTAN 80 MG PO TABS: 80.0000 mg | ORAL_TABLET | Freq: Every day | ORAL | 1 refills | Status: DC

## 2023-07-08 NOTE — Assessment & Plan Note (Signed)
 BP Readings from Last 1 Encounters:  07/08/23 (!) 158/100   Uncontrolled with Metoprolol  25 mg BID and Telmisartan  20 mg QD Increased dose of Telmisartan  to 80 mg once daily, considering the intensity of HTN Counseled for compliance with the medications Advised DASH diet and moderate exercise/walking, at least 150 mins/week

## 2023-07-08 NOTE — Assessment & Plan Note (Signed)
 Right periauricular area since cyst resection On Lamictal, well-controlled currently He has cold feeling, unclear if related to Metoprolol or Lamictal, denies claudication symptoms

## 2023-07-08 NOTE — Assessment & Plan Note (Signed)
 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

## 2023-07-08 NOTE — Assessment & Plan Note (Signed)
 Chest pain episodes today EKG: Sinus rhythm.  No signs of active ischemia.  Advised to take nitroglycerin  as needed for chest pain Needs cardiology follow-up

## 2023-07-08 NOTE — Assessment & Plan Note (Addendum)
 Check lipid profile On Lipitor 40 mg daily now Advised to take fish oil as well

## 2023-07-08 NOTE — Addendum Note (Signed)
 Addended byCleola Dach on: 07/08/2023 06:37 PM   Modules accepted: Orders

## 2023-07-08 NOTE — Patient Instructions (Signed)
 Please start taking telmisartan  80 mg once daily instead of 20 mg.  Please start taking trazodone as needed for insomnia.  Please maintain simple sleep hygiene. - Maintain dark and non-noisy environment in the bedroom. - Please use the bedroom for sleep and sexual activity only. - Do not use electronic devices in the bedroom. - Please take dinner at least 2 hours before bedtime. - Please avoid caffeinated products in the evening, including coffee, soft drinks. - Please try to maintain the regular sleep-wake cycle - Go to bed and wake up at the same time.

## 2023-07-08 NOTE — Progress Notes (Addendum)
 Established Patient Office Visit  Subjective:  Patient ID: [name], male    DOB: 01-29-1970  Age: 53 y.o. MRN: [id number]  CC:  Chief Complaint  Patient presents with   Medical Management of Chronic Issues    4 month f/u, needs medication refills. Has questions about a 90 day supply on arthritis medication.    Insomnia    States trouble sleeping, feels like his legs are jumpy also. Ongoing since 3 months ago.    Hypertension    Reports high readings on his bp at home.     HPI [name] is a 53 y.o. male with past medical history of PSVT s/p cardiac ablation, HTN, scalp cyst s/p excision, scalp neuralgia, GERD and HLD who presents for f/u of his chronic medical conditions.  He has history of PSVT s/p cardiac ablation.  He denies any chest pain, dyspnea or palpitations currently.  He is on metoprolol  for PSVT and HTN.  His BP was significantly elevated today.  He reports having elevated BP at home, around 170s/100s as well.  He reports intermittent chest pain episodes today, lasting about few minutes, unrelated to activity.  He has not tried taking nitroglycerin  today.  He has history of a scalp cyst, which was excised by his dermatologist in the past.  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  Denies any dizziness, ear pain or ear discharge currently.  He has been taking Lamictal  for scalp neuralgia since then.        Past Medical History:  Diagnosis Date   Anxiety and depression    2009-suicidal ideation, involuntary commitment   Congestive heart failure (HCC) ?   Coronary artery disease    Gastroesophageal reflux disease    History of PSVT (paroxysmal supraventricular tachycardia)    s/p ablation 2006; occasional palpitations; occasional atypical left chest pain; event recorder in 03/2011-normal sinus, sinus tachycardia, single symptomatic spell with sinus rhythm   Hyperlipidemia    Intolerant to statins causing tongue swelling    Hypertension    Lab 02/2011: Normal CBC and BMet, TSH-1.6   Obstructive sleep apnea    Wears CPAP intermittantly. Followed by Dr. Norvin Bees Pulmonology.    Past Surgical History:  Procedure Laterality Date   ANKLE SURGERY     Right   LEFT HEART CATHETERIZATION WITH CORONARY ANGIOGRAM N/A 02/21/2011   Procedure: LEFT HEART CATHETERIZATION WITH CORONARY ANGIOGRAM;  Surgeon: Arnoldo Lapping, MD;  Location: [facility] CATH LAB;  Service: Cardiovascular;  Laterality: N/A;   NASAL TURBINATE REDUCTION     SHOULDER SURGERY     Right    Family History  Problem Relation Age of Onset   Heart disease Father    Heart attack Father 24   Heart disease Paternal Grandfather     Social History   Socioeconomic History   Marital status: Single    Spouse name: Not on file   Number of children: Not on file   Years of education: Not on file   Highest education level: Not on file  Occupational History   Occupation: Construction-grading  Tobacco Use   Smoking status: Every Day    Current packs/day: 1.00    Average packs/day: 1 pack/day for 36.0 years (36.0 ttl pk-yrs)    Types: Cigarettes    Start date: 06/30/1987   Smokeless tobacco: Never  Vaping Use   Vaping status: Never Used  Substance and Sexual Activity   Alcohol use: No    Alcohol/week:  0.0 standard drinks of alcohol   Drug use: No   Sexual activity: Not on file  Other Topics Concern   Not on file  Social History Narrative   Lives with ex girlfriend.  Grown daughters.  7 grands.     Social Drivers of Corporate investment banker Strain: Not on file  Food Insecurity: Not on file  Transportation Needs: Not on file  Physical Activity: Not on file  Stress: Not on file  Social Connections: Unknown (06/01/2021)   [facility] from [facility]   Social Network    Social Network: Not on file  Intimate Partner Violence: Unknown (04/24/2021)   Received from [facility]   HITS    Physically Hurt: Not on file    Insult or Talk Down To: Not  on file    Threaten Physical Harm: Not on file    Scream or Curse: Not on file    Outpatient Medications Prior to Visit  Medication Sig Dispense Refill   albuterol  (VENTOLIN  HFA) 108 (90 Base) MCG/ACT inhaler Inhale 2 puffs into the lungs every 6 (six) hours as needed for wheezing or shortness of breath. 8 g 2   aspirin  EC 81 MG tablet Take 1 tablet (81 mg total) by mouth daily. Swallow whole. 90 tablet 3   atorvastatin  (LIPITOR) 40 MG tablet Take 1 tablet by mouth once daily 90 tablet 0   cloNIDine  (CATAPRES ) 0.1 MG tablet Take 1 tablet (0.1 mg total) by mouth every 12 (twelve) hours as needed. Take 1 tablet by mouth if blood pressure over 170/100 30 tablet 2   esomeprazole (NEXIUM) 40 MG capsule Take 40 mg by mouth daily before breakfast.       lamoTRIgine  (LAMICTAL ) 200 MG tablet Take 1 tablet by mouth once daily 30 tablet 0   metoprolol  tartrate (LOPRESSOR ) 25 MG tablet Take 1 tablet (25 mg total) by mouth 2 (two) times daily. 180 tablet 3   nitroGLYCERIN  (NITROSTAT ) 0.4 MG SL tablet Place 1 tablet (0.4 mg total) under the tongue every 5 (five) minutes as needed for chest pain. 25 tablet 3   meloxicam  (MOBIC ) 15 MG tablet Take 1 tablet by mouth once daily 30 tablet 0   telmisartan  (MICARDIS ) 20 MG tablet Take 1 tablet by mouth once daily 90 tablet 0   No facility-administered medications prior to visit.    Allergies  Allergen Reactions   Statins Other (See Comments)    Causes flushing   Simvastatin Other (See Comments)    Caused flushing    ROS Review of Systems  Constitutional:  Negative for chills and fever.  HENT:  Negative for congestion, postnasal drip, sinus pressure and sore throat.   Eyes:  Negative for pain and discharge.  Respiratory:  Negative for cough and shortness of breath.   Cardiovascular:  Negative for chest pain and palpitations.  Gastrointestinal:  Negative for constipation, diarrhea, nausea and vomiting.  Endocrine: Negative for polydipsia and polyuria.   Genitourinary:  Negative for dysuria and hematuria.  Musculoskeletal:  Negative for neck pain and neck stiffness.  Skin:  Negative for rash.  Neurological:  Positive for headaches (Around right ear/scalp area). Negative for dizziness, weakness and numbness.  Psychiatric/Behavioral:  Negative for agitation and behavioral problems.       Objective:    Physical Exam Vitals reviewed.  Constitutional:      General: He is not in acute distress.    Appearance: He is not diaphoretic.  HENT:     Head: Normocephalic  and atraumatic.     Nose: No congestion.     Mouth/Throat:     Mouth: Mucous membranes are moist.   Eyes:     General: No scleral icterus.    Extraocular Movements: Extraocular movements intact.    Cardiovascular:     Rate and Rhythm: Normal rate and regular rhythm.     Pulses: Normal pulses.     Heart sounds: Normal heart sounds. No murmur heard. Pulmonary:     Breath sounds: Normal breath sounds. No wheezing or rales.   Musculoskeletal:     Cervical back: Neck supple. No tenderness.     Right lower leg: No edema.     Left lower leg: No edema.   Skin:    General: Skin is warm.     Findings: No rash.     Comments: Skin thickness over scalp area near right ear, s/p cyst excision   Neurological:     General: No focal deficit present.     Mental Status: He is alert and oriented to person, place, and time.   Psychiatric:        Mood and Affect: Mood normal.        Behavior: Behavior normal.     BP (!) 172/94 (BP Location: Left Arm)   Pulse 72   Ht 6' (1.829 m)   Wt 236 lb 12.8 oz (107.4 kg)   SpO2 93%   BMI 32.12 kg/m  Wt Readings from Last 3 Encounters:  07/08/23 236 lb 12.8 oz (107.4 kg)  02/07/23 240 lb 12.8 oz (109.2 kg)  12/09/22 239 lb 6.4 oz (108.6 kg)    Lab Results  Component Value Date   TSH 1.63 03/13/2011   Lab Results  Component Value Date   WBC 9.5 10/28/2022   HGB 14.2 10/28/2022   HCT 41.5 10/28/2022   MCV 91.0 10/28/2022    PLT 231 10/28/2022   Lab Results  Component Value Date   NA 139 10/28/2022   K 3.6 10/28/2022   CO2 23 10/28/2022   GLUCOSE 91 10/28/2022   BUN 7 10/28/2022   CREATININE 1.11 10/28/2022   BILITOT 0.3 08/09/2022   ALKPHOS 121 08/09/2022   AST 19 08/09/2022   ALT 28 08/09/2022   PROT 6.9 08/09/2022   ALBUMIN 4.6 08/09/2022   CALCIUM  8.9 10/28/2022   ANIONGAP 13 10/28/2022   EGFR 83 08/09/2022   GFR 86.11 03/13/2011   Lab Results  Component Value Date   CHOL 159 01/23/2022   Lab Results  Component Value Date   HDL 38 (L) 01/23/2022   Lab Results  Component Value Date   LDLCALC 79 01/23/2022   Lab Results  Component Value Date   TRIG 252 (H) 01/23/2022   Lab Results  Component Value Date   CHOLHDL 4.2 01/23/2022   Lab Results  Component Value Date   HGBA1C 5.8 (H) 01/23/2022      Assessment & Plan:   Problem List Items Addressed This Visit    Problem List Items Addressed This Visit       Cardiovascular and Mediastinum   Hypertension - Primary   BP Readings from Last 1 Encounters:  07/08/23 (!) 158/100   Uncontrolled with Metoprolol  25 mg BID and Telmisartan  20 mg QD Increased dose of Telmisartan  to 80 mg once daily, considering the intensity of HTN Counseled for compliance with the medications Advised DASH diet and moderate exercise/walking, at least 150 mins/week      Relevant Medications   telmisartan  (MICARDIS )  80 MG tablet   Other Relevant Orders   TSH   CMP14+EGFR   CBC with Differential/Platelet   EKG 12-Lead (Completed)   Coronary artery disease of native artery of native heart with stable angina pectoris (HCC)   Nonobstructive CAD Followed by cardiology On aspirin  and statin On beta-blocker Has nitroglycerin  for as needed use      Relevant Medications   telmisartan  (MICARDIS ) 80 MG tablet   traZODone (DESYREL) 50 MG tablet   meloxicam  (MOBIC ) 15 MG tablet     Respiratory   Obstructive sleep apnea   His dyspnea/apnea episodes  could be due to OSA, but he reports that he had repeat sleep study, which did not show OSA? Has LE movements at nighttime as well, likely REM sleep related movement disorder Trazodone for insomnia If persistent symptoms, will refer to pulmonology        Musculoskeletal and Integument   Old tear of meniscus of right knee   Relevant Medications   meloxicam  (MOBIC ) 15 MG tablet     Other   Neuralgia involving scalp (Chronic)   Right periauricular area since cyst resection On Lamictal , well-controlled currently He has cold feeling, unclear if related to Metoprolol  or Lamictal , denies claudication symptoms      Relevant Orders   TSH   Chest pain   Chest pain episodes today EKG: Sinus rhythm.  No signs of active ischemia.  Advised to take nitroglycerin  as needed for chest pain Needs cardiology follow-up      History of PSVT (paroxysmal supraventricular tachycardia)   S/p cardiac ablation in 2006 On Metoprolol  25 mg BID      Relevant Orders   TSH   CMP14+EGFR   CBC with Differential/Platelet   Mixed hyperlipidemia   Check lipid profile On Lipitor 40 mg daily now Advised to take fish oil as well      Relevant Medications   telmisartan  (MICARDIS ) 80 MG tablet   Other Relevant Orders   Lipid panel   Shoulder pain with history of repair of rotator cuff   Relevant Medications   meloxicam  (MOBIC ) 15 MG tablet   Sleep behavior disorder, REM   Has insomnia and restless movements at nighttime, reported by spouse Likely REM sleep behavior disorder Added trazodone as needed for insomnia If persistent, will need sleep specialist evaluation      Relevant Medications   traZODone (DESYREL) 50 MG tablet   Other Visit Diagnoses       Prediabetes       Relevant Orders   Hemoglobin A1c         Meds ordered this encounter  Medications   telmisartan  (MICARDIS ) 80 MG tablet    Sig: Take 1 tablet (80 mg total) by mouth daily.    Dispense:  90 tablet    Refill:  1     Dose change - 07/08/23   traZODone (DESYREL) 50 MG tablet    Sig: Take 0.5-1 tablets (25-50 mg total) by mouth at bedtime as needed for sleep.    Dispense:  30 tablet    Refill:  3   meloxicam  (MOBIC ) 15 MG tablet    Sig: Take 1 tablet (15 mg total) by mouth daily.    Dispense:  90 tablet    Refill:  1    Follow-up: Return in about 4 weeks (around 08/05/2023) for HTN.    Meldon Sport, MD

## 2023-07-08 NOTE — Assessment & Plan Note (Addendum)
 His dyspnea/apnea episodes could be due to OSA, but he reports that he had repeat sleep study, which did not show OSA? 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

## 2023-07-08 NOTE — Assessment & Plan Note (Signed)
S/p cardiac ablation in 2006 On Metoprolol 25 mg BID 

## 2023-07-08 NOTE — Assessment & Plan Note (Signed)
Nonobstructive CAD Followed by cardiology On aspirin and statin On beta-blocker Has nitroglycerin for as needed use

## 2023-07-15 ENCOUNTER — Other Ambulatory Visit: Payer: Self-pay | Admitting: Internal Medicine

## 2023-07-15 DIAGNOSIS — M792 Neuralgia and neuritis, unspecified: Secondary | ICD-10-CM

## 2023-08-05 ENCOUNTER — Ambulatory Visit (INDEPENDENT_AMBULATORY_CARE_PROVIDER_SITE_OTHER): Admitting: Internal Medicine

## 2023-08-05 ENCOUNTER — Encounter: Payer: Self-pay | Admitting: Internal Medicine

## 2023-08-05 VITALS — BP 148/86 | HR 71 | Ht 72.0 in | Wt 235.8 lb

## 2023-08-05 DIAGNOSIS — M792 Neuralgia and neuritis, unspecified: Secondary | ICD-10-CM

## 2023-08-05 DIAGNOSIS — I10 Essential (primary) hypertension: Secondary | ICD-10-CM

## 2023-08-05 DIAGNOSIS — J0110 Acute frontal sinusitis, unspecified: Secondary | ICD-10-CM | POA: Insufficient documentation

## 2023-08-05 DIAGNOSIS — Z8679 Personal history of other diseases of the circulatory system: Secondary | ICD-10-CM

## 2023-08-05 DIAGNOSIS — J0100 Acute maxillary sinusitis, unspecified: Secondary | ICD-10-CM | POA: Insufficient documentation

## 2023-08-05 MED ORDER — AMOXICILLIN-POT CLAVULANATE 875-125 MG PO TABS
1.0000 | ORAL_TABLET | Freq: Two times a day (BID) | ORAL | 0 refills | Status: DC
Start: 2023-08-05 — End: 2023-08-11

## 2023-08-05 MED ORDER — HYDROCHLOROTHIAZIDE 25 MG PO TABS: 25.0000 mg | ORAL_TABLET | Freq: Every day | ORAL | 2 refills | Status: DC

## 2023-08-05 NOTE — Patient Instructions (Signed)
 Please start taking hydrochlorothiazide  25 mg as prescribed.  Please continue to take medications as prescribed.  Please continue to follow low salt diet and perform moderate exercise/walking at least 150 mins/week.  Please start taking Augmentin  as prescribed for sinusitis.  Please continue to use Flonase  and use vaporizer for nasal congestion.

## 2023-08-05 NOTE — Assessment & Plan Note (Signed)
 Started empiric Augmentin  as he has persistent symptoms despite symptomatic treatment Flonase  for nasal congestion/allergies Advised to use vaporizer and/or sinus inhaler for nasal congestion

## 2023-08-05 NOTE — Assessment & Plan Note (Signed)
 BP Readings from Last 1 Encounters:  08/05/23 (!) 142/88   Uncontrolled with Metoprolol  25 mg BID and Telmisartan  80 mg QD Dose of telmisartan  was increased in the last visit Added HCTZ 25 mg QD Counseled for compliance with the medications Advised DASH diet and moderate exercise/walking, at least 150 mins/week

## 2023-08-05 NOTE — Assessment & Plan Note (Signed)
S/p cardiac ablation in 2006 On Metoprolol 25 mg BID 

## 2023-08-05 NOTE — Progress Notes (Signed)
 Established Patient Office Visit  Subjective:  Patient ID: [name], male    DOB: [birth year]  Age: 53 y.o. MRN: [id number]  CC:  Chief Complaint  Patient presents with   Hypertension    F/u , reports high readings at home, has some pressure on the back of his neck and some headache.     HPI [name] is a 53 y.o. male with past medical history of PSVT s/p cardiac ablation, HTN, scalp cyst s/p excision, scalp neuralgia, GERD and HLD who presents for f/u of his chronic medical conditions.  He has history of PSVT s/p cardiac ablation.  He denies any chest pain, dyspnea or palpitations currently.  He takes telmisartan  80 mg QD regularly.  He is on metoprolol  25 mg BID for PSVT and HTN.  His BP was still elevated today.  He reports having elevated BP at home, around 170s/100s as well.  He has history of a scalp cyst, which was excised by his dermatologist in the past.  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  Denies any dizziness, ear pain or ear discharge currently.  He has been taking Lamictal  for scalp neuralgia since then.  He reports nasal congestion, sinus pressure related headache and postnasal drip for the last 2 weeks.  He has tried Flonase  without much relief.  Denies any fever or chills.      Past Medical History:  Diagnosis Date   Anxiety and depression    2009-suicidal ideation, involuntary commitment   Congestive heart failure (HCC) ?   Coronary artery disease    Gastroesophageal reflux disease    History of PSVT (paroxysmal supraventricular tachycardia)    s/p ablation 2006; occasional palpitations; occasional atypical left chest pain; event recorder in 03/2011-normal sinus, sinus tachycardia, single symptomatic spell with sinus rhythm   Hyperlipidemia    Intolerant to statins causing tongue swelling   Hypertension    Lab 02/2011: Normal CBC and BMet, TSH-1.6   Obstructive sleep apnea    Wears CPAP intermittantly. Followed by Dr.  Brien Finn [facility].    Past Surgical History:  Procedure Laterality Date   ANKLE SURGERY     Right   LEFT HEART CATHETERIZATION WITH CORONARY ANGIOGRAM N/A 02/21/2011   Procedure: LEFT HEART CATHETERIZATION WITH CORONARY ANGIOGRAM;  Surgeon: Ozell Fell, MD;  Location[facility] CATH LAB;  Service: Cardiovascular;  Laterality: N/A;   NASAL TURBINATE REDUCTION     SHOULDER SURGERY     Right    Family History  Problem Relation Age of Onset   Heart disease Father    Heart attack Father 58   Heart disease Paternal Grandfather     Social History   Socioeconomic History   Marital status: Single    Spouse name: Not on file   Number of children: Not on file   Years of education: Not on file   Highest education level: Not on file  Occupational History   Occupation: Construction-grading  Tobacco Use   Smoking status: Every Day    Current packs/day: 1.00    Average packs/day: 1 pack/day for 36.1 years (36.1 ttl pk-yrs)    Types: Cigarettes    Start date: 06/30/1987   Smokeless tobacco: Never  Vaping Use   Vaping status: Never Used  Substance and Sexual Activity   Alcohol use: No    Alcohol/week: 0.0 standard drinks of alcohol   Drug use: No   Sexual activity: Not on file  Other Topics Concern  Not on file  Social History Narrative   Lives with ex girlfriend.  Grown daughters.  7 grands.     Social Drivers of Corporate investment banker Strain: Not on file  Food Insecurity: Not on file  Transportation Needs: Not on file  Physical Activity: Not on file  Stress: Not on file  Social Connections: Unknown (06/01/2021)   Received from [facility]   Social Network    Social Network: Not on file  Intimate Partner Violence: Unknown (04/24/2021)   Received from [facility]   HITS    Physically Hurt: Not on file    Insult or Talk Down To: Not on file    Threaten Physical Harm: Not on file    Scream or Curse: Not on file    Outpatient Medications Prior to Visit   Medication Sig Dispense Refill   albuterol  (VENTOLIN  HFA) 108 (90 Base) MCG/ACT inhaler Inhale 2 puffs into the lungs every 6 (six) hours as needed for wheezing or shortness of breath. 8 g 2   aspirin  EC 81 MG tablet Take 1 tablet (81 mg total) by mouth daily. Swallow whole. 90 tablet 3   atorvastatin  (LIPITOR) 40 MG tablet Take 1 tablet by mouth once daily 90 tablet 0   esomeprazole (NEXIUM) 40 MG capsule Take 40 mg by mouth daily before breakfast.       lamoTRIgine  (LAMICTAL ) 200 MG tablet TAKE 1 TABLET BY MOUTH ONCE DAILY . APPOINTMENT REQUIRED FOR FUTURE REFILLS 30 tablet 0   meloxicam  (MOBIC ) 15 MG tablet Take 1 tablet (15 mg total) by mouth daily. 90 tablet 1   metoprolol  tartrate (LOPRESSOR ) 25 MG tablet Take 1 tablet (25 mg total) by mouth 2 (two) times daily. 180 tablet 3   nitroGLYCERIN  (NITROSTAT ) 0.4 MG SL tablet Place 1 tablet (0.4 mg total) under the tongue every 5 (five) minutes as needed for chest pain. 25 tablet 3   telmisartan  (MICARDIS ) 80 MG tablet Take 1 tablet (80 mg total) by mouth daily. 90 tablet 1   traZODone  (DESYREL ) 50 MG tablet Take 0.5-1 tablets (25-50 mg total) by mouth at bedtime as needed for sleep. 30 tablet 3   cloNIDine  (CATAPRES ) 0.1 MG tablet Take 1 tablet (0.1 mg total) by mouth every 12 (twelve) hours as needed. Take 1 tablet by mouth if blood pressure over 170/100 30 tablet 2   No facility-administered medications prior to visit.    Allergies  Allergen Reactions   Statins Other (See Comments)    Causes flushing   Simvastatin Other (See Comments)    Caused flushing    ROS Review of Systems  Constitutional:  Negative for chills and fever.  HENT:  Negative for congestion, postnasal drip, sinus pressure and sore throat.   Eyes:  Negative for pain and discharge.  Respiratory:  Negative for cough and shortness of breath.   Cardiovascular:  Negative for chest pain and palpitations.  Gastrointestinal:  Negative for constipation, diarrhea, nausea and  vomiting.  Endocrine: Negative for polydipsia and polyuria.  Genitourinary:  Negative for dysuria and hematuria.  Musculoskeletal:  Negative for neck pain and neck stiffness.  Skin:  Negative for rash.  Neurological:  Positive for headaches (Around right ear/scalp area). Negative for dizziness, weakness and numbness.  Psychiatric/Behavioral:  Negative for agitation and behavioral problems.       Objective:    Physical Exam Vitals reviewed.  Constitutional:      General: He is not in acute distress.    Appearance: He  is not diaphoretic.  HENT:     Head: Normocephalic and atraumatic.     Nose: No congestion.     Mouth/Throat:     Mouth: Mucous membranes are moist.  Eyes:     General: No scleral icterus.    Extraocular Movements: Extraocular movements intact.  Cardiovascular:     Rate and Rhythm: Normal rate and regular rhythm.     Pulses: Normal pulses.     Heart sounds: Normal heart sounds. No murmur heard. Pulmonary:     Breath sounds: Normal breath sounds. No wheezing or rales.  Musculoskeletal:     Cervical back: Neck supple. No tenderness.     Right lower leg: No edema.     Left lower leg: No edema.  Skin:    General: Skin is warm.     Findings: No rash.     Comments: Skin thickness over scalp area near right ear, s/p cyst excision  Neurological:     General: No focal deficit present.     Mental Status: He is alert and oriented to person, place, and time.  Psychiatric:        Mood and Affect: Mood normal.        Behavior: Behavior normal.     BP (!) 148/86 (BP Location: Left Arm)   Pulse 71   Ht 6' (1.829 m)   Wt 235 lb 12.8 oz (107 kg)   SpO2 98%   BMI 31.98 kg/m  Wt Readings from Last 3 Encounters:  08/05/23 235 lb 12.8 oz (107 kg)  07/08/23 236 lb 12.8 oz (107.4 kg)  02/07/23 240 lb 12.8 oz (109.2 kg)    Lab Results  Component Value Date   TSH 1.63 03/13/2011   Lab Results  Component Value Date   WBC 9.5 10/28/2022   HGB 14.2 10/28/2022   HCT  41.5 10/28/2022   MCV 91.0 10/28/2022   PLT 231 10/28/2022   Lab Results  Component Value Date   NA 139 10/28/2022   K 3.6 10/28/2022   CO2 23 10/28/2022   GLUCOSE 91 10/28/2022   BUN 7 10/28/2022   CREATININE 1.11 10/28/2022   BILITOT 0.3 08/09/2022   ALKPHOS 121 08/09/2022   AST 19 08/09/2022   ALT 28 08/09/2022   PROT 6.9 08/09/2022   ALBUMIN 4.6 08/09/2022   CALCIUM  8.9 10/28/2022   ANIONGAP 13 10/28/2022   EGFR 83 08/09/2022   GFR 86.11 03/13/2011   Lab Results  Component Value Date   CHOL 159 01/23/2022   Lab Results  Component Value Date   HDL 38 (L) 01/23/2022   Lab Results  Component Value Date   LDLCALC 79 01/23/2022   Lab Results  Component Value Date   TRIG 252 (H) 01/23/2022   Lab Results  Component Value Date   CHOLHDL 4.2 01/23/2022   Lab Results  Component Value Date   HGBA1C 5.8 (H) 01/23/2022      Assessment & Plan:   Problem List Items Addressed This Visit    Problem List Items Addressed This Visit       Cardiovascular and Mediastinum   Hypertension - Primary   BP Readings from Last 1 Encounters:  08/05/23 (!) 142/88   Uncontrolled with Metoprolol  25 mg BID and Telmisartan  80 mg QD Dose of telmisartan  was increased in the last visit Added HCTZ 25 mg QD Counseled for compliance with the medications Advised DASH diet and moderate exercise/walking, at least 150 mins/week      Relevant Medications  hydrochlorothiazide  (HYDRODIURIL ) 25 MG tablet     Respiratory   Acute non-recurrent frontal sinusitis   Started empiric Augmentin  as he has persistent symptoms despite symptomatic treatment Flonase  for nasal congestion/allergies Advised to use vaporizer and/or sinus inhaler for nasal congestion      Relevant Medications   amoxicillin -clavulanate (AUGMENTIN ) 875-125 MG tablet     Other   Neuralgia involving scalp (Chronic)   Right periauricular area since cyst resection On Lamictal , well-controlled currently He has cold  feeling, unclear if related to Metoprolol  or Lamictal , denies claudication symptoms      History of PSVT (paroxysmal supraventricular tachycardia)   S/p cardiac ablation in 2006 On Metoprolol  25 mg BID          Meds ordered this encounter  Medications   hydrochlorothiazide  (HYDRODIURIL ) 25 MG tablet    Sig: Take 1 tablet (25 mg total) by mouth daily.    Dispense:  30 tablet    Refill:  2   amoxicillin -clavulanate (AUGMENTIN ) 875-125 MG tablet    Sig: Take 1 tablet by mouth 2 (two) times daily.    Dispense:  14 tablet    Refill:  0    Follow-up: Return in about 2 months (around 10/06/2023) for HTN.    Suzzane MARLA Blanch, MD

## 2023-08-05 NOTE — Assessment & Plan Note (Signed)
 Right periauricular area since cyst resection On Lamictal, well-controlled currently He has cold feeling, unclear if related to Metoprolol or Lamictal, denies claudication symptoms

## 2023-08-06 ENCOUNTER — Ambulatory Visit: Payer: Self-pay | Admitting: Internal Medicine

## 2023-08-06 LAB — CBC WITH DIFFERENTIAL/PLATELET
Basophils Absolute: 0 x10E3/uL (ref 0.0–0.2)
Basos: 0 %
EOS (ABSOLUTE): 0.2 x10E3/uL (ref 0.0–0.4)
Eos: 2 %
Hematocrit: 47.5 % (ref 37.5–51.0)
Hemoglobin: 15.9 g/dL (ref 13.0–17.7)
Immature Grans (Abs): 0 x10E3/uL (ref 0.0–0.1)
Immature Granulocytes: 0 %
Lymphocytes Absolute: 2.8 x10E3/uL (ref 0.7–3.1)
Lymphs: 24 %
MCH: 32 pg (ref 26.6–33.0)
MCHC: 33.5 g/dL (ref 31.5–35.7)
MCV: 96 fL (ref 79–97)
Monocytes Absolute: 1 x10E3/uL — ABNORMAL HIGH (ref 0.1–0.9)
Monocytes: 9 %
Neutrophils Absolute: 7.4 x10E3/uL — ABNORMAL HIGH (ref 1.4–7.0)
Neutrophils: 65 %
Platelets: 260 x10E3/uL (ref 150–450)
RBC: 4.97 x10E6/uL (ref 4.14–5.80)
RDW: 13.3 % (ref 11.6–15.4)
WBC: 11.4 x10E3/uL — ABNORMAL HIGH (ref 3.4–10.8)

## 2023-08-06 LAB — CMP14+EGFR
ALT: 20 IU/L (ref 0–44)
AST: 16 IU/L (ref 0–40)
Albumin: 4.5 g/dL (ref 3.8–4.9)
Alkaline Phosphatase: 102 IU/L (ref 44–121)
BUN/Creatinine Ratio: 9 (ref 9–20)
BUN: 9 mg/dL (ref 6–24)
Bilirubin Total: 0.3 mg/dL (ref 0.0–1.2)
CO2: 21 mmol/L (ref 20–29)
Calcium: 9.4 mg/dL (ref 8.7–10.2)
Chloride: 103 mmol/L (ref 96–106)
Creatinine, Ser: 0.95 mg/dL (ref 0.76–1.27)
Globulin, Total: 2.1 g/dL (ref 1.5–4.5)
Glucose: 86 mg/dL (ref 70–99)
Potassium: 3.8 mmol/L (ref 3.5–5.2)
Sodium: 142 mmol/L (ref 134–144)
Total Protein: 6.6 g/dL (ref 6.0–8.5)
eGFR: 96 mL/min/1.73 (ref >59)

## 2023-08-06 LAB — LIPID PANEL
Chol/HDL Ratio: 5 ratio (ref 0.0–5.0)
Cholesterol, Total: 150 mg/dL (ref 100–199)
HDL: 30 mg/dL — ABNORMAL LOW (ref >39)
LDL Chol Calc (NIH): 69 mg/dL (ref 0–99)
Triglycerides: 320 mg/dL — ABNORMAL HIGH (ref 0–149)
VLDL Cholesterol Cal: 51 mg/dL — ABNORMAL HIGH (ref 5–40)

## 2023-08-06 LAB — TSH: TSH: 2.05 u[IU]/mL (ref 0.450–4.500)

## 2023-08-06 LAB — HEMOGLOBIN A1C
Est. average glucose Bld gHb Est-mCnc: 111 mg/dL
Hgb A1c MFr Bld: 5.5 % (ref 4.8–5.6)

## 2023-08-07 ENCOUNTER — Telehealth: Payer: Self-pay

## 2023-08-07 NOTE — Telephone Encounter (Signed)
 Copied from CRM [id number]. Topic: Clinical - Lab/Test Results >> Aug 07, 2023 10:10 AM Larissa RAMAN wrote: Reason for CRM:  Att: Joanne-Patient requesting a callback regarding lab results.   ----------------------------------------------------------------------- From previous Reason for Contact - Other: Reason for CRM:

## 2023-08-07 NOTE — Telephone Encounter (Signed)
Spoke to pt went over labs 

## 2023-08-11 ENCOUNTER — Telehealth: Payer: Self-pay | Admitting: Internal Medicine

## 2023-08-11 ENCOUNTER — Ambulatory Visit (INDEPENDENT_AMBULATORY_CARE_PROVIDER_SITE_OTHER): Admitting: Internal Medicine

## 2023-08-11 ENCOUNTER — Ambulatory Visit: Payer: Self-pay

## 2023-08-11 ENCOUNTER — Ambulatory Visit

## 2023-08-11 ENCOUNTER — Encounter: Payer: Self-pay | Admitting: Internal Medicine

## 2023-08-11 VITALS — BP 116/75 | HR 75 | Ht 72.0 in | Wt 234.6 lb

## 2023-08-11 DIAGNOSIS — I10 Essential (primary) hypertension: Secondary | ICD-10-CM

## 2023-08-11 DIAGNOSIS — M5030 Other cervical disc degeneration, unspecified cervical region: Secondary | ICD-10-CM | POA: Insufficient documentation

## 2023-08-11 DIAGNOSIS — Z8679 Personal history of other diseases of the circulatory system: Secondary | ICD-10-CM | POA: Diagnosis not present

## 2023-08-11 DIAGNOSIS — R42 Dizziness and giddiness: Secondary | ICD-10-CM | POA: Diagnosis not present

## 2023-08-11 MED ORDER — METHYLPREDNISOLONE 4 MG PO TBPK
ORAL_TABLET | ORAL | 0 refills | Status: DC
Start: 2023-08-11 — End: 2023-10-08

## 2023-08-11 NOTE — Assessment & Plan Note (Signed)
 Likely related to hypotensive spells today Advised to take HCTZ half tablet for now Maintain at least 64 ounces of fluid intake in a day Work note provided for today

## 2023-08-11 NOTE — Patient Instructions (Signed)
 Please take Prednisone  as prescribed.  Please take hydrochlorothiazide  half tablet for now.  Please continue to take medications as prescribed.  Please continue to follow low salt diet and perform moderate exercise/walking at least 150 mins/week.

## 2023-08-11 NOTE — Telephone Encounter (Signed)
 FYI Only or Action Required?: Action required by provider: request for appointment.  Patient was last seen in primary care on 08/05/2023 by [name], MD.  Called Nurse Triage reporting Low BP.  Symptoms began yesterday.  Interventions attempted: Nothing.  Symptoms are: gradually worsening. Instructed to go to ED, requests confirmation from PCP. Spoke with [name] in the practice who agrees.  Triage Disposition: Go to ED Now (or PCP Triage)  Patient/caregiver understands and will follow disposition?: Yes   Copied from CRM [id number]. Topic: Clinical - Red Word Triage >> Aug 11, 2023  7:56 AM [name] wrote: Red Word that prompted transfer to Nurse Triage: Patient is calling to report that he blood pressure is dropped starting on Saturday. Today- 89/54, Patient at at work he does not have the readings.   hydrochlorothiazide  (HYDRODIURIL ) 25 MG tablet [[id number]] added to medication list on 08/05/23  Patient is reporting nausea, and dizziness. And dry heath Reason for Disposition  [1] Fall in systolic BP > 20 mm Hg from normal AND [2] feeling weak or lightheaded  Answer Assessment - Initial Assessment Questions 1. BLOOD PRESSURE: What is your blood pressure? Did you take at least two measurements 5 minutes apart?     89/54 2. ONSET: When did you take your blood pressure?     Today 3. HOW: How did you take your blood pressure? (e.g., visiting nurse, automatic home BP monitor)     Home cuff 4. HISTORY: Do you have a history of low blood pressure? What is your blood pressure normally?     no 5. MEDICINES: Are you taking any medicines for blood pressure? If Yes, ask: Have they been changed recently?     yes 6. PULSE RATE: Do you know what your pulse rate is?      no 7. OTHER SYMPTOMS: Have you been sick recently? Have you had a recent injury?     no 8. PREGNANCY: Is there any chance you are pregnant? When was your last menstrual period?      N/a  Protocols used: Blood Pressure - Low-A-AH

## 2023-08-11 NOTE — Assessment & Plan Note (Signed)
 Recent worsening of neck pain Pain getting better with extension, concerning for cervical spinal stenosis Will start with x-ray of cervical spine Medrol  Dosepak for acute neck pain Will avoid muscle relaxer for now due to concurrent dizziness Can use warm compresses as needed for pain

## 2023-08-11 NOTE — Telephone Encounter (Signed)
 Spoke to [facility], stated they recommended to go to ED.

## 2023-08-11 NOTE — Progress Notes (Signed)
 Acute Office Visit  Subjective:    Patient ID: [name], male    DOB: May 15, 1970, 53 y.o.   MRN: [id number]  Chief Complaint  Patient presents with   Dizziness    Pt reports sx of feeling dizzy and low blood pressure concerns. States he feels tingly all over and pain in the back of his neck. States he started new medication regimen last week.     HPI Patient is in today for evaluation of hypotension.  He had an episode of dizziness this morning, and his BP was 89/54 at his workplace.  Of note, he was recently placed on HCTZ 25 mg QD in addition to telmisartan  80 mg QD for uncontrolled blood pressure.  He has been checking his BP daily since the last visit, and has noticed improvement in blood pressure in 120s-130s/70s except today.  He denies any recent worsening of chest pain or palpitations.  Does not report recent episode of vomiting or diarrhea.  He recently had acute sinusitis, has completed Augmentin  now.  He also reports recent worsening of neck pain, which is constant, worse with forward bending and better with extension of the neck.  He takes meloxicam  15 mg QD for chronic shoulder pain.  Denies any recent injury or fall.  Past Medical History:  Diagnosis Date   Anxiety and depression    2009-suicidal ideation, involuntary commitment   Congestive heart failure (HCC) ?   Coronary artery disease    Gastroesophageal reflux disease    History of PSVT (paroxysmal supraventricular tachycardia)    s/p ablation 2006; occasional palpitations; occasional atypical left chest pain; event recorder in 03/2011-normal sinus, sinus tachycardia, single symptomatic spell with sinus rhythm   Hyperlipidemia    Intolerant to statins causing tongue swelling   Hypertension    Lab 02/2011: Normal CBC and BMet, TSH-1.6   Obstructive sleep apnea    Wears CPAP intermittantly. Followed by Dr. Brien Finn Pulmonology.    Past Surgical History:  Procedure Laterality Date   ANKLE SURGERY      Right   LEFT HEART CATHETERIZATION WITH CORONARY ANGIOGRAM N/A 02/21/2011   Procedure: LEFT HEART CATHETERIZATION WITH CORONARY ANGIOGRAM;  Surgeon: Ozell Fell, MD;  Location: [facility] CATH LAB;  Service: Cardiovascular;  Laterality: N/A;   NASAL TURBINATE REDUCTION     SHOULDER SURGERY     Right    Family History  Problem Relation Age of Onset   Heart disease Father    Heart attack Father 41   Heart disease Paternal Grandfather     Social History   Socioeconomic History   Marital status: Single    Spouse name: Not on file   Number of children: Not on file   Years of education: Not on file   Highest education level: Not on file  Occupational History   Occupation: Construction-grading  Tobacco Use   Smoking status: Every Day    Current packs/day: 1.00    Average packs/day: 1 pack/day for 36.1 years (36.1 ttl pk-yrs)    Types: Cigarettes    Start date: 06/30/1987   Smokeless tobacco: Never  Vaping Use   Vaping status: Never Used  Substance and Sexual Activity   Alcohol use: No    Alcohol/week: 0.0 standard drinks of alcohol   Drug use: No   Sexual activity: Not on file  Other Topics Concern   Not on file  Social History Narrative   Lives with ex girlfriend.  Grown daughters.  7 grands.  Social Drivers of Corporate investment banker Strain: Not on file  Food Insecurity: Not on file  Transportation Needs: Not on file  Physical Activity: Not on file  Stress: Not on file  Social Connections: Unknown (06/01/2021)   Received from [facility]   Social Network    Social Network: Not on file  Intimate Partner Violence: Unknown (04/24/2021)   Received from [facility]   HITS    Physically Hurt: Not on file    Insult or Talk Down To: Not on file    Threaten Physical Harm: Not on file    Scream or Curse: Not on file    Outpatient Medications Prior to Visit  Medication Sig Dispense Refill   albuterol  (VENTOLIN  HFA) 108 (90 Base) MCG/ACT inhaler Inhale 2 puffs into  the lungs every 6 (six) hours as needed for wheezing or shortness of breath. 8 g 2   aspirin  EC 81 MG tablet Take 1 tablet (81 mg total) by mouth daily. Swallow whole. 90 tablet 3   atorvastatin  (LIPITOR) 40 MG tablet Take 1 tablet by mouth once daily 90 tablet 0   esomeprazole (NEXIUM) 40 MG capsule Take 40 mg by mouth daily before breakfast.       hydrochlorothiazide  (HYDRODIURIL ) 25 MG tablet Take 1 tablet (25 mg total) by mouth daily. 30 tablet 2   lamoTRIgine  (LAMICTAL ) 200 MG tablet TAKE 1 TABLET BY MOUTH ONCE DAILY . APPOINTMENT REQUIRED FOR FUTURE REFILLS 30 tablet 0   meloxicam  (MOBIC ) 15 MG tablet Take 1 tablet (15 mg total) by mouth daily. 90 tablet 1   metoprolol  tartrate (LOPRESSOR ) 25 MG tablet Take 1 tablet (25 mg total) by mouth 2 (two) times daily. 180 tablet 3   nitroGLYCERIN  (NITROSTAT ) 0.4 MG SL tablet Place 1 tablet (0.4 mg total) under the tongue every 5 (five) minutes as needed for chest pain. 25 tablet 3   telmisartan  (MICARDIS ) 80 MG tablet Take 1 tablet (80 mg total) by mouth daily. 90 tablet 1   traZODone  (DESYREL ) 50 MG tablet Take 0.5-1 tablets (25-50 mg total) by mouth at bedtime as needed for sleep. 30 tablet 3   amoxicillin -clavulanate (AUGMENTIN ) 875-125 MG tablet Take 1 tablet by mouth 2 (two) times daily. 14 tablet 0   No facility-administered medications prior to visit.    Allergies  Allergen Reactions   Statins Other (See Comments)    Causes flushing   Simvastatin Other (See Comments)    Caused flushing    Review of Systems  Constitutional:  Negative for chills and fever.  HENT:  Negative for congestion, postnasal drip, sinus pressure and sore throat.   Eyes:  Negative for pain and discharge.  Respiratory:  Negative for cough and shortness of breath.   Cardiovascular:  Negative for chest pain and palpitations.  Gastrointestinal:  Negative for diarrhea, nausea and vomiting.  Endocrine: Negative for polydipsia and polyuria.  Genitourinary:  Negative  for dysuria and hematuria.  Musculoskeletal:  Positive for neck pain. Negative for neck stiffness.  Skin:  Negative for rash.  Neurological:  Positive for headaches (Around right ear/scalp area). Negative for dizziness, weakness and numbness.  Psychiatric/Behavioral:  Negative for agitation and behavioral problems.        Objective:    Physical Exam Vitals reviewed.  Constitutional:      General: He is not in acute distress.    Appearance: He is not diaphoretic.  HENT:     Head: Normocephalic and atraumatic.     Nose: No  congestion.     Mouth/Throat:     Mouth: Mucous membranes are moist.  Eyes:     General: No scleral icterus.    Extraocular Movements: Extraocular movements intact.  Cardiovascular:     Rate and Rhythm: Normal rate and regular rhythm.     Heart sounds: Normal heart sounds. No murmur heard. Pulmonary:     Breath sounds: Normal breath sounds. No wheezing or rales.  Musculoskeletal:     Cervical back: Neck supple. Tenderness present.     Right lower leg: No edema.     Left lower leg: No edema.  Skin:    General: Skin is warm.     Findings: No rash.     Comments: Skin thickness over scalp area near right ear, s/p cyst excision  Neurological:     General: No focal deficit present.     Mental Status: He is alert and oriented to person, place, and time.  Psychiatric:        Mood and Affect: Mood normal.        Behavior: Behavior normal.     BP 116/75   Pulse 75   Ht 6' (1.829 m)   Wt 234 lb 9.6 oz (106.4 kg)   SpO2 96%   BMI 31.82 kg/m  Wt Readings from Last 3 Encounters:  08/11/23 234 lb 9.6 oz (106.4 kg)  08/05/23 235 lb 12.8 oz (107 kg)  07/08/23 236 lb 12.8 oz (107.4 kg)        Assessment & Plan:   Problem List Items Addressed This Visit       Cardiovascular and Mediastinum   Hypertension - Primary (Chronic)   BP Readings from Last 1 Encounters:  08/11/23 116/75   Was uncontrolled with Metoprolol  25 mg BID and Telmisartan  80 mg QD in  the last visit Had added HCTZ 25 mg QD, but due to hypotensive spell today, advised to take half tablet of HCTZ for now Counseled for compliance with the medications Advised DASH diet and moderate exercise/walking, at least 150 mins/week        Musculoskeletal and Integument   DDD (degenerative disc disease), cervical   Recent worsening of neck pain Pain getting better with extension, concerning for cervical spinal stenosis Will start with x-ray of cervical spine Medrol  Dosepak for acute neck pain Will avoid muscle relaxer for now due to concurrent dizziness Can use warm compresses as needed for pain      Relevant Medications   methylPREDNISolone  (MEDROL  DOSEPAK) 4 MG TBPK tablet   Other Relevant Orders   DG Cervical Spine Complete     Other   History of PSVT (paroxysmal supraventricular tachycardia) (Chronic)   S/p cardiac ablation in 2006 On Metoprolol  25 mg BID Had EKG in 06/25, showed sinus rhythm without any acute signs of ischemia      Dizziness   Likely related to hypotensive spells today Advised to take HCTZ half tablet for now Maintain at least 64 ounces of fluid intake in a day Work note provided for today        Meds ordered this encounter  Medications   methylPREDNISolone  (MEDROL  DOSEPAK) 4 MG TBPK tablet    Sig: Take as package instructions.    Dispense:  1 each    Refill:  0     Leianna Barga MARLA Blanch, MD

## 2023-08-11 NOTE — Assessment & Plan Note (Signed)
 S/p cardiac ablation in 2006 On Metoprolol  25 mg BID Had EKG in 06/25, showed sinus rhythm without any acute signs of ischemia

## 2023-08-11 NOTE — Progress Notes (Signed)
 Patient is in office today for a nurse visit for Blood Pressure Check. Patient blood pressure was 117/77, Patient complaining of being dizzy and nauseous

## 2023-08-11 NOTE — Assessment & Plan Note (Signed)
 BP Readings from Last 1 Encounters:  08/11/23 116/75   Was uncontrolled with Metoprolol  25 mg BID and Telmisartan  80 mg QD in the last visit Had added HCTZ 25 mg QD, but due to hypotensive spell today, advised to take half tablet of HCTZ for now Counseled for compliance with the medications Advised DASH diet and moderate exercise/walking, at least 150 mins/week

## 2023-08-11 NOTE — Telephone Encounter (Signed)
 Patient called blood pressure low and dizzy.  89/54. Sent teams message out to the clinical to speak to the patient.

## 2023-08-12 ENCOUNTER — Other Ambulatory Visit: Payer: Self-pay | Admitting: Internal Medicine

## 2023-08-12 DIAGNOSIS — M792 Neuralgia and neuritis, unspecified: Secondary | ICD-10-CM

## 2023-08-31 ENCOUNTER — Other Ambulatory Visit: Payer: Self-pay | Admitting: Internal Medicine

## 2023-08-31 DIAGNOSIS — I10 Essential (primary) hypertension: Secondary | ICD-10-CM

## 2023-09-01 ENCOUNTER — Ambulatory Visit: Admitting: Internal Medicine

## 2023-09-11 ENCOUNTER — Other Ambulatory Visit: Payer: Self-pay | Admitting: Internal Medicine

## 2023-09-11 DIAGNOSIS — M792 Neuralgia and neuritis, unspecified: Secondary | ICD-10-CM

## 2023-09-17 ENCOUNTER — Other Ambulatory Visit: Payer: Self-pay | Admitting: Internal Medicine

## 2023-09-17 DIAGNOSIS — E782 Mixed hyperlipidemia: Secondary | ICD-10-CM

## 2023-09-23 ENCOUNTER — Other Ambulatory Visit: Payer: Self-pay | Admitting: Internal Medicine

## 2023-09-23 DIAGNOSIS — I10 Essential (primary) hypertension: Secondary | ICD-10-CM

## 2023-09-23 DIAGNOSIS — Z8679 Personal history of other diseases of the circulatory system: Secondary | ICD-10-CM

## 2023-10-07 ENCOUNTER — Other Ambulatory Visit: Payer: Self-pay | Admitting: Internal Medicine

## 2023-10-07 DIAGNOSIS — M792 Neuralgia and neuritis, unspecified: Secondary | ICD-10-CM

## 2023-10-08 ENCOUNTER — Encounter: Payer: Self-pay | Admitting: Internal Medicine

## 2023-10-08 ENCOUNTER — Ambulatory Visit (INDEPENDENT_AMBULATORY_CARE_PROVIDER_SITE_OTHER): Admitting: Internal Medicine

## 2023-10-08 VITALS — BP 138/74 | HR 85 | Ht 72.0 in | Wt 242.6 lb

## 2023-10-08 DIAGNOSIS — Z8679 Personal history of other diseases of the circulatory system: Secondary | ICD-10-CM | POA: Diagnosis not present

## 2023-10-08 DIAGNOSIS — E782 Mixed hyperlipidemia: Secondary | ICD-10-CM

## 2023-10-08 DIAGNOSIS — I10 Essential (primary) hypertension: Secondary | ICD-10-CM

## 2023-10-08 DIAGNOSIS — M792 Neuralgia and neuritis, unspecified: Secondary | ICD-10-CM

## 2023-10-08 DIAGNOSIS — J0100 Acute maxillary sinusitis, unspecified: Secondary | ICD-10-CM

## 2023-10-08 MED ORDER — ATORVASTATIN CALCIUM 40 MG PO TABS: 40.0000 mg | ORAL_TABLET | Freq: Every day | ORAL | 3 refills | Status: AC

## 2023-10-08 MED ORDER — LAMOTRIGINE 200 MG PO TABS: 200.0000 mg | ORAL_TABLET | Freq: Every day | ORAL | 1 refills | Status: AC

## 2023-10-08 MED ORDER — METOPROLOL TARTRATE 25 MG PO TABS: 25.0000 mg | ORAL_TABLET | Freq: Two times a day (BID) | ORAL | 3 refills | Status: AC

## 2023-10-08 MED ORDER — HYDROCHLOROTHIAZIDE 12.5 MG PO TABS: 12.5000 mg | ORAL_TABLET | Freq: Every day | ORAL | 1 refills | Status: DC

## 2023-10-08 MED ORDER — FLUTICASONE PROPIONATE 50 MCG/ACT NA SUSP: 2.0000 | Freq: Every day | NASAL | 1 refills | Status: AC

## 2023-10-08 MED ORDER — HYDROCHLOROTHIAZIDE 12.5 MG PO TABS: 12.5000 mg | ORAL_TABLET | Freq: Every day | ORAL | 1 refills | Status: AC

## 2023-10-08 MED ORDER — AMOXICILLIN-POT CLAVULANATE 875-125 MG PO TABS
1.0000 | ORAL_TABLET | Freq: Two times a day (BID) | ORAL | 0 refills | Status: AC
Start: 2023-10-08 — End: ?

## 2023-10-08 NOTE — Assessment & Plan Note (Signed)
 Check lipid profile On Lipitor 40 mg daily now Advised to take fish oil as well

## 2023-10-08 NOTE — Patient Instructions (Signed)
 Please start taking Hydrochlorothiazide  12.5 mg once daily.  Please start taking Augmentin  for sinusitis. Please use Flonase  for nasal congestion/allergies.  Please continue to take medications as prescribed.  Please continue to follow low salt diet and perform moderate exercise/walking as tolerated.

## 2023-10-08 NOTE — Assessment & Plan Note (Signed)
 Right periauricular area since cyst resection On Lamictal, well-controlled currently He has cold feeling, unclear if related to Metoprolol or Lamictal, denies claudication symptoms

## 2023-10-08 NOTE — Assessment & Plan Note (Signed)
 BP Readings from Last 1 Encounters:  10/08/23 138/74   Uncontrolled with Metoprolol  25 mg BID and Telmisartan  80 mg QD Restart HCTZ at 12.5 mg QD dose, had hypotension at 25 mg dose Counseled for compliance with the medications Advised DASH diet and moderate exercise/walking, at least 150 mins/week

## 2023-10-08 NOTE — Assessment & Plan Note (Signed)
 S/p cardiac ablation in 2006 On Metoprolol  25 mg BID Had EKG in 06/25, showed sinus rhythm without any acute signs of ischemia

## 2023-10-08 NOTE — Assessment & Plan Note (Signed)
 Started empiric Augmentin  as he has persistent symptoms despite symptomatic treatment Flonase  for nasal congestion/allergies Advised to use vaporizer and/or sinus inhaler for nasal congestion

## 2023-10-08 NOTE — Progress Notes (Signed)
 Established Patient Office Visit  Subjective:  Patient ID: [name], male    DOB: Sep 05, 1970  Age: 53 y.o. MRN: [id number]  CC:  Chief Complaint  Patient presents with   Hypertension    Follow up   Sinus Problem    Would like antibiotic for sinus , has facial pressure and head pressure.     HPI [name] is a 53 y.o. male with past medical history of PSVT s/p cardiac ablation, HTN, scalp cyst s/p excision, scalp neuralgia, GERD and HLD who presents for f/u of his chronic medical conditions.  He has history of PSVT s/p cardiac ablation.  He denies any chest pain, dyspnea or palpitations currently.  He takes telmisartan  80 mg QD regularly.  He is on metoprolol  25 mg BID for PSVT and HTN.  His BP was still elevated today.  He reports having elevated BP at home, around 140s/90s as well. He has stopped taking HCTZ due to hypotensive spell in 07/25.  He has history of a scalp cyst, which was excised by his dermatologist in the past.  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  Denies any dizziness, ear pain or ear discharge currently.  He has been taking Lamictal  for scalp neuralgia since then.  He reports nasal congestion, sinus pressure related headache and postnasal drip for the last 2 weeks. He has tried Flonase  without much relief.  Denies any fever or chills.  He had felt improvement in headache after taking Augmentin  in 07/25.       Past Medical History:  Diagnosis Date   Anxiety and depression    2009-suicidal ideation, involuntary commitment   Congestive heart failure (HCC) ?   Coronary artery disease    Gastroesophageal reflux disease    History of PSVT (paroxysmal supraventricular tachycardia)    s/p ablation 2006; occasional palpitations; occasional atypical left chest pain; event recorder in 03/2011-normal sinus, sinus tachycardia, single symptomatic spell with sinus rhythm   Hyperlipidemia    Intolerant to statins causing tongue swelling    Hypertension    Lab 02/2011: Normal CBC and BMet, TSH-1.6   Obstructive sleep apnea    Wears CPAP intermittantly. Followed by Dr. Brien Finn Pulmonology.    Past Surgical History:  Procedure Laterality Date   ANKLE SURGERY     Right   LEFT HEART CATHETERIZATION WITH CORONARY ANGIOGRAM N/A 02/21/2011   Procedure: LEFT HEART CATHETERIZATION WITH CORONARY ANGIOGRAM;  Surgeon: Ozell Fell, MD;  Location: [facility] CATH LAB;  Service: Cardiovascular;  Laterality: N/A;   NASAL TURBINATE REDUCTION     SHOULDER SURGERY     Right    Family History  Problem Relation Age of Onset   Heart disease Father    Heart attack Father 28   Heart disease Paternal Grandfather     Social History   Socioeconomic History   Marital status: Single    Spouse name: Not on file   Number of children: Not on file   Years of education: Not on file   Highest education level: Not on file  Occupational History   Occupation: Construction-grading  Tobacco Use   Smoking status: Every Day    Current packs/day: 1.00    Average packs/day: 1 pack/day for 36.3 years (36.3 ttl pk-yrs)    Types: Cigarettes    Start date: 06/30/1987   Smokeless tobacco: Never  Vaping Use   Vaping status: Never Used  Substance and Sexual Activity   Alcohol use: No  Alcohol/week: 0.0 standard drinks of alcohol   Drug use: No   Sexual activity: Not on file  Other Topics Concern   Not on file  Social History Narrative   Lives with ex girlfriend.  Grown daughters.  7 grands.     Social Drivers of Corporate investment banker Strain: Not on file  Food Insecurity: Not on file  Transportation Needs: Not on file  Physical Activity: Not on file  Stress: Not on file  Social Connections: Unknown (06/01/2021)   Received from [facility]   Social Network    Social Network: Not on file  Intimate Partner Violence: Unknown (04/24/2021)   Received from [facility]   HITS    Physically Hurt: Not on file    Insult or Talk Down To:  Not on file    Threaten Physical Harm: Not on file    Scream or Curse: Not on file    Outpatient Medications Prior to Visit  Medication Sig Dispense Refill   albuterol  (VENTOLIN  HFA) 108 (90 Base) MCG/ACT inhaler Inhale 2 puffs into the lungs every 6 (six) hours as needed for wheezing or shortness of breath. 8 g 2   esomeprazole (NEXIUM) 40 MG capsule Take 40 mg by mouth daily before breakfast.       meloxicam  (MOBIC ) 15 MG tablet Take 1 tablet (15 mg total) by mouth daily. 90 tablet 1   nitroGLYCERIN  (NITROSTAT ) 0.4 MG SL tablet Place 1 tablet (0.4 mg total) under the tongue every 5 (five) minutes as needed for chest pain. 25 tablet 3   telmisartan  (MICARDIS ) 80 MG tablet Take 1 tablet (80 mg total) by mouth daily. 90 tablet 1   traZODone  (DESYREL ) 50 MG tablet Take 0.5-1 tablets (25-50 mg total) by mouth at bedtime as needed for sleep. 30 tablet 3   atorvastatin  (LIPITOR) 40 MG tablet Take 1 tablet by mouth once daily 90 tablet 0   lamoTRIgine  (LAMICTAL ) 200 MG tablet TAKE 1 TABLET BY MOUTH ONCE DAILY.  APPOINTMENT REQUIRED FOR FUTURE REFILLS. 30 tablet 0   metoprolol  tartrate (LOPRESSOR ) 25 MG tablet Take 1 tablet by mouth twice daily 180 tablet 0   aspirin  EC 81 MG tablet Take 1 tablet (81 mg total) by mouth daily. Swallow whole. 90 tablet 3   hydrochlorothiazide  (HYDRODIURIL ) 25 MG tablet Take 1 tablet (25 mg total) by mouth daily. 30 tablet 2   methylPREDNISolone  (MEDROL  DOSEPAK) 4 MG TBPK tablet Take as package instructions. 1 each 0   No facility-administered medications prior to visit.    Allergies  Allergen Reactions   Statins Other (See Comments)    Causes flushing   Simvastatin Other (See Comments)    Caused flushing    ROS Review of Systems  Constitutional:  Negative for chills and fever.  HENT:  Positive for congestion, postnasal drip and sinus pressure. Negative for sore throat.   Eyes:  Negative for pain and discharge.  Respiratory:  Negative for cough and  shortness of breath.   Cardiovascular:  Negative for chest pain and palpitations.  Gastrointestinal:  Negative for constipation, diarrhea, nausea and vomiting.  Endocrine: Negative for polydipsia and polyuria.  Genitourinary:  Negative for dysuria and hematuria.  Musculoskeletal:  Negative for neck pain and neck stiffness.  Skin:  Negative for rash.  Neurological:  Positive for headaches (Around right ear/scalp area). Negative for dizziness, weakness and numbness.  Psychiatric/Behavioral:  Negative for agitation and behavioral problems.       Objective:    Physical  Exam Vitals reviewed.  Constitutional:      General: He is not in acute distress.    Appearance: He is not diaphoretic.  HENT:     Head: Normocephalic and atraumatic.     Nose: Congestion present.     Right Sinus: Maxillary sinus tenderness present.     Left Sinus: Maxillary sinus tenderness present.     Mouth/Throat:     Mouth: Mucous membranes are moist.  Eyes:     General: No scleral icterus.    Extraocular Movements: Extraocular movements intact.  Cardiovascular:     Rate and Rhythm: Normal rate and regular rhythm.     Pulses: Normal pulses.     Heart sounds: Normal heart sounds. No murmur heard. Pulmonary:     Breath sounds: Normal breath sounds. No wheezing or rales.  Musculoskeletal:     Cervical back: Neck supple. No tenderness.     Right lower leg: No edema.     Left lower leg: No edema.  Skin:    General: Skin is warm.     Findings: No rash.     Comments: Skin thickness over scalp area near right ear, s/p cyst excision  Neurological:     General: No focal deficit present.     Mental Status: He is alert and oriented to person, place, and time.  Psychiatric:        Mood and Affect: Mood normal.        Behavior: Behavior normal.     BP 138/74 (BP Location: Right Arm)   Pulse 85   Ht 6' (1.829 m)   Wt 242 lb 9.6 oz (110 kg)   SpO2 94%   BMI 32.90 kg/m  Wt Readings from Last 3 Encounters:   10/08/23 242 lb 9.6 oz (110 kg)  08/11/23 234 lb 9.6 oz (106.4 kg)  08/05/23 235 lb 12.8 oz (107 kg)    Lab Results  Component Value Date   TSH 2.050 08/05/2023   Lab Results  Component Value Date   WBC 11.4 (H) 08/05/2023   HGB 15.9 08/05/2023   HCT 47.5 08/05/2023   MCV 96 08/05/2023   PLT 260 08/05/2023   Lab Results  Component Value Date   NA 142 08/05/2023   K 3.8 08/05/2023   CO2 21 08/05/2023   GLUCOSE 86 08/05/2023   BUN 9 08/05/2023   CREATININE 0.95 08/05/2023   BILITOT 0.3 08/05/2023   ALKPHOS 102 08/05/2023   AST 16 08/05/2023   ALT 20 08/05/2023   PROT 6.6 08/05/2023   ALBUMIN 4.5 08/05/2023   CALCIUM  9.4 08/05/2023   ANIONGAP 13 10/28/2022   EGFR 96 08/05/2023   GFR 86.11 03/13/2011   Lab Results  Component Value Date   CHOL 150 08/05/2023   Lab Results  Component Value Date   HDL 30 (L) 08/05/2023   Lab Results  Component Value Date   LDLCALC 69 08/05/2023   Lab Results  Component Value Date   TRIG 320 (H) 08/05/2023   Lab Results  Component Value Date   CHOLHDL 5.0 08/05/2023   Lab Results  Component Value Date   HGBA1C 5.5 08/05/2023      Assessment & Plan:   Problem List Items Addressed This Visit    Problem List Items Addressed This Visit       Cardiovascular and Mediastinum   Hypertension - Primary (Chronic)   BP Readings from Last 1 Encounters:  10/08/23 138/74   Uncontrolled with Metoprolol  25 mg BID and  Telmisartan  80 mg QD Restart HCTZ at 12.5 mg QD dose, had hypotension at 25 mg dose Counseled for compliance with the medications Advised DASH diet and moderate exercise/walking, at least 150 mins/week      Relevant Medications   metoprolol  tartrate (LOPRESSOR ) 25 MG tablet   atorvastatin  (LIPITOR) 40 MG tablet   hydrochlorothiazide  (HYDRODIURIL ) 12.5 MG tablet     Respiratory   Acute non-recurrent maxillary sinusitis   Started empiric Augmentin  as he has persistent symptoms despite symptomatic  treatment Flonase  for nasal congestion/allergies Advised to use vaporizer and/or sinus inhaler for nasal congestion      Relevant Medications   amoxicillin -clavulanate (AUGMENTIN ) 875-125 MG tablet   fluticasone  (FLONASE ) 50 MCG/ACT nasal spray     Other   History of PSVT (paroxysmal supraventricular tachycardia) (Chronic)   S/p cardiac ablation in 2006 On Metoprolol  25 mg BID Had EKG in 06/25, showed sinus rhythm without any acute signs of ischemia      Relevant Medications   metoprolol  tartrate (LOPRESSOR ) 25 MG tablet   Neuralgia involving scalp (Chronic)   Right periauricular area since cyst resection On Lamictal , well-controlled currently He has cold feeling, unclear if related to Metoprolol  or Lamictal , denies claudication symptoms      Relevant Medications   lamoTRIgine  (LAMICTAL ) 200 MG tablet   Mixed hyperlipidemia   Check lipid profile On Lipitor 40 mg daily now Advised to take fish oil as well      Relevant Medications   metoprolol  tartrate (LOPRESSOR ) 25 MG tablet   atorvastatin  (LIPITOR) 40 MG tablet   hydrochlorothiazide  (HYDRODIURIL ) 12.5 MG tablet       Meds ordered this encounter  Medications   lamoTRIgine  (LAMICTAL ) 200 MG tablet    Sig: Take 1 tablet (200 mg total) by mouth daily.    Dispense:  90 tablet    Refill:  1   metoprolol  tartrate (LOPRESSOR ) 25 MG tablet    Sig: Take 1 tablet (25 mg total) by mouth 2 (two) times daily.    Dispense:  180 tablet    Refill:  3   atorvastatin  (LIPITOR) 40 MG tablet    Sig: Take 1 tablet (40 mg total) by mouth daily.    Dispense:  90 tablet    Refill:  3   DISCONTD: hydrochlorothiazide  (HYDRODIURIL ) 12.5 MG tablet    Sig: Take 1 tablet (12.5 mg total) by mouth daily.    Dispense:  45 tablet    Refill:  1   hydrochlorothiazide  (HYDRODIURIL ) 12.5 MG tablet    Sig: Take 1 tablet (12.5 mg total) by mouth daily.    Dispense:  90 tablet    Refill:  1   amoxicillin -clavulanate (AUGMENTIN ) 875-125 MG  tablet    Sig: Take 1 tablet by mouth 2 (two) times daily.    Dispense:  14 tablet    Refill:  0   fluticasone  (FLONASE ) 50 MCG/ACT nasal spray    Sig: Place 2 sprays into both nostrils daily.    Dispense:  16 g    Refill:  1    Follow-up: Return in about 4 months (around 02/07/2024) for HTN.    Suzzane MARLA Blanch, MD

## 2023-11-03 ENCOUNTER — Other Ambulatory Visit: Payer: Self-pay | Admitting: Internal Medicine

## 2023-11-03 DIAGNOSIS — G4752 REM sleep behavior disorder: Secondary | ICD-10-CM

## 2023-12-31 ENCOUNTER — Other Ambulatory Visit: Payer: Self-pay | Admitting: Internal Medicine

## 2023-12-31 DIAGNOSIS — I10 Essential (primary) hypertension: Secondary | ICD-10-CM

## 2024-01-08 ENCOUNTER — Emergency Department (HOSPITAL_COMMUNITY): Payer: Self-pay

## 2024-01-08 ENCOUNTER — Other Ambulatory Visit: Payer: Self-pay

## 2024-01-08 ENCOUNTER — Emergency Department (HOSPITAL_COMMUNITY): Admission: EM | Admit: 2024-01-08 | Discharge: 2024-01-09 | Payer: Self-pay

## 2024-01-08 DIAGNOSIS — M25511 Pain in right shoulder: Secondary | ICD-10-CM | POA: Insufficient documentation

## 2024-01-08 DIAGNOSIS — Z5321 Procedure and treatment not carried out due to patient leaving prior to being seen by health care provider: Secondary | ICD-10-CM | POA: Insufficient documentation

## 2024-01-08 DIAGNOSIS — M542 Cervicalgia: Secondary | ICD-10-CM | POA: Insufficient documentation

## 2024-01-08 LAB — BASIC METABOLIC PANEL WITH GFR
Anion gap: 12 (ref 5–15)
BUN: 14 mg/dL (ref 6–20)
CO2: 25 mmol/L (ref 22–32)
Calcium: 9 mg/dL (ref 8.9–10.3)
Chloride: 106 mmol/L (ref 98–111)
Creatinine, Ser: 1.28 mg/dL — ABNORMAL HIGH (ref 0.61–1.24)
GFR, Estimated: >60 mL/min (ref >60)
Glucose, Bld: 196 mg/dL — ABNORMAL HIGH (ref 70–99)
Potassium: 3.5 mmol/L (ref 3.5–5.1)
Sodium: 143 mmol/L (ref 135–145)

## 2024-01-08 LAB — CBC WITH DIFFERENTIAL/PLATELET
Abs Immature Granulocytes: 0.03 K/uL (ref 0.00–0.07)
Basophils Absolute: 0 K/uL (ref 0.0–0.1)
Basophils Relative: 0 %
Eosinophils Absolute: 0.2 K/uL (ref 0.0–0.5)
Eosinophils Relative: 2 %
HCT: 44.3 % (ref 39.0–52.0)
Hemoglobin: 15.5 g/dL (ref 13.0–17.0)
Immature Granulocytes: 0 %
Lymphocytes Relative: 28 %
Lymphs Abs: 2.8 K/uL (ref 0.7–4.0)
MCH: 32.4 pg (ref 26.0–34.0)
MCHC: 35 g/dL (ref 30.0–36.0)
MCV: 92.7 fL (ref 80.0–100.0)
Monocytes Absolute: 0.8 K/uL (ref 0.1–1.0)
Monocytes Relative: 8 %
Neutro Abs: 6 K/uL (ref 1.7–7.7)
Neutrophils Relative %: 62 %
Platelets: 272 K/uL (ref 150–400)
RBC: 4.78 MIL/uL (ref 4.22–5.81)
RDW: 13.5 % (ref 11.5–15.5)
WBC: 9.9 K/uL (ref 4.0–10.5)
nRBC: 0 % (ref 0.0–0.2)

## 2024-01-08 MED ORDER — IOHEXOL 350 MG/ML SOLN
75.0000 mL | Freq: Once | INTRAVENOUS | Status: AC | PRN
  Administered 2024-01-08: 75 mL via INTRAVENOUS

## 2024-01-08 MED ORDER — HYDROCODONE-ACETAMINOPHEN 5-325 MG PO TABS
1.0000 | ORAL_TABLET | Freq: Once | ORAL | Status: AC
  Administered 2024-01-08: 22:00:00 1 via ORAL
  Filled 2024-01-08: qty 1

## 2024-01-08 NOTE — ED Provider Triage Note (Signed)
 Emergency Medicine Provider Triage Evaluation Note  [name] , a 53 y.o. male  was evaluated in triage.  Pt complains of pain in his neck radiating down to his shoulder reported as tingling after working on a vehicle on the road today.  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  He reports after climbing out from the vehicle he had sudden exacerbation of pain with this new neurological pain radiating up into his neck.  Review of Systems  Positive: Chronic shoulder pain with new onset of Negative: Chest pain, shortness of breath  Physical Exam  BP (!) 171/100   Pulse 79   Temp 98.2 F (36.8 C)   Resp 16   SpO2 94%  Gen:   Awake, no distress   Resp:  Normal effort  MSK:   Patient has pain throughout his right shoulder, bilateral strength is equal, patient has new tingling pain in his arm radiating up into his shoulder. Other:    Medical Decision Making  Medically screening exam initiated at 10:06 PM.  Appropriate orders placed.  [name] was informed that the remainder of the evaluation will be completed by another provider, this initial triage assessment does not replace that evaluation, and the importance of remaining in the ED until their evaluation is complete.     Myriam Fonda RAMAN, PA-C 01/08/24 2209

## 2024-01-08 NOTE — ED Triage Notes (Signed)
 Pt complaining of right shoulder pain which he has a replacement surgery scheduled for. Tonight he was working on a car and felt a pain that went from the shoulder blade up into his neck.

## 2024-01-09 NOTE — ED Notes (Signed)
 Pt stated he was leaving AMA and going to his provider in the morning.

## 2024-01-10 ENCOUNTER — Encounter (HOSPITAL_COMMUNITY): Payer: Self-pay

## 2024-01-10 ENCOUNTER — Emergency Department (HOSPITAL_COMMUNITY)
Admission: EM | Admit: 2024-01-10 | Discharge: 2024-01-10 | Disposition: A | Discharge status: Home / Self Care | Payer: Self-pay | Attending: Emergency Medicine | Admitting: Emergency Medicine

## 2024-01-10 ENCOUNTER — Other Ambulatory Visit: Payer: Self-pay

## 2024-01-10 ENCOUNTER — Emergency Department (HOSPITAL_COMMUNITY): Payer: Self-pay

## 2024-01-10 DIAGNOSIS — M542 Cervicalgia: Secondary | ICD-10-CM

## 2024-01-10 DIAGNOSIS — I10 Essential (primary) hypertension: Secondary | ICD-10-CM | POA: Insufficient documentation

## 2024-01-10 DIAGNOSIS — M62838 Other muscle spasm: Secondary | ICD-10-CM | POA: Insufficient documentation

## 2024-01-10 DIAGNOSIS — Z79899 Other long term (current) drug therapy: Secondary | ICD-10-CM | POA: Insufficient documentation

## 2024-01-10 DIAGNOSIS — Z7982 Long term (current) use of aspirin: Secondary | ICD-10-CM | POA: Insufficient documentation

## 2024-01-10 MED ORDER — LIDOCAINE 5 % EX PTCH: 1.0000 | MEDICATED_PATCH | CUTANEOUS | 0 refills | Status: AC

## 2024-01-10 MED ORDER — LIDOCAINE 5 % EX PTCH
1.0000 | MEDICATED_PATCH | CUTANEOUS | Status: DC
  Administered 2024-01-10: 1 via TRANSDERMAL
  Filled 2024-01-10: qty 1

## 2024-01-10 MED ORDER — METHOCARBAMOL 500 MG PO TABS: 500.0000 mg | ORAL_TABLET | Freq: Four times a day (QID) | ORAL | 0 refills | Status: AC | PRN

## 2024-01-10 MED ORDER — PREDNISONE 20 MG PO TABS
40.0000 mg | ORAL_TABLET | Freq: Once | ORAL | Status: AC
  Administered 2024-01-10: 40 mg via ORAL
  Filled 2024-01-10: qty 2

## 2024-01-10 MED ORDER — KETOROLAC TROMETHAMINE 15 MG/ML IJ SOLN
15.0000 mg | Freq: Once | INTRAMUSCULAR | Status: AC
  Administered 2024-01-10: 15 mg via INTRAMUSCULAR
  Filled 2024-01-10: qty 1

## 2024-01-10 MED ORDER — PREDNISONE 20 MG PO TABS: 40.0000 mg | ORAL_TABLET | Freq: Every day | ORAL | 0 refills | Status: AC

## 2024-01-10 MED ORDER — METHOCARBAMOL 500 MG PO TABS
500.0000 mg | ORAL_TABLET | Freq: Once | ORAL | Status: AC
  Administered 2024-01-10: 500 mg via ORAL
  Filled 2024-01-10: qty 1

## 2024-01-10 NOTE — Discharge Instructions (Signed)
 It is a pleasure to take care of you today.  You were seen for pain in your neck.  Your x-rays did show some degenerative changes but no fractures or misalignment.  This will possibly have a problem with the desk causing your pain versus muscle strain and path spasm.  I prescribed you steroids as well as muscle relaxers and lidocaine  patches.  Follow-up with your orthopedic doctor, come back to the ER for new or worsening symptoms.

## 2024-01-10 NOTE — ED Triage Notes (Signed)
 Pt c/o ongoing neck pain on R side of neck and on the back of neck, pt states he is supposed to have complete shoulder surgery in the future but his doctor told him to come up here as he could get an x ray of my neck to look for some ruptured disc rates neck pain 8/10, states tylenol  hasn't helped with pain

## 2024-01-10 NOTE — ED Provider Notes (Signed)
 " [facility] AT [facility] Provider Note   CSN: [id number] Arrival date & time: 01/10/24  9145     Patient presents with: Neck Pain   [name] is a 53 y.o. male.  History of chronic right shoulder pain, being evaluated for replacement by orthopedics.  Also history of hypertension, SVT, sleep apnea and cervical degenerative disc disease.  He presents to the ER for evaluation today of right sided neck pain that started 2 nights ago, he states he was lifting something heavy under a truck at work and felt immediate pain in the neck radiating down the side of the neck.  He has chronic pain rating down to his right arm from his chronic shoulder pain and this is unchanged.  He denies any weakness in his arms, he did not have any trauma to his neck or head.  He is not on blood thinners.  He has been taking Tylenol  without relief.  He called and spoke with his orthopedic doctor who is going to have him follow-up with one of the spine doctors in their office but advised, the ER for further evaluation.    Neck Pain      Prior to Admission medications  Medication Sig Start Date End Date Taking? Authorizing Provider  albuterol  (VENTOLIN  HFA) 108 (90 Base) MCG/ACT inhaler Inhale 2 puffs into the lungs every 6 (six) hours as needed for wheezing or shortness of breath. 01/23/22   Del Orbe Polanco, Iliana, FNP  amoxicillin -clavulanate (AUGMENTIN ) 875-125 MG tablet Take 1 tablet by mouth 2 (two) times daily. 10/08/23   [name], MD  aspirin  EC 81 MG tablet Take 1 tablet (81 mg total) by mouth daily. Swallow whole. 05/10/21   Lynwood Schilling, MD  atorvastatin  (LIPITOR) 40 MG tablet Take 1 tablet (40 mg total) by mouth daily. 10/08/23   [name], MD  esomeprazole (NEXIUM) 40 MG capsule Take 40 mg by mouth daily before breakfast.      [provider]  fluticasone  (FLONASE ) 50 MCG/ACT nasal spray Place 2 sprays into both nostrils daily. 10/08/23   [name], MD  hydrochlorothiazide  (HYDRODIURIL ) 12.5 MG tablet Take 1 tablet (12.5 mg total) by mouth daily. 10/08/23   [name], MD  lamoTRIgine  (LAMICTAL ) 200 MG tablet Take 1 tablet (200 mg total) by mouth daily. 10/08/23   [name], MD  meloxicam  (MOBIC ) 15 MG tablet Take 1 tablet (15 mg total) by mouth daily. 07/08/23   [name], MD  metoprolol  tartrate (LOPRESSOR ) 25 MG tablet Take 1 tablet (25 mg total) by mouth 2 (two) times daily. 10/08/23   [name], MD  nitroGLYCERIN  (NITROSTAT ) 0.4 MG SL tablet Place 1 tablet (0.4 mg total) under the tongue every 5 (five) minutes as needed for chest pain. 11/01/22 10/08/23  Del Wilhelmena Lloyd Sola, FNP  telmisartan  (MICARDIS ) 80 MG tablet Take 1 tablet by mouth once daily 12/31/23   Patel, Rutwik K, MD  traZODone  (DESYREL ) 50 MG tablet TAKE 1/2 TO 1 (ONE-HALF TO ONE) TABLET BY MOUTH AT BEDTIME AS NEEDED FOR SLEEP 11/03/23   [name], MD    Allergies: Statins and Simvastatin    Review of Systems  Musculoskeletal:  Positive for neck pain.    Updated Vital Signs BP (!) 153/91 (BP Location: Right Arm)   Pulse 61   Temp 98 F (36.7 C) (Oral)   Resp 18   SpO2 93%   Physical Exam Vitals  and nursing note reviewed.  Constitutional:      General: He is not in acute distress.    Appearance: He is well-developed.  HENT:     Head: Normocephalic and atraumatic.     Mouth/Throat:     Mouth: Mucous membranes are moist.  Eyes:     Extraocular Movements: Extraocular movements intact.     Conjunctiva/sclera: Conjunctivae normal.  Cardiovascular:     Rate and Rhythm: Normal rate and regular rhythm.     Heart sounds: No murmur heard. Pulmonary:     Effort: Pulmonary effort is normal. No respiratory distress.     Breath sounds: Normal breath sounds.  Abdominal:     Palpations: Abdomen is soft.     Tenderness: There is no abdominal tenderness.  Musculoskeletal:        General: No swelling.     Cervical back: Neck  supple.     Comments: Tenderness to the right cervical paraspinous muscles with some palpable spasm in the right trapezius area.  Patient can actively rotate to about 30 degrees to the right and can rotate past 45 degrees on the left.  Rotation limited due to pain.  He does not have any radiation of the pain on rotation.  Skin:    General: Skin is warm and dry.     Capillary Refill: Capillary refill takes less than 2 seconds.  Neurological:     General: No focal deficit present.     Mental Status: He is alert and oriented to person, place, and time.     Motor: No weakness.     Comments: Strength out of 5 in bilateral upper extremities, sensation grossly intact to light touch in the upper extremities bilaterally.  Psychiatric:        Mood and Affect: Mood normal.     (all labs ordered are listed, but only abnormal results are displayed) Labs Reviewed - No data to display  EKG: None  Radiology: DG Cervical Spine Complete Result Date: 01/10/2024 CLINICAL DATA:  Neck pain. EXAM: CERVICAL SPINE - COMPLETE 4+ VIEW COMPARISON:  None Available. FINDINGS: No acute fracture or subluxation of the cervical spine. The vertebral body heights are maintained. Mild degenerative changes primarily at C5-C6. The visualized posterior elements and odontoid appear intact. There is anatomic alignment of the lateral masses of C1 and C2. The soft tissues are unremarkable. IMPRESSION: 1. No acute findings. 2. Mild degenerative changes. Electronically Signed   By: Vanetta Chou M.D.   On: 01/10/2024 10:30   CT ANGIO HEAD NECK W WO CM Result Date: 01/09/2024 CLINICAL DATA:  Initial evaluation for numbness and tingling in neck radiating to arm. Laterality not specified. EXAM: CT ANGIOGRAPHY HEAD AND NECK WITH AND WITHOUT CONTRAST TECHNIQUE: Multidetector CT imaging of the head and neck was performed using the standard protocol during bolus administration of intravenous contrast. Multiplanar CT image reconstructions  and MIPs were obtained to evaluate the vascular anatomy. Carotid stenosis measurements (when applicable) are obtained utilizing NASCET criteria, using the distal internal carotid diameter as the denominator. RADIATION DOSE REDUCTION: This exam was performed according to the departmental dose-optimization program which includes automated exposure control, adjustment of the mA and/or kV according to patient size and/or use of iterative reconstruction technique. CONTRAST:  75mL OMNIPAQUE  IOHEXOL  350 MG/ML SOLN COMPARISON:  Prior exam from 09/17/2022 FINDINGS: CT HEAD FINDINGS Brain: Cerebral volume within normal limits for patient age. No acute intracranial hemorrhage. No acute large vessel territory infarct. No mass lesion, midline shift, or mass  effect. Ventricles are normal in size without hydrocephalus. No extra-axial fluid collection. Vascular: No abnormal hyperdense vessel. Skull: Scalp soft tissues demonstrate no acute abnormality. Calvarium intact. Sinuses/Orbits: Globes and orbital soft tissues within normal limits. Visualized paranasal sinuses are largely clear. No significant mastoid effusion. CTA NECK FINDINGS Aortic arch: Standard branching. Imaged portion shows no evidence of aneurysm or dissection. No significant stenosis of the major arch vessel origins. Right carotid system: No evidence of dissection, stenosis (50% or greater), or occlusion. Left carotid system: No evidence of dissection, stenosis (50% or greater), or occlusion. Vertebral arteries: No evidence of dissection, stenosis (50% or greater), or occlusion. Skeleton: No discrete or worrisome osseous lesions. Other neck: No other acute finding. Upper chest: No other acute finding. Review of the MIP images confirms the above findings CTA HEAD FINDINGS Anterior circulation: Mild atheromatous change about the carotid siphons without hemodynamically significant stenosis. A1 segments, anterior communicating artery complex, and anterior cerebral  arteries patent without stenosis. No M1 stenosis or occlusion. Distal MCA branches perfused and symmetric. Posterior circulation: Both V4 segments patent without stenosis. Right vertebral artery dominant. Right PICA patent. Left PICA origin not well seen. Basilar patent without stenosis. Superior cerebral arteries patent bilaterally. Right PCA supplied via the basilar. Fetal type origin left PCA. Both PCAs patent without stenosis. Venous sinuses: Patent allowing for timing the contrast bolus. Anatomic variants: As above.  No aneurysm. Review of the MIP images confirms the above findings IMPRESSION: 1. Normal CTA of the head and neck. No large vessel occlusion, hemodynamically significant stenosis, or other acute vascular abnormality. 2. No other acute intracranial abnormality. Electronically Signed   By: Morene Hoard M.D.   On: 01/09/2024 00:15     Procedures   Medications Ordered in the ED  methocarbamol  (ROBAXIN ) tablet 500 mg (has no administration in time range)  ketorolac  (TORADOL ) 15 MG/ML injection 15 mg (has no administration in time range)  predniSONE  (DELTASONE ) tablet 40 mg (has no administration in time range)                                    Medical Decision Making Differential diagnosis includes but not limited to fracture, subluxation, degenerative disc disease, muscle strain, contusion, HNP, other  ED course: Patient presents with right-sided neck pain for the past couple of days after an injury at work.  He denies any direct trauma to the neck, x-rays showed no acute findings but he does have some degenerative changes.  The radiation to his right arm is unchanged from previous and he has no weakness in his extremities, there are no signs or symptoms of cord compression on history or exam.  Feel patient needs a further workup.  We will treat his pain in the ED with Toradol  and Norflex and give a steroid burst and have him follow-up with orthopedics.  He was given  strict return precautions.  Amount and/or Complexity of Data Reviewed External Data Reviewed: radiology and notes.    Details: ED triage note from 12/18, CTA head and neck from that visit that showed no acute findings Radiology: ordered and independent interpretation performed.    Details: Cervical spine imaging shows no fracture or malalignment, mild generative changes at C5 and C6, I agree with radiology reading  Risk Prescription drug management.        Final diagnoses:  None    ED Discharge Orders     None  Suellen Sherran DELENA DEVONNA 01/10/24 1107    Dean Clarity, MD 01/10/24 1658  "

## 2024-02-09 ENCOUNTER — Ambulatory Visit: Admitting: Internal Medicine

## 2024-03-02 ENCOUNTER — Ambulatory Visit: Admitting: Internal Medicine
